# Patient Record
Sex: Female | Born: 1960 | Race: White | Hispanic: No | Smoking: Never smoker
Health system: Southern US, Community
[De-identification: ages and names within clinical notes are randomized; demographics above are authoritative.]

## PROBLEM LIST (undated history)

## (undated) DIAGNOSIS — I951 Orthostatic hypotension: Secondary | ICD-10-CM

## (undated) DIAGNOSIS — M7542 Impingement syndrome of left shoulder: Secondary | ICD-10-CM

## (undated) DIAGNOSIS — F32A Depression, unspecified: Secondary | ICD-10-CM

## (undated) DIAGNOSIS — I471 Supraventricular tachycardia, unspecified: Secondary | ICD-10-CM

## (undated) DIAGNOSIS — C801 Malignant (primary) neoplasm, unspecified: Secondary | ICD-10-CM

## (undated) DIAGNOSIS — K219 Gastro-esophageal reflux disease without esophagitis: Secondary | ICD-10-CM

## (undated) DIAGNOSIS — F329 Major depressive disorder, single episode, unspecified: Secondary | ICD-10-CM

## (undated) DIAGNOSIS — M1990 Unspecified osteoarthritis, unspecified site: Secondary | ICD-10-CM

## (undated) DIAGNOSIS — M720 Palmar fascial fibromatosis [Dupuytren]: Secondary | ICD-10-CM

## (undated) DIAGNOSIS — R0789 Other chest pain: Secondary | ICD-10-CM

## (undated) DIAGNOSIS — T50902A Poisoning by unspecified drugs, medicaments and biological substances, intentional self-harm, initial encounter: Secondary | ICD-10-CM

## (undated) DIAGNOSIS — I81 Portal vein thrombosis: Secondary | ICD-10-CM

## (undated) DIAGNOSIS — R55 Syncope and collapse: Secondary | ICD-10-CM

## (undated) HISTORY — PX: APPENDECTOMY: SHX54

## (undated) HISTORY — DX: Depression, unspecified: F32.A

## (undated) HISTORY — DX: Other chest pain: R07.89

## (undated) HISTORY — DX: Portal vein thrombosis: I81

## (undated) HISTORY — PX: COLONOSCOPY: SHX174

## (undated) HISTORY — DX: Syncope and collapse: R55

## (undated) HISTORY — DX: Supraventricular tachycardia: I47.1

## (undated) HISTORY — PX: TONSILLECTOMY: SUR1361

## (undated) HISTORY — DX: Supraventricular tachycardia, unspecified: I47.10

## (undated) HISTORY — PX: FOOT SURGERY: SHX648

## (undated) HISTORY — PX: SHOULDER SURGERY: SHX246

## (undated) HISTORY — DX: Palmar fascial fibromatosis (dupuytren): M72.0

## (undated) HISTORY — DX: Malignant (primary) neoplasm, unspecified: C80.1

## (undated) HISTORY — DX: Orthostatic hypotension: I95.1

## (undated) HISTORY — DX: Unspecified osteoarthritis, unspecified site: M19.90

## (undated) HISTORY — DX: Major depressive disorder, single episode, unspecified: F32.9

---

## 1998-02-20 HISTORY — PX: ABDOMINAL HYSTERECTOMY: SHX81

## 2000-03-30 ENCOUNTER — Other Ambulatory Visit: Admission: RE | Admit: 2000-03-30 | Discharge: 2000-03-30 | Payer: Self-pay | Admitting: Obstetrics and Gynecology

## 2003-01-12 ENCOUNTER — Emergency Department (HOSPITAL_COMMUNITY): Admission: EM | Admit: 2003-01-12 | Discharge: 2003-01-13 | Payer: Self-pay | Admitting: Emergency Medicine

## 2004-07-05 ENCOUNTER — Other Ambulatory Visit: Admission: RE | Admit: 2004-07-05 | Discharge: 2004-07-05 | Payer: Self-pay | Admitting: Obstetrics and Gynecology

## 2005-05-31 ENCOUNTER — Emergency Department (HOSPITAL_COMMUNITY): Admission: EM | Admit: 2005-05-31 | Discharge: 2005-06-01 | Payer: Self-pay | Admitting: Emergency Medicine

## 2006-04-05 ENCOUNTER — Emergency Department (HOSPITAL_COMMUNITY): Admission: EM | Admit: 2006-04-05 | Discharge: 2006-04-06 | Payer: Self-pay | Admitting: Emergency Medicine

## 2006-09-01 ENCOUNTER — Emergency Department (HOSPITAL_COMMUNITY): Admission: EM | Admit: 2006-09-01 | Discharge: 2006-09-01 | Payer: Self-pay | Admitting: Emergency Medicine

## 2009-10-04 ENCOUNTER — Emergency Department (HOSPITAL_COMMUNITY): Admission: EM | Admit: 2009-10-04 | Discharge: 2009-10-04 | Payer: Self-pay | Admitting: Emergency Medicine

## 2009-12-10 ENCOUNTER — Ambulatory Visit (HOSPITAL_COMMUNITY): Admission: RE | Admit: 2009-12-10 | Discharge: 2009-12-10 | Payer: Self-pay | Admitting: Emergency Medicine

## 2009-12-10 ENCOUNTER — Encounter: Payer: Self-pay | Admitting: Emergency Medicine

## 2009-12-10 ENCOUNTER — Ambulatory Visit: Payer: Self-pay | Admitting: Vascular Surgery

## 2010-08-11 ENCOUNTER — Encounter: Payer: Self-pay | Admitting: Family Medicine

## 2010-08-11 ENCOUNTER — Ambulatory Visit (INDEPENDENT_AMBULATORY_CARE_PROVIDER_SITE_OTHER): Payer: BC Managed Care – PPO | Admitting: Family Medicine

## 2010-08-11 DIAGNOSIS — M79671 Pain in right foot: Secondary | ICD-10-CM | POA: Insufficient documentation

## 2010-08-11 DIAGNOSIS — M79609 Pain in unspecified limb: Secondary | ICD-10-CM

## 2010-08-11 DIAGNOSIS — C539 Malignant neoplasm of cervix uteri, unspecified: Secondary | ICD-10-CM

## 2010-08-11 DIAGNOSIS — M2669 Other specified disorders of temporomandibular joint: Secondary | ICD-10-CM

## 2010-08-11 DIAGNOSIS — F32A Depression, unspecified: Secondary | ICD-10-CM | POA: Insufficient documentation

## 2010-08-11 DIAGNOSIS — G43109 Migraine with aura, not intractable, without status migrainosus: Secondary | ICD-10-CM

## 2010-08-11 DIAGNOSIS — R29898 Other symptoms and signs involving the musculoskeletal system: Secondary | ICD-10-CM

## 2010-08-11 DIAGNOSIS — M549 Dorsalgia, unspecified: Secondary | ICD-10-CM

## 2010-08-11 DIAGNOSIS — G43909 Migraine, unspecified, not intractable, without status migrainosus: Secondary | ICD-10-CM | POA: Insufficient documentation

## 2010-08-11 DIAGNOSIS — F329 Major depressive disorder, single episode, unspecified: Secondary | ICD-10-CM

## 2010-08-11 DIAGNOSIS — G4700 Insomnia, unspecified: Secondary | ICD-10-CM | POA: Insufficient documentation

## 2010-08-11 NOTE — Patient Instructions (Signed)
Don't change your meds for now.  I'll look over your records from Dr. Lajoyce Corners, Dr. Mila Homer and the [place].  Take care.

## 2010-08-12 ENCOUNTER — Encounter: Payer: Self-pay | Admitting: Family Medicine
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
## 2010-08-12 NOTE — Assessment & Plan Note (Signed)
Jaw rest, no other intervention.

## 2010-08-12 NOTE — Assessment & Plan Note (Signed)
Per psych, okay for outpatient f/u .

## 2010-08-12 NOTE — Assessment & Plan Note (Signed)
D/w pt. She was prev on an unrecalled med, she'll check on this.  Now with 1-2 HA per month, which is improved.

## 2010-08-12 NOTE — Assessment & Plan Note (Signed)
Per psych 

## 2010-08-12 NOTE — Assessment & Plan Note (Signed)
Per ortho.  

## 2010-11-22 ENCOUNTER — Encounter: Payer: Self-pay | Admitting: Family Medicine

## 2010-11-22 ENCOUNTER — Ambulatory Visit (INDEPENDENT_AMBULATORY_CARE_PROVIDER_SITE_OTHER): Payer: BC Managed Care – PPO | Admitting: Family Medicine

## 2010-11-22 VITALS — BP 120/70 | HR 80 | Temp 98.4°F | Ht 67.0 in | Wt 169.0 lb

## 2010-11-22 DIAGNOSIS — M7500 Adhesive capsulitis of unspecified shoulder: Secondary | ICD-10-CM

## 2010-11-22 MED ORDER — DICLOFENAC SODIUM 75 MG PO TBEC: 75.0000 mg | DELAYED_RELEASE_TABLET | Freq: Two times a day (BID) | ORAL | Status: DC

## 2010-11-22 MED ORDER — TRAMADOL HCL 50 MG PO TABS: 50.0000 mg | ORAL_TABLET | Freq: Four times a day (QID) | ORAL | Status: AC | PRN

## 2010-11-22 NOTE — Patient Instructions (Signed)
Recheck 6 weeks  REFERRAL: GO THE THE FRONT ROOM AT THE ENTRANCE OF OUR CLINIC, NEAR CHECK IN. ASK FOR MARION. SHE WILL HELP YOU SET UP YOUR REFERRAL. DATE: TIME:

## 2010-11-22 NOTE — Progress Notes (Signed)
  Subjective:    Patient ID: April Stephenson, female    DOB: 05-22-1960, 50 y.o.   MRN: [id number]  HPI  April Stephenson, a 50 y.o. female presents today in the office for the following:    08/2010 -- was having some problems with her left shoulder. Went to UC.  Has been getting worse since then -- has a sensation of subluxation.  Trouble lifting her arm up.   The other one is starting to do the same thing. Lifting her shoulder will hurt.  Gave some mobic at [facility] and also went on two weeks of vacation.   And went tubing and got a concussion and injured her left finger at the time.   He is primarily left greater than right shoulder. Notable restriction in motion with abduction, flexion, and mostly with internal range of motion, but also some with external rotation. She is having pain at night, and pain is waking her up at night.  The PMH, PSH, Social History, Family History, Medications, and allergies have been reviewed in [facility], and have been updated if relevant.   Review of Systems REVIEW OF SYSTEMS  GEN: No fevers, chills. Nontoxic. Primarily MSK c/o today. MSK: Detailed in the HPI GI: tolerating PO intake without difficulty Neuro: No numbness, parasthesias, or tingling associated. Otherwise the pertinent positives of the ROS are noted above.      Objective:   Physical Exam  Physical Exam  Blood pressure 120/70, pulse 80, temperature 98.4 F (36.9 C), temperature source Oral, height 5\' 7"  (1.702 m), weight 169 lb (76.658 kg), SpO2 97.00%.  GEN: Well-developed,well-nourished,in no acute distress; alert,appropriate and cooperative throughout examination HEENT: Normocephalic and atraumatic without obvious abnormalities. Ears, externally no deformities PULM: Breathing comfortably in no respiratory distress EXT: No clubbing, cyanosis, or edema PSYCH: Normally interactive. Cooperative during the interview. Pleasant. Friendly and conversant. Not anxious or depressed appearing.  Normal, full affect.  Shoulder: L Ecchymosis/edema: neg  AC joint, scapula, clavicle: NT Cervical spine: NT Abduction: 5/5, loss of 20 Flexion: 5/5, loss of 40 IR done at 90 degrees of abduction, 5/5: none ER done at 90 degrees of aduction: 5/5: 80 Special testing limited by lack of motion Drop Test: neg Supraspinatus insertion: NT Bicipital groove: NT C5-T1 intact Sensation intact Grip 5/5       Assessment & Plan:   1. Frozen shoulder  diclofenac (VOLTAREN) 75 MG EC tablet, traMADol (ULTRAM) 50 MG tablet, Ambulatory referral to Physical Therapy    Diagnosis: adhesive capsulitis  Patient was given a systematic ROM protocol from [facility] to be done daily. Emphasized that without adherence to HEP, likely will not get better.  Tylenol or NSAID of choice prn for pain relief  Patient will be sent for formal PT for aggressive frozen shoulder ROM. Will need RTC str and scapular stabilization to fix underlying mechanics.  If does poorly, intraarticular corticosteroid injections in Adhesive Capsulitis has clearly been shown to be of benefit - she is reluctant right now, and reasonable to cont with HEP, PT, meds.

## 2010-11-28 ENCOUNTER — Telehealth: Payer: Self-pay | Admitting: Family Medicine

## 2010-11-28 NOTE — Telephone Encounter (Signed)
Left message on patient's VM asking her to return the call.

## 2010-11-28 NOTE — Telephone Encounter (Signed)
Please notify pt that I still haven't gotten records from Dr. Lajoyce Corners, Dr. Mila Homer and the [place].  See if these can be obtained. Thanks.

## 2010-11-30 NOTE — Telephone Encounter (Signed)
Left message for patient to return call on home #.

## 2010-12-02 ENCOUNTER — Encounter: Payer: Self-pay | Admitting: *Deleted

## 2010-12-02 NOTE — Telephone Encounter (Signed)
Letter mailed to patient.

## 2010-12-07 ENCOUNTER — Telehealth: Payer: Self-pay | Admitting: *Deleted

## 2010-12-07 NOTE — Telephone Encounter (Signed)
Please call in  Vicodin 5-500, 1 tab q 4-6 hours prn pain. #50. 0 refills.  Put in chart. Cannot take and drive

## 2010-12-07 NOTE — Telephone Encounter (Signed)
Pt was given tramadol on 10/2 for frozen shoulder.  She has already used this up and she is asking if she can have something a little stronger called to [address].  She is still in a lot of pain, pain is unbearable when getting therapy and afterwards.  She cant sleep at night, or lay down, she sleeps in a recliner.

## 2010-12-08 MED ORDER — HYDROCODONE-ACETAMINOPHEN 5-500 MG PO TABS: 1.0000 | ORAL_TABLET | Freq: Four times a day (QID) | ORAL | Status: DC | PRN

## 2010-12-08 NOTE — Telephone Encounter (Signed)
Patient advised and rx called to pharmacy  

## 2010-12-21 ENCOUNTER — Encounter: Payer: Self-pay | Admitting: Family Medicine

## 2010-12-21 ENCOUNTER — Other Ambulatory Visit: Payer: Self-pay | Admitting: *Deleted

## 2010-12-21 ENCOUNTER — Ambulatory Visit (INDEPENDENT_AMBULATORY_CARE_PROVIDER_SITE_OTHER)
Admission: RE | Admit: 2010-12-21 | Discharge: 2010-12-21 | Disposition: A | Discharge status: Home / Self Care | Payer: BC Managed Care – PPO | Source: Ambulatory Visit | Attending: Family Medicine | Admitting: Family Medicine

## 2010-12-21 ENCOUNTER — Ambulatory Visit (INDEPENDENT_AMBULATORY_CARE_PROVIDER_SITE_OTHER): Payer: BC Managed Care – PPO | Admitting: Family Medicine

## 2010-12-21 VITALS — BP 120/70 | HR 76 | Temp 97.8°F | Ht 67.0 in | Wt 167.8 lb

## 2010-12-21 DIAGNOSIS — M25519 Pain in unspecified shoulder: Secondary | ICD-10-CM

## 2010-12-21 DIAGNOSIS — M25512 Pain in left shoulder: Secondary | ICD-10-CM

## 2010-12-21 DIAGNOSIS — M7500 Adhesive capsulitis of unspecified shoulder: Secondary | ICD-10-CM

## 2010-12-21 MED ORDER — HYDROCODONE-ACETAMINOPHEN 5-500 MG PO TABS: 1.0000 | ORAL_TABLET | Freq: Four times a day (QID) | ORAL | Status: DC | PRN

## 2010-12-21 NOTE — Patient Instructions (Signed)
REFERRAL: GO THE THE FRONT ROOM AT THE ENTRANCE OF OUR CLINIC, NEAR CHECK IN. ASK FOR MARION. SHE WILL HELP YOU SET UP YOUR REFERRAL. DATE: TIME:  

## 2010-12-21 NOTE — Telephone Encounter (Signed)
Ok to refill #50, 0 refills

## 2010-12-21 NOTE — Telephone Encounter (Signed)
rx called to pharmacy 

## 2010-12-21 NOTE — Progress Notes (Signed)
Subjective:    Patient ID: April Stephenson, female    DOB: [birth year], 50 y.o.   MRN: [id number]  HPI  April Stephenson, a 50 y.o. female presents today in the office for the following:    The patient is following up for left-sided greater than right-sided shoulder pain. She did sustain an injury possibly when she was tubing in July, 2012. She did fracture her finger at that time and had a significant concussion. Her memory surrounding that time is somewhat hazy, but afterwards she did develop some shoulder pain and restriction in motion. Last time when I saw her, we felt that she was having some of these capsulitis, initiated some range of motion. She would've formal physical therapy a few times, but has had some significant pain laterally, requiring Vicodin a couple times a day. Restriction in motion persists on the left side, and she is having some beginnings of pain with abduction and restriction in internal range of motion on the right.  Adhesive capsulitis -  L - f/u. Now a little discomfort on R  11/22/2010 OV: 08/2010 -- was having some problems with her left shoulder. Went to UC.   Has been getting worse since then -- has a sensation of subluxation.   Trouble lifting her arm up.   The other one is starting to do the same thing. Lifting her shoulder will hurt.   Gave some mobic at [facility] and also went on two weeks of vacation.   And went tubing and got a concussion and injured her left finger at the time.   He is primarily left greater than right shoulder. Notable restriction in motion with abduction, flexion, and mostly with internal range of motion, but also some with external rotation. She is having pain at night, and pain is waking her up at night.   The PMH, PSH, Social History, Family History, Medications, and allergies have been reviewed in [facility], and have been updated if relevant.   Review of Systems REVIEW OF SYSTEMS  GEN: No fevers, chills. Nontoxic. Primarily MSK c/o  today. MSK: Detailed in the HPI GI: tolerating PO intake without difficulty Neuro: No numbness, parasthesias, or tingling associated. Otherwise the pertinent positives of the ROS are noted above.      Objective:   Physical Exam   GEN: Well-developed,well-nourished,in no acute distress; alert,appropriate and cooperative throughout examination HEENT: Normocephalic and atraumatic without obvious abnormalities. Ears, externally no deformities PULM: Breathing comfortably in no respiratory distress EXT: No clubbing, cyanosis, or edema PSYCH: Normally interactive. Cooperative during the interview. Pleasant. Friendly and conversant. Not anxious or depressed appearing. Normal, full affect.  Shoulder: L Ecchymosis/edema: neg   AC joint, scapula, clavicle: NT Cervical spine: NT Abduction: 4/5, worsening to 110 degrees Flexion: 4+/5, loss of 80 deg IR done at 90 degrees of abduction, 5/5: none ER done at 90 degrees of aduction: 5/5: 30 degrees Special testing limited by lack of motion Drop Test: neg Supraspinatus insertion: NT Bicipital groove: NT C5-T1 intact Sensation intact Grip 5/5     Assessment & Plan:   1. Left shoulder pain  DG Shoulder Left, MR Shoulder Left Wo Contrast    Persistent left-sided sure pain. Worsening with regards to range of motion and progressing presumed adhesive capsulitis. Clinical concern given concussion and potential injury in July, 2012 and limitation in muscle function at the rotator cuff the patient has sustained a rotator cuff tear. Will obtain plain films, then obtained an MRI of the left shoulder to evaluate  for rotator cuff tear.  She's also having some mild degree of impingement and relative loss of internal range of motion on the right side.

## 2010-12-23 ENCOUNTER — Ambulatory Visit
Admission: RE | Admit: 2010-12-23 | Discharge: 2010-12-23 | Disposition: A | Discharge status: Home / Self Care | Payer: BC Managed Care – PPO | Source: Ambulatory Visit | Attending: Family Medicine | Admitting: Family Medicine

## 2010-12-23 DIAGNOSIS — M25512 Pain in left shoulder: Secondary | ICD-10-CM

## 2010-12-26 ENCOUNTER — Other Ambulatory Visit: Payer: Self-pay | Admitting: Family Medicine

## 2010-12-26 DIAGNOSIS — M7500 Adhesive capsulitis of unspecified shoulder: Secondary | ICD-10-CM

## 2010-12-26 DIAGNOSIS — M75100 Unspecified rotator cuff tear or rupture of unspecified shoulder, not specified as traumatic: Secondary | ICD-10-CM

## 2010-12-26 DIAGNOSIS — S43439A Superior glenoid labrum lesion of unspecified shoulder, initial encounter: Secondary | ICD-10-CM

## 2011-01-02 ENCOUNTER — Other Ambulatory Visit: Payer: Self-pay | Admitting: *Deleted

## 2011-01-02 DIAGNOSIS — M7500 Adhesive capsulitis of unspecified shoulder: Secondary | ICD-10-CM

## 2011-01-02 MED ORDER — DICLOFENAC SODIUM 75 MG PO TBEC: 75.0000 mg | DELAYED_RELEASE_TABLET | Freq: Two times a day (BID) | ORAL | Status: DC

## 2011-01-02 MED ORDER — HYDROCODONE-ACETAMINOPHEN 5-500 MG PO TABS: 1.0000 | ORAL_TABLET | Freq: Four times a day (QID) | ORAL | Status: DC | PRN

## 2011-01-02 NOTE — Telephone Encounter (Signed)
Medication phoned to pharmacy.  

## 2011-01-02 NOTE — Telephone Encounter (Signed)
Pt is traveling through [place] and has run out of her vicodin and diclofenac.  She will be returning home tomorrow, but is in a lot of pain and really needs these meds.  She wants these sent to [facility] in [place], on [address] in [place], [place], [address].  Phone number is [phone].

## 2011-01-02 NOTE — Telephone Encounter (Signed)
Please call in.  See phone info below.  Thanks.

## 2011-01-09 ENCOUNTER — Encounter: Payer: Self-pay | Admitting: Family Medicine

## 2011-01-09 ENCOUNTER — Ambulatory Visit (INDEPENDENT_AMBULATORY_CARE_PROVIDER_SITE_OTHER): Payer: BC Managed Care – PPO | Admitting: Family Medicine

## 2011-01-09 DIAGNOSIS — M75102 Unspecified rotator cuff tear or rupture of left shoulder, not specified as traumatic: Secondary | ICD-10-CM

## 2011-01-09 DIAGNOSIS — S43429A Sprain of unspecified rotator cuff capsule, initial encounter: Secondary | ICD-10-CM

## 2011-01-09 DIAGNOSIS — M7500 Adhesive capsulitis of unspecified shoulder: Secondary | ICD-10-CM

## 2011-01-09 MED ORDER — KETOROLAC TROMETHAMINE 60 MG/2ML IM SOLN
60.0000 mg | Freq: Once | INTRAMUSCULAR | Status: AC
  Administered 2011-01-09: 60 mg via INTRAMUSCULAR

## 2011-01-09 NOTE — Patient Instructions (Signed)
REFERRAL: GO THE THE FRONT ROOM AT THE ENTRANCE OF OUR CLINIC, NEAR CHECK IN. ASK FOR MARION. SHE WILL HELP YOU SET UP YOUR REFERRAL. DATE: TIME:  

## 2011-01-10 NOTE — Progress Notes (Signed)
  Patient Name: April Stephenson Date of Birth: [birth year] Age: 50 y.o. Medical Record Number: [id number] Gender: female  History of Present Illness:  April Stephenson is a 58 y.o. very pleasant female patient who presents with the following:  The patient is following up with me with some questions regarding her left shoulder. I have counseled Dr. Dion Saucier for his assistance in expertise in this case. The patient has a high-grade supraspinatus tear, a partial biceps tendon tear, possibly some labral pathology, and a frozen shoulder. Previously, reviewed his consult note, I tried to go over her MRI with her, and explain everything to her so that she could understand well.  Past Medical History, Surgical History, Social History, Family History, and Problem List have been reviewed in EHR and updated if relevant.  Review of Systems:  GEN: No fevers, chills. Nontoxic. Primarily MSK c/o today. MSK: Detailed in the HPI GI: tolerating PO intake without difficulty Neuro: No numbness, parasthesias, or tingling associated. Otherwise the pertinent positives of the ROS are noted above.    Physical Examination: Filed Vitals:   01/09/11 1528  BP: 120/78  Pulse: 84  Temp: 97.8 F (36.6 C)  TempSrc: Oral  Height: 5\' 7"  (1.702 m)  Weight: 171 lb 12.8 oz (77.928 kg)  SpO2: 98%     GEN: WDWN, NAD, Non-toxic, Alert & Oriented x 3 HEENT: Atraumatic, Normocephalic.  Ears and Nose: No external deformity. EXTR: No clubbing/cyanosis/edema NEURO: Normal gait.  PSYCH: Normally interactive. Conversant. Not depressed or anxious appearing.  Calm demeanor.   Left shoulder: Active range of motion to 100 of abduction. Significant pain. Flexion to 100. 75 loss of external range of motion. Effectively no interval range of motion at abduction of 90. Strength is 4 minus/5 in abduction. Internal and external rotation are 5/5.  Assessment and Plan: Clinically and radiographically consistent with rotator cuff  tear and adhesive capsulitis. Complex case. I have orientable to one of the shoulder surgeons in our area for his expertise, and consideration of operative management is appropriate. Discussed that the patient, it is also reasonable to continue with some physical therapy, and see how she does from a conservative standpoint. She did have a subacromial injection a week or 2 ago, and had effectively no relief from that.  MRI personally reviewed in the office and reviewed with the patient

## 2011-01-20 ENCOUNTER — Ambulatory Visit (INDEPENDENT_AMBULATORY_CARE_PROVIDER_SITE_OTHER): Payer: BC Managed Care – PPO | Admitting: Family Medicine

## 2011-01-20 ENCOUNTER — Encounter: Payer: Self-pay | Admitting: Family Medicine

## 2011-01-20 DIAGNOSIS — J329 Chronic sinusitis, unspecified: Secondary | ICD-10-CM

## 2011-01-20 MED ORDER — AMOXICILLIN 875 MG PO TABS: 875.0000 mg | ORAL_TABLET | Freq: Two times a day (BID) | ORAL | Status: AC

## 2011-01-20 NOTE — Progress Notes (Signed)
duration of symptoms: 2-3 days Rhinorrhea: yes Congestion: yes ear pain: yes sore throat:yes cough:yes Myalgias: yes other concerns:cough and tooth pain, fever and change in equilibrium, HA  ROS: See HPI.  Otherwise negative.    Meds, vitals, and allergies reviewed.   GEN: nad, alert and oriented HEENT: mucous membranes moist, TM w/o erythema, nasal epithelium injected, OP with cobblestoning, max sinus ttp L>R NECK: supple w/o LA CV: rrr. PULM: ctab, no inc wob ABD: soft, +bs EXT: no edema

## 2011-01-20 NOTE — Patient Instructions (Signed)
Start the antibiotics today.  Drink plenty of fluids, take tylenol as needed, and gargle with warm salt water for your throat.  This should gradually improve.  Take care.  Let us know if you have other concerns.  

## 2011-01-24 ENCOUNTER — Encounter: Payer: Self-pay | Admitting: Family Medicine

## 2011-01-24 DIAGNOSIS — J329 Chronic sinusitis, unspecified: Secondary | ICD-10-CM | POA: Insufficient documentation

## 2011-01-24 NOTE — Assessment & Plan Note (Signed)
Amoxil, supportive tx and f/u prn.  She agrees.

## 2011-02-15 ENCOUNTER — Emergency Department: Payer: Self-pay | Admitting: Emergency Medicine

## 2011-03-01 ENCOUNTER — Telehealth: Payer: Self-pay | Admitting: Internal Medicine

## 2011-03-01 NOTE — Telephone Encounter (Signed)
Noted  

## 2011-03-01 NOTE — Telephone Encounter (Signed)
Rosalita Chessman from [facility] called and said she had come for three visit and on third visit she says the home exercise program was making her worse and they changed it.  They think what happened was when they sent her a $25 bill that she sent them a letter and stated that therapy was making her pain worse and she had told you and you referred her to someone else.  They just wanted to let you know because they enjoy working with your patients.

## 2011-03-19 ENCOUNTER — Encounter: Payer: Self-pay | Admitting: Family Medicine

## 2011-03-28 ENCOUNTER — Ambulatory Visit (INDEPENDENT_AMBULATORY_CARE_PROVIDER_SITE_OTHER): Payer: BC Managed Care – PPO | Admitting: Family Medicine

## 2011-03-28 DIAGNOSIS — G43909 Migraine, unspecified, not intractable, without status migrainosus: Secondary | ICD-10-CM

## 2011-03-28 MED ORDER — KETOROLAC TROMETHAMINE 60 MG/2ML IM SOLN
60.0000 mg | Freq: Once | INTRAMUSCULAR | Status: AC
  Administered 2011-03-28: 60 mg via INTRAMUSCULAR

## 2011-03-28 MED ORDER — HYDROCODONE-ACETAMINOPHEN 5-500 MG PO TABS: 1.0000 | ORAL_TABLET | Freq: Four times a day (QID) | ORAL | Status: DC | PRN

## 2011-03-28 NOTE — Progress Notes (Signed)
  Patient Name: April Stephenson Date of Birth: 1960-04-23 Medical Record Number: [id number] Gender: female Date of Encounter: 03/28/2011  History of Present Illness:  April Stephenson is a 51 y.o. very pleasant female patient who presents with the following:  Typical migraine HA for the last 2 days.   Does have aura- difficulty with vision and photophobia.  No phonophobia this time, nausea but no vomiting.  Took some phenergan at home already.  Otherwise no acute illness symtoms except fatigue.  Has done well with toradol in the past, also needs a RF of vicodin for PRN use.    Pt notes that voltaren has caused nausea in the past (not blackouts) but that she does ok with Toradol and has received Toradol for HA in the past with good results.   I corrected the history of "blackouts" with toradol in her allergy history after discussion with patient  Patient Active Problem List  Diagnoses  . Depression  . Back pain  . Migraine with aura  . TMJ click  . Foot pain, right  . Cervical cancer  . Insomnia  . Frozen shoulder  . Sinusitis   Past Medical History  Diagnosis Date  . Migraine with aura   . Cancer     cervical cancer  . Depression     per Dr. Mila Homer with psych, h/o suicide attempt at 51 y/o  . Arthritis     with longstanding foot and back pain after injuries, followed by Dr. Lajoyce Corners   Past Surgical History  Procedure Date  . Tonsillectomy   . Foot surgery     R foot  . Abdominal hysterectomy     TAH/BSO   History  Substance Use Topics  . Smoking status: Never Smoker   . Smokeless tobacco: Not on file  . Alcohol Use: Not on file   Family History  Problem Relation Age of Onset  . Cancer Mother   . Depression Mother   . Cancer Father   . Heart disease Father    Allergies  Allergen Reactions  . Voltaren Other (See Comments)    Blackouts  . Venlafaxine     syncope    Medication list has been reviewed and updated.  Review of Systems: ROS negative except as per  HPI  Physical Examination: Filed Vitals:   03/28/11 1900  BP: 116/78  Pulse: 74  Temp: 98 F (36.7 C)  TempSrc: Oral  Resp: 16    There is no height or weight on file to calculate BMI.  GEN: WDWN, NAD, Non-toxic, A & O x 3 HEENT: Atraumatic, Normocephalic. Neck supple. No masses, No LAD. PEERLA, EONMI Ears and Nose: No external deformity. CV: RRR, No M/G/R. No JVD. No thrill. No extra heart sounds. PULM: CTA B, no wheezes, crackles, rhonchi. No retractions. No resp. distress. No accessory muscle use. EXTR: No c/c/e NEURO Normal gait.   DTR normal biceps and patellar, normal strength and sensation upper and lower extr.  Cerebellar function normal.  Did complete neuro exam PSYCH: Normally interactive. Conversant. Not depressed or anxious appearing.  Calm demeanor.   Assessment and Plan: 1. Migraine  ketorolac (TORADOL) injection 60 mg   Gave IM toradol for migraine as has been helpful in the past.  Also gave RF of vicodin. Patient (or parent if minor) instructed to return to clinic or call if not better in 1 day(s). Sooner if worse.

## 2011-05-04 ENCOUNTER — Emergency Department (HOSPITAL_COMMUNITY)

## 2011-05-04 ENCOUNTER — Emergency Department (HOSPITAL_COMMUNITY)
Admission: EM | Admit: 2011-05-04 | Discharge: 2011-05-04 | Disposition: A | Discharge status: Home / Self Care | Attending: Emergency Medicine | Admitting: Emergency Medicine

## 2011-05-04 ENCOUNTER — Encounter (HOSPITAL_COMMUNITY): Payer: Self-pay | Admitting: Emergency Medicine

## 2011-05-04 DIAGNOSIS — F3289 Other specified depressive episodes: Secondary | ICD-10-CM | POA: Insufficient documentation

## 2011-05-04 DIAGNOSIS — S4990XA Unspecified injury of shoulder and upper arm, unspecified arm, initial encounter: Secondary | ICD-10-CM

## 2011-05-04 DIAGNOSIS — S4980XA Other specified injuries of shoulder and upper arm, unspecified arm, initial encounter: Secondary | ICD-10-CM | POA: Insufficient documentation

## 2011-05-04 DIAGNOSIS — M24019 Loose body in unspecified shoulder: Secondary | ICD-10-CM | POA: Insufficient documentation

## 2011-05-04 DIAGNOSIS — S46909A Unspecified injury of unspecified muscle, fascia and tendon at shoulder and upper arm level, unspecified arm, initial encounter: Secondary | ICD-10-CM | POA: Insufficient documentation

## 2011-05-04 DIAGNOSIS — F329 Major depressive disorder, single episode, unspecified: Secondary | ICD-10-CM | POA: Insufficient documentation

## 2011-05-04 DIAGNOSIS — Z8739 Personal history of other diseases of the musculoskeletal system and connective tissue: Secondary | ICD-10-CM | POA: Insufficient documentation

## 2011-05-04 DIAGNOSIS — M2400 Loose body in unspecified joint: Secondary | ICD-10-CM

## 2011-05-04 DIAGNOSIS — Z79899 Other long term (current) drug therapy: Secondary | ICD-10-CM | POA: Insufficient documentation

## 2011-05-04 DIAGNOSIS — X58XXXA Exposure to other specified factors, initial encounter: Secondary | ICD-10-CM | POA: Insufficient documentation

## 2011-05-04 MED ORDER — HYDROCODONE-ACETAMINOPHEN 5-325 MG PO TABS: 1.0000 | ORAL_TABLET | Freq: Four times a day (QID) | ORAL | Status: AC | PRN

## 2011-05-04 NOTE — ED Notes (Signed)
PT. REPROTS PAIN AT RIGHT SHOULDER INJURED WHILE PULLING A MOG BUCKET THIS MORNING WHILE WORKING AT [employer], PAIN WORSE WITH MOVEMENT /RADIATING TO BACK.

## 2011-05-04 NOTE — ED Provider Notes (Signed)
History     CSN: [id number]  Arrival date & time 05/04/11  0409   First MD Initiated Contact with Patient 05/04/11 [phone]      Chief Complaint  Patient presents with  . Shoulder Pain    (Consider location/radiation/quality/duration/timing/severity/associated sxs/prior treatment) Patient is a 51 y.o. female presenting with shoulder injury. The history is provided by the patient.  Shoulder Injury This is a new problem. The current episode started 1 to 2 hours ago. The problem occurs constantly. The problem has not changed since onset.Pertinent negatives include no chest pain, no abdominal pain, no headaches and no shortness of breath. The symptoms are aggravated by nothing. The symptoms are relieved by nothing. She has tried nothing for the symptoms. The treatment provided no relief.    Past Medical History  Diagnosis Date  . Migraine with aura   . Cancer     cervical cancer  . Depression     per Dr. Mila Homer with psych, h/o suicide attempt at 51 y/o  . Arthritis     with longstanding foot and back pain after injuries, followed by Dr. Lajoyce Corners    Past Surgical History  Procedure Date  . Tonsillectomy   . Foot surgery     R foot  . Abdominal hysterectomy     TAH/BSO    Family History  Problem Relation Age of Onset  . Cancer Mother   . Depression Mother   . Cancer Father   . Heart disease Father     History  Substance Use Topics  . Smoking status: Never Smoker   . Smokeless tobacco: Not on file  . Alcohol Use: Yes    OB History    Grav Para Term Preterm Abortions TAB SAB Ect Mult Living                  Review of Systems  Constitutional: Negative.   HENT: Negative.   Eyes: Negative.   Respiratory: Negative for shortness of breath.   Cardiovascular: Negative for chest pain.  Gastrointestinal: Negative.  Negative for abdominal pain.  Genitourinary: Negative.   Musculoskeletal: Negative.   Skin: Negative.   Neurological: Negative for headaches.  Hematological:  Negative.   Psychiatric/Behavioral: Negative.     Allergies  Voltaren and Venlafaxine  Home Medications   Current Outpatient Rx  Name Route Sig Dispense Refill  . ARIPIPRAZOLE 2 MG PO TABS Oral Take 2 mg by mouth daily.      Marland Kitchen DIAZEPAM 5 MG PO TABS Oral Take 5 mg by mouth every 6 (six) hours as needed. For sleep or anxiety    . ESTROGENS CONJUGATED 0.625 MG PO TABS Oral Take 0.625 mg by mouth daily.    Marland Kitchen HYDROCODONE-ACETAMINOPHEN 5-500 MG PO TABS Oral Take 1 tablet by mouth every 6 (six) hours as needed for pain. 20 tablet 0  . SERTRALINE HCL 100 MG PO TABS Oral Take 50 mg by mouth daily.     Marland Kitchen HYDROCODONE-ACETAMINOPHEN 5-325 MG PO TABS Oral Take 1 tablet by mouth every 6 (six) hours as needed for pain. 9 tablet 0    BP 140/88  Pulse 67  Temp(Src) 97.5 F (36.4 C) (Oral)  Resp 20  SpO2 99%  Physical Exam  Constitutional: She is oriented to person, place, and time. She appears well-developed and well-nourished.  HENT:  Head: Normocephalic and atraumatic.  Eyes: Conjunctivae are normal.  Neck: Normal range of motion. Neck supple.  Cardiovascular: Normal rate and regular rhythm.   Pulmonary/Chest: Effort normal  and breath sounds normal.  Abdominal: Soft. Bowel sounds are normal.  Musculoskeletal: Normal range of motion. She exhibits no tenderness.       No winging of the scapula, Negative NEERs test of the right shoulder, 5/5 RUE strength no right snuff box tenderness.  Cap refill of right hand < 2 sec intact sensation of the RUE  Neurological: She is alert and oriented to person, place, and time.  Skin: Skin is warm and dry.  Psychiatric: She has a normal mood and affect.    ED Course  Procedures (including critical care time)  Labs Reviewed - No data to display Dg Shoulder Right  05/04/2011  *RADIOLOGY REPORT*  Clinical Data: Right shoulder pain  RIGHT SHOULDER - 2+ VIEW  Comparison: Contralateral shoulder dated 12/21/2010  Findings: No fracture or dislocation. 2 mm  calcific density along the glenoid is likely a tiny bone island.  Cannot entirely exclude a small loose body.  Clavicle and acromioclavicular joint remain intact.  Upper ribs are intact.  Upper lung is clear.  IMPRESSION:  2 mm calcific density along the glenoid is likely a tiny bone island.  Cannot entirely exclude a small loose body  Otherwise, no acute osseous abnormality identified.  Original Report Authenticated By: Waneta Martins, M.D.     1. Shoulder injury   2. Joint mouse       MDM  Follow up with occ med panel and orthopedics.  Patient verbalizes understanding and agrees to follow up        Haddon Fyfe Smitty Cords, MD 05/04/11 [phone]

## 2011-05-04 NOTE — ED Notes (Signed)
Pt c/o shoulder pain s/p pulling mop bucket. Pp=strong pms intact.placed in gown

## 2011-05-04 NOTE — Discharge Instructions (Signed)
Athletic Injuries   Proper early treatment and rehabilitation leads to a quicker recovery for most athletic injuries. You may be able to return to your sport fully recovered in less time if you follow these general rules:   Rest. Rest the injury until movement is no longer painful. Using an injured joint or muscle will prolong the problem.   Elevate. Keep the injured area elevated until most of the swelling and pain are gone. If possible, keep the injured area above the level of your heart.   Ice. Use ice packs directly on the injury for 3 to 4 days.   Compression. Use an elastic bandage applied to your injury as directed. This will reduce swelling, although elastic wraps do not protect injured joints. More rigid splints and taping are better for this purpose.   Rehabilitation. This should begin as soon as the swelling and pain of your injury subside, and as directed by your caregiver. It includes exercises to improve joint motion and muscular strength. Occasionally special braces, splints, or orthotics are used to protect against further injury when you return to your sport.  Keeping a positive attitude will help you heal your injury more rapidly and completely. You may return to physical exercise that does not cause pain or increase the risk of re-injury or as directed. This will help maintain fitness. It will also improve your mental attitude. Do not overuse your injured extremity. This will lead to discomfort and may delay full recovery.   Document Released: 03/16/2004 Document Revised: 01/26/2011 Document Reviewed: 08/04/2008   [employer] Patient Information 2012 [employer].

## 2011-05-04 NOTE — ED Notes (Signed)
Pt reports moving mop bucket this morning approx at 1:30am while at work. Reports pain moving RUE outward motion. No bruising or deformity noted. CMS intact, pulses present.

## 2011-05-05 ENCOUNTER — Telehealth: Payer: Self-pay

## 2011-05-05 NOTE — Telephone Encounter (Signed)
PT WOULD LIKE SOMETHING STRONGER THAN TRAMADOL FOR HER STRAINED SHOULDER. PHARMACY: [facility] [address]

## 2011-05-06 ENCOUNTER — Telehealth: Payer: Self-pay | Admitting: Family Medicine

## 2011-05-06 NOTE — Telephone Encounter (Signed)
LMOM advising patient that she would need to come in for eval of her shoulder before any meds could be changed.  She was seen in ER for this yesterday.

## 2011-05-06 NOTE — Telephone Encounter (Signed)
Error in last message-- patient was seen here this past Wednesday for a W/C injury.  Saw Dr. Neva Seat after going to the ER and they were unable to help (they aren't contracted with post office)-- he gave her Tramadol but this has not helped with the pain.  Can we call in something else to help?

## 2011-05-07 NOTE — Telephone Encounter (Signed)
Pt has vicodin RX from 05/04/11.  This should be sufficient.  Otherwise for Dr. Neva Seat to decide.

## 2011-05-07 NOTE — Telephone Encounter (Signed)
LMOM notifying patient below info, but Vicodin rx was only for 9 pills.  Chart to MD to advise.

## 2011-05-31 ENCOUNTER — Other Ambulatory Visit: Payer: Self-pay | Admitting: Orthopedic Surgery

## 2011-06-13 ENCOUNTER — Encounter (HOSPITAL_BASED_OUTPATIENT_CLINIC_OR_DEPARTMENT_OTHER): Payer: Self-pay | Admitting: *Deleted

## 2011-06-16 ENCOUNTER — Encounter (HOSPITAL_BASED_OUTPATIENT_CLINIC_OR_DEPARTMENT_OTHER)
Admission: RE | Disposition: A | Discharge status: Home / Self Care | Payer: Self-pay | Source: Ambulatory Visit | Attending: Orthopedic Surgery

## 2011-06-16 ENCOUNTER — Encounter (HOSPITAL_BASED_OUTPATIENT_CLINIC_OR_DEPARTMENT_OTHER): Payer: Self-pay | Admitting: Anesthesiology

## 2011-06-16 ENCOUNTER — Ambulatory Visit (HOSPITAL_BASED_OUTPATIENT_CLINIC_OR_DEPARTMENT_OTHER): Payer: BC Managed Care – PPO | Admitting: Anesthesiology

## 2011-06-16 ENCOUNTER — Ambulatory Visit (HOSPITAL_BASED_OUTPATIENT_CLINIC_OR_DEPARTMENT_OTHER)
Admission: RE | Admit: 2011-06-16 | Discharge: 2011-06-16 | Disposition: A | Discharge status: Home / Self Care | Payer: BC Managed Care – PPO | Source: Ambulatory Visit | Attending: Orthopedic Surgery | Admitting: Orthopedic Surgery

## 2011-06-16 ENCOUNTER — Encounter (HOSPITAL_BASED_OUTPATIENT_CLINIC_OR_DEPARTMENT_OTHER): Payer: Self-pay | Admitting: Orthopedic Surgery

## 2011-06-16 DIAGNOSIS — M7542 Impingement syndrome of left shoulder: Secondary | ICD-10-CM | POA: Diagnosis present

## 2011-06-16 DIAGNOSIS — M719 Bursopathy, unspecified: Secondary | ICD-10-CM | POA: Insufficient documentation

## 2011-06-16 DIAGNOSIS — R51 Headache: Secondary | ICD-10-CM | POA: Insufficient documentation

## 2011-06-16 DIAGNOSIS — F3289 Other specified depressive episodes: Secondary | ICD-10-CM | POA: Insufficient documentation

## 2011-06-16 DIAGNOSIS — M7500 Adhesive capsulitis of unspecified shoulder: Secondary | ICD-10-CM | POA: Insufficient documentation

## 2011-06-16 DIAGNOSIS — F411 Generalized anxiety disorder: Secondary | ICD-10-CM | POA: Insufficient documentation

## 2011-06-16 DIAGNOSIS — M25819 Other specified joint disorders, unspecified shoulder: Secondary | ICD-10-CM | POA: Insufficient documentation

## 2011-06-16 DIAGNOSIS — M67919 Unspecified disorder of synovium and tendon, unspecified shoulder: Secondary | ICD-10-CM | POA: Insufficient documentation

## 2011-06-16 DIAGNOSIS — F329 Major depressive disorder, single episode, unspecified: Secondary | ICD-10-CM | POA: Insufficient documentation

## 2011-06-16 HISTORY — DX: Impingement syndrome of left shoulder: M75.42

## 2011-06-16 SURGERY — SHOULDER ARTHROSCOPY WITH SUBACROMIAL DECOMPRESSION
Anesthesia: General | Site: Shoulder | Wound class: Clean

## 2011-06-16 MED ORDER — OXYCODONE-ACETAMINOPHEN 5-325 MG PO TABS
1.0000 | ORAL_TABLET | Freq: Once | ORAL | Status: AC
  Administered 2011-06-16: 1 via ORAL

## 2011-06-16 MED ORDER — SODIUM CHLORIDE 0.9 % IR SOLN
Status: DC | PRN
  Administered 2011-06-16: 6000 mL

## 2011-06-16 MED ORDER — CISATRACURIUM BESYLATE 2 MG/ML IV SOLN
INTRAVENOUS | Status: DC | PRN
  Administered 2011-06-16: 14 mg via INTRAVENOUS

## 2011-06-16 MED ORDER — MIDAZOLAM HCL 2 MG/2ML IJ SOLN
1.0000 mg | INTRAMUSCULAR | Status: DC | PRN
  Administered 2011-06-16 (×2): 2 mg via INTRAVENOUS

## 2011-06-16 MED ORDER — HYDROMORPHONE HCL PF 1 MG/ML IJ SOLN: 0.2500 mg | INTRAMUSCULAR | Status: DC | PRN

## 2011-06-16 MED ORDER — FENTANYL CITRATE 0.05 MG/ML IJ SOLN
50.0000 ug | INTRAMUSCULAR | Status: DC | PRN
Start: 2011-06-16 — End: 2011-06-16

## 2011-06-16 MED ORDER — GLYCOPYRROLATE 0.2 MG/ML IJ SOLN
INTRAMUSCULAR | Status: DC | PRN
  Administered 2011-06-16: 0.4 mg via INTRAVENOUS

## 2011-06-16 MED ORDER — PROPOFOL 10 MG/ML IV EMUL
INTRAVENOUS | Status: DC | PRN
  Administered 2011-06-16: 160 mg via INTRAVENOUS

## 2011-06-16 MED ORDER — BUPIVACAINE-EPINEPHRINE PF 0.5-1:200000 % IJ SOLN
INTRAMUSCULAR | Status: DC | PRN
  Administered 2011-06-16: 25 mL

## 2011-06-16 MED ORDER — FENTANYL CITRATE 0.05 MG/ML IJ SOLN
INTRAMUSCULAR | Status: DC | PRN
  Administered 2011-06-16: 50 ug via INTRAVENOUS

## 2011-06-16 MED ORDER — CEFAZOLIN SODIUM 1-5 GM-% IV SOLN
1.0000 g | INTRAVENOUS | Status: AC
  Administered 2011-06-16: 1 g via INTRAVENOUS

## 2011-06-16 MED ORDER — METHOCARBAMOL 500 MG PO TABS: 500.0000 mg | ORAL_TABLET | Freq: Four times a day (QID) | ORAL | Status: AC

## 2011-06-16 MED ORDER — LIDOCAINE HCL (CARDIAC) 20 MG/ML IV SOLN
INTRAVENOUS | Status: DC | PRN
  Administered 2011-06-16: 50 mg via INTRAVENOUS

## 2011-06-16 MED ORDER — LORAZEPAM 2 MG/ML IJ SOLN: 1.0000 mg | Freq: Once | INTRAMUSCULAR | Status: DC | PRN

## 2011-06-16 MED ORDER — OXYCODONE-ACETAMINOPHEN 10-325 MG PO TABS: 1.0000 | ORAL_TABLET | Freq: Four times a day (QID) | ORAL | Status: AC | PRN

## 2011-06-16 MED ORDER — PROMETHAZINE HCL 25 MG PO TABS: 25.0000 mg | ORAL_TABLET | Freq: Four times a day (QID) | ORAL | Status: DC | PRN

## 2011-06-16 MED ORDER — FENTANYL CITRATE 0.05 MG/ML IJ SOLN
50.0000 ug | INTRAMUSCULAR | Status: DC | PRN
  Administered 2011-06-16: 50 ug via INTRAVENOUS
  Administered 2011-06-16: 100 ug via INTRAVENOUS

## 2011-06-16 MED ORDER — DEXAMETHASONE SODIUM PHOSPHATE 4 MG/ML IJ SOLN
INTRAMUSCULAR | Status: DC | PRN
  Administered 2011-06-16: 10 mg via INTRAVENOUS

## 2011-06-16 MED ORDER — ONDANSETRON HCL 4 MG/2ML IJ SOLN
INTRAMUSCULAR | Status: DC | PRN
  Administered 2011-06-16: 4 mg via INTRAVENOUS

## 2011-06-16 MED ORDER — LACTATED RINGERS IV SOLN
INTRAVENOUS | Status: DC
  Administered 2011-06-16 (×3): via INTRAVENOUS

## 2011-06-16 MED ORDER — MIDAZOLAM HCL 2 MG/2ML IJ SOLN: 1.0000 mg | INTRAMUSCULAR | Status: DC | PRN

## 2011-06-16 MED ORDER — NEOSTIGMINE METHYLSULFATE 1 MG/ML IJ SOLN
INTRAMUSCULAR | Status: DC | PRN
  Administered 2011-06-16: 3 mg via INTRAVENOUS

## 2011-06-16 SURGICAL SUPPLY — 64 items
APL SKNCLS STERI-STRIP NONHPOA (GAUZE/BANDAGES/DRESSINGS) ×2
BENZOIN TINCTURE PRP APPL 2/3 (GAUZE/BANDAGES/DRESSINGS) ×3 IMPLANT
BLADE 4.2CUDA (BLADE) IMPLANT
BLADE CUDA GRT WHITE 3.5 (BLADE) IMPLANT
BLADE CUTTER GATOR 3.5 (BLADE) ×3 IMPLANT
BLADE GREAT WHITE 4.2 (BLADE) IMPLANT
BLADE SURG 15 STRL LF DISP TIS (BLADE) IMPLANT
BLADE SURG 15 STRL SS (BLADE)
BUR OVAL 6.0 (BURR) IMPLANT
CANISTER OMNI JUG 16 LITER (MISCELLANEOUS) ×3 IMPLANT
CANNULA 5.75X71 LONG (CANNULA) ×2 IMPLANT
CANNULA ACUFLEX KIT 5X76 (CANNULA) IMPLANT
CANNULA TWIST IN 8.25X7CM (CANNULA) IMPLANT
CANNULA TWIST IN 8.25X9CM (CANNULA) IMPLANT
CLOTH BEACON ORANGE TIMEOUT ST (SAFETY) ×3 IMPLANT
CUTTER MENISCUS  4.2MM (BLADE)
CUTTER MENISCUS 4.2MM (BLADE) IMPLANT
DECANTER SPIKE VIAL GLASS SM (MISCELLANEOUS) IMPLANT
DRAPE INCISE IOBAN 66X45 STRL (DRAPES) ×3 IMPLANT
DRAPE SHOULDER BEACH CHAIR (DRAPES) ×3 IMPLANT
DRAPE U 20/CS (DRAPES) ×3 IMPLANT
DRAPE U-SHAPE 47X51 STRL (DRAPES) ×3 IMPLANT
DRSG PAD ABDOMINAL 8X10 ST (GAUZE/BANDAGES/DRESSINGS) ×3 IMPLANT
DURAPREP 26ML APPLICATOR (WOUND CARE) ×3 IMPLANT
ELECT REM PT RETURN 9FT ADLT (ELECTROSURGICAL)
ELECTRODE REM PT RTRN 9FT ADLT (ELECTROSURGICAL) ×1 IMPLANT
FIBERSTICK 2 (SUTURE) IMPLANT
GLOVE BIO SURGEON STRL SZ 6.5 (GLOVE) ×4 IMPLANT
GLOVE BIO SURGEON STRL SZ8 (GLOVE) ×3 IMPLANT
GLOVE BIOGEL PI IND STRL 7.0 (GLOVE) ×3 IMPLANT
GLOVE BIOGEL PI IND STRL 8 (GLOVE) ×4 IMPLANT
GLOVE BIOGEL PI INDICATOR 7.0 (GLOVE) ×3
GLOVE BIOGEL PI INDICATOR 8 (GLOVE) ×2
GLOVE ORTHO TXT STRL SZ7.5 (GLOVE) ×3 IMPLANT
GOWN PREVENTION PLUS XLARGE (GOWN DISPOSABLE) ×4 IMPLANT
GOWN PREVENTION PLUS XXLARGE (GOWN DISPOSABLE) ×5 IMPLANT
IMMOBILIZER SHOULDER XLGE (ORTHOPEDIC SUPPLIES) IMPLANT
KIT SHOULDER TRACTION (DRAPES) ×3 IMPLANT
PACK ARTHROSCOPY DSU (CUSTOM PROCEDURE TRAY) ×3 IMPLANT
PACK BASIN DAY SURGERY FS (CUSTOM PROCEDURE TRAY) ×3 IMPLANT
SET ARTHROSCOPY TUBING (MISCELLANEOUS) ×3
SET ARTHROSCOPY TUBING LN (MISCELLANEOUS) ×2 IMPLANT
SHEET MEDIUM DRAPE 40X70 STRL (DRAPES) ×3 IMPLANT
SLEEVE SCD COMPRESS KNEE MED (MISCELLANEOUS) ×3 IMPLANT
SLING ARM FOAM STRAP LRG (SOFTGOODS) ×2 IMPLANT
SLING ARM FOAM STRAP MED (SOFTGOODS) IMPLANT
SLING ARM FOAM STRAP XLG (SOFTGOODS) IMPLANT
SLING ARM IMMOBILIZER LRG (SOFTGOODS) IMPLANT
SLING ARM IMMOBILIZER MED (SOFTGOODS) IMPLANT
SPONGE GAUZE 4X4 12PLY (GAUZE/BANDAGES/DRESSINGS) ×3 IMPLANT
STRIP CLOSURE SKIN 1/2X4 (GAUZE/BANDAGES/DRESSINGS) ×3 IMPLANT
SUT FIBERWIRE #2 38 T-5 BLUE (SUTURE)
SUT MNCRL AB 4-0 PS2 18 (SUTURE) ×2 IMPLANT
SUT PDS AB 1 CT  36 (SUTURE)
SUT PDS AB 1 CT 36 (SUTURE) IMPLANT
SUT TIGER TAPE 7 IN WHITE (SUTURE) IMPLANT
SUT VIC AB 3-0 SH 27 (SUTURE)
SUT VIC AB 3-0 SH 27X BRD (SUTURE) IMPLANT
SUTURE FIBERWR #2 38 T-5 BLUE (SUTURE) IMPLANT
TOWEL OR 17X24 6PK STRL BLUE (TOWEL DISPOSABLE) ×3 IMPLANT
TOWEL OR NON WOVEN STRL DISP B (DISPOSABLE) ×3 IMPLANT
TUBE CONNECTING 20X1/4 (TUBING) ×1 IMPLANT
WAND STAR VAC 90 (SURGICAL WAND) ×3 IMPLANT
WATER STERILE IRR 1000ML POUR (IV SOLUTION) ×3 IMPLANT

## 2011-06-16 NOTE — Discharge Instructions (Addendum)
Surgery for Impingement Syndrome, Subacromial Decompression Subacromial decompression surgery is for patients with rotator cuff tendinitis, subacromial bursitis (inflamed fluid filled sac between the shoulder joint and top of the shoulder blade), or impingement syndrome (inflamed rotator cuff tendons, due to pinching). Surgery is for patients with continued shoulder pain, despite at least 3 months of rehabilitation treatment. The shoulder pain is so severe that it affects patients' daily activities or greatly decreases their quality of life. Patients who will benefit most from surgery are those whose shoulder bone (acromion) has a curve, hook, or bump (spur), or those who have a partial rotator cuff tear. There are 3 purposes of surgery. First, the inflamed bursa is removed. Second, the shoulder bone defect (curve, hook, spur) is removed. Third, the coracoacromial ligament is cut. This surgery is intended to reduce pain, by increasing space in the area, so that the rotator cuff is less likely to be pinched. REASONS NOT TO OPERATE   Infection of the shoulder joint.   Patient is unable or unwilling to complete the postoperative program. This includes keeping the shoulder in a sling or immobilizer (if open surgery is performed), or performing the needed rehabilitation.   Emotional or psychological conditions that contribute to the shoulder condition.   Patients who have rotator cuff inflammation due to other causes. This includes impingement caused by shoulder instability, weak shoulder blade muscles (scapula), shoulder arthritis, stiff or frozen shoulder, or a large os acromionale (failure of the shoulder bone growth plates to fuse properly).  RISKS AND COMPLICATIONS   Infection.   Bleeding.   Injury to nerves (numbness, weakness, paralysis).   Continued or recurring pain.   Detachment of the deltoid shoulder muscle (if open surgery is performed).   Stiffness or loss of shoulder motion.    Decrease in athletic performance.   Shoulder weakness.   Fracture of the shoulder bone.   Pain in the joint connecting the shoulder bone and collarbone.   Removal of too much or too little shoulder bone.  TECHNIQUE Technique used may vary between surgeons. In general, the surgery is performed with a flexible tube and tools inserted in a few small slits near the joint (arthroscopic). It may also be completed through an open cut (incision). The goal of the procedure is to remove the bursa, remove the shoulder bone deformity, and cut the coracoacromial ligament. Electricity will be used to sear the small capillaries (cauterize), to stop small amounts of bleeding. Other tools used are an electric or motorized shaver, to remove the bursa, and a small power drill (burr) to remove the deformity of the shoulder bone.  If the procedure is completed with an open incision, the surgeon will detach the deltoid shoulder muscle from the shoulder bone and cut the coracoacromial ligament. The deformity of the shoulder bone is then removed, using a saw or chisel (osteotome). A file (rasp) may be used to smooth the edges. Finally, the bursa is removed with scissors, and the deltoid muscle is reattached to the shoulder bone.  HOME CARE INSTRUCTIONS   Postoperative care depends on the surgical technique used (arthroscopic or open).   Follow the instructions given to you by your surgeon.   Keep the wound clean and dry for 10 to 14 days after surgery, especially if open surgery is performed.   Wear a sling, brace, or immobilizer as prescribed by your surgeon. This often lasts a couple days for arthroscopic procedures, or 6 to 8 weeks for open procedures, because the deltoid muscle must  heal.   You will be given pain medicines by your caregiver or surgeon. Take only as much medicine as you need.   You may be advised to perform motion exercises immediately after surgery. These may be performed at home or with a  therapist.   Postoperative rehabilitation and exercises are very important to regain motion, and later, strength.  RETURN TO SPORTS   6 weeks is the minimum waiting time required before returning to play. Open procedure surgeries are often longer.   Return to sports depends on the type of sport and the position played.   A therapist must assess your strength and range of motion before athletics may be resumed.  SEEK MEDICAL CARE IF:   You experience pain, numbness, or coldness in the hand.   Blue, gray, or dark color appears in the fingernails.   Any of the following occur after surgery:   Increased pain, swelling, redness, drainage of fluids, or bleeding in the affected area.   Signs of infection (headache, muscle aches, dizziness, a general ill feeling with fever).   New, unexplained symptoms develop. (Drugs used in treatment may produce side effects.)  Do not eat or drink anything before surgery. Solid food makes general anesthesia more hazardous.  Document Released: 02/06/2005 Document Revised: 01/26/2011 Document Reviewed: 05/21/2008 [facility] Patient Information 2012 [place], [place].  Call your surgeon if you experience:   1.  Fever over 101.0. 2.  Inability to urinate. 3.  Nausea and/or vomiting. 4.  Extreme swelling or bruising at the surgical site. 5.  Continued bleeding from the incision. 6.  Increased pain, redness or drainage from the incision. 7.  Problems related to your pain medication.   Post Anesthesia Home Care Instructions  Activity: Get plenty of rest for the remainder of the day. A responsible adult should stay with you for 24 hours following the procedure.  For the next 24 hours, DO NOT: -Drive a car -Advertising copywriter -Drink alcoholic beverages -Take any medication unless instructed by your physician -Make any legal decisions or sign important papers.  Meals: Start with liquid foods such as gelatin or soup. Progress to regular foods as tolerated.  Avoid greasy, spicy, heavy foods. If nausea and/or vomiting occur, drink only clear liquids until the nausea and/or vomiting subsides. Call your physician if vomiting continues.  Special Instructions/Symptoms: Your throat may feel dry or sore from the anesthesia or the breathing tube placed in your throat during surgery. If this causes discomfort, gargle with warm salt water. The discomfort should disappear within 24 hours.   Regional Anesthesia Blocks  1. Numbness or the inability to move the "blocked" extremity may last from 3-48 hours after placement. The length of time depends on the medication injected and your individual response to the medication. If the numbness is not going away after 48 hours, call your surgeon.  2. The extremity that is blocked will need to be protected until the numbness is gone and the  Strength has returned. Because you cannot feel it, you will need to take extra care to avoid injury. Because it may be weak, you may have difficulty moving it or using it. You may not know what position it is in without looking at it while the block is in effect.  3. For blocks in the legs and feet, returning to weight bearing and walking needs to be done carefully. You will need to wait until the numbness is entirely gone and the strength has returned. You should be able to move  your leg and foot normally before you try and bear weight or walk. You will need someone to be with you when you first try to ensure you do not fall and possibly risk injury.  4. Bruising and tenderness at the needle site are common side effects and will resolve in a few days.  5. Persistent numbness or new problems with movement should be communicated to the surgeon or the [facility] [phone] [facility] [phone]).

## 2011-06-16 NOTE — Anesthesia Postprocedure Evaluation (Signed)
  Anesthesia Post-op Note  Patient: April Stephenson  Procedure(s) Performed: Procedure(s) (LRB): SHOULDER ARTHROSCOPY WITH SUBACROMIAL DECOMPRESSION ()  Patient Location: PACU  Anesthesia Type: GA combined with regional for post-op pain  Level of Consciousness: awake  Airway and Oxygen Therapy: Patient Spontanous Breathing  Post-op Pain: none  Post-op Assessment: Post-op Vital signs reviewed and Patient's Cardiovascular Status Stable  Post-op Vital Signs: stable  Complications: No apparent anesthesia complications

## 2011-06-16 NOTE — Op Note (Signed)
06/16/2011  9:02 AM  PATIENT:  April Stephenson    PRE-OPERATIVE DIAGNOSIS:  left shoulder impingement, question adhesive capsulitis, question rotator cuff tear  POST-OPERATIVE DIAGNOSIS:  Left shoulder impingement syndrome with substantial bursitis, as well as early adhesive capsulitis, no evidence for rotator cuff tear, subscapularis tendinopathy  PROCEDURE:  SHOULDER ARTHROSCOPY WITH SUBACROMIAL DECOMPRESSION, complete bursectomy, acromioplasty, and debridement of the labrum and subscapularis.  SURGEON:  Eulas Post, MD  PHYSICIAN ASSISTANT: Janace Litten, OPA-C, present and scrubbed throughout the case, critical for completion in a timely fashion, and for retraction, instrumentation, and closure.  ANESTHESIA:   General  PREOPERATIVE INDICATIONS:  April Stephenson is a  51 y.o. female with a diagnosis of left shoulder impingement  who failed conservative measures and elected for surgical management.    The risks benefits and alternatives were discussed with the patient preoperatively including but not limited to the risks of infection, bleeding, nerve injury, cardiopulmonary complications, the need for revision surgery, among others, and the patient was willing to proceed.  OPERATIVE IMPLANTS: None  OPERATIVE FINDINGS: The shoulder was lacking about 20 of motion during examination under anesthesia. This did have an element and a feel of adhesive capsulitis. Additionally, the capsule itself was very injected and erythematous. I didn't think that there was a element of adhesive capsulitis. I also examined the right shoulder, which had substantial lack of motion as well, although manipulation of the right shoulder actually yielded lysis of adhesions. The left side did not. The diagnostic arthroscopy demonstrated intact biceps tendon was intact superior labrum. The anterior labrum was frayed, and the subscapularis had tendinopathy in the upper border, but was well attached to the humeral  head. The rotator cuff was completely intact from both the articular as well as the bursal side. There was some tendinopathy noted, however no full-thickness tear. There was moderate subacromial spurring, and the degree of hypertrophy of the bursa was remarkable. This was very injected, and very thickened, in fact I was concerned in the beginning that I was looking at muscle, but in fact it truly was the bursa. The glenohumeral articular cartilage was normal, and the posterior and inferior labrum was normal.  OPERATIVE PROCEDURE: The patient is brought to the operating room and placed in supine position. Examination under anesthesia was performed. I did examine the right shoulder as well, and during examination yielded lysis of adhesions on the right side, however the left side had a spongy feel to it but did not completely release.  After time out was performed diagnostic arthroscopy was carried out after a sterile prep and drape. Antibiotics were also given. The above-named findings were noted. I used the arthroscopic shaver to debride the labrum, as well as the ArthroCare to release the coracohumeral ligament in the rotator interval. I debrided the upper border of the subscapularis with the shaver back to a stable configuration.  I then placed the arthroscopic instruments in the subacromial space, and performed a complete bursectomy as well as a CA ligament release and a light acromioplasty. The a.c. joint appeared pristine, and was not violated. I inspected the rotator cuff from multiple portals, and was satisfied that there were no significant tears. I irrigated the shoulder copiously, and drained shoulder, and repaired the portals with Monocryl. She received a preoperative block. She tolerated the procedure well and no complications. We will begin physical therapy relatively soon in order to optimize her function and motion.

## 2011-06-16 NOTE — Progress Notes (Signed)
Assisted Dr. Kasik with left, interscalene  block. Side rails up, monitors on throughout procedure. See vital signs in flow sheet. Tolerated Procedure well. 

## 2011-06-16 NOTE — H&P (Signed)
PREOPERATIVE H&P  Chief Complaint: left shoulder impingement, question complete rupture of rotator cuff  HPI: April Stephenson is a 51 y.o. female who presents for preoperative history and physical with a diagnosis of left shoulder impingement, question complete rupture of rotator cuff. Symptoms are rated as moderate to severe, and have been worsening.  This is significantly impairing activities of daily living.  She has elected for surgical management.   Past Medical History  Diagnosis Date  . Migraine with aura   . Cancer     cervical cancer  . Depression     per Dr. Mila Homer with psych, h/o suicide attempt at 51 y/o  . Arthritis     with longstanding foot and back pain after injuries, followed by Dr. Lajoyce Corners   Past Surgical History  Procedure Date  . Tonsillectomy   . Foot surgery     R foot-great toe  . Abdominal hysterectomy 2000    TAH/BSO  . Colonoscopy    History   Social History  . Marital Status: Divorced    Spouse Name: N/A    Number of Children: N/A  . Years of Education: N/A   Social History Main Topics  . Smoking status: Never Smoker   . Smokeless tobacco: Not on file  . Alcohol Use: Yes  . Drug Use: No  . Sexually Active: Not on file   Other Topics Concern  . Not on file   Social History Narrative   3 kids, 1 with down's, 1 with asperger'sDivorcedWorks at [employer], former Print production planner    Family History  Problem Relation Age of Onset  . Cancer Mother   . Depression Mother   . Cancer Father   . Heart disease Father    Allergies  Allergen Reactions  . Voltaren Other (See Comments)    Nausea Per pt can tolerate toradol IM fine  . Venlafaxine     syncope   Prior to Admission medications   Medication Sig Start Date End Date Taking? Authorizing Provider  ARIPiprazole (ABILIFY) 2 MG tablet Take 2 mg by mouth daily.     Yes Historical Provider, MD  diazepam (VALIUM) 5 MG tablet Take 5 mg by mouth every 6 (six) hours as needed. For sleep or anxiety    Yes Historical Provider, MD  estrogens, conjugated, (PREMARIN) 0.625 MG tablet Take 0.625 mg by mouth daily.   Yes Historical Provider, MD  HYDROcodone-acetaminophen (VICODIN) 5-500 MG per tablet Take 1 tablet by mouth every 6 (six) hours as needed for pain. 03/28/11 03/27/12 Yes Gwenlyn Found Copland, MD  sertraline (ZOLOFT) 100 MG tablet Take 50 mg by mouth daily.    Yes Historical Provider, MD     Positive ROS: All other systems have been reviewed and were otherwise negative with the exception of those mentioned in the HPI and as above.  Physical Exam: General: Alert, no acute distress Cardiovascular: No pedal edema Respiratory: No cyanosis, no use of accessory musculature GI: No organomegaly, abdomen is soft and non-tender Skin: No lesions in the area of chief complaint Neurologic: Sensation intact distally Psychiatric: Patient is competent for consent with normal mood and affect Lymphatic: No axillary or cervical lymphadenopathy  MUSCULOSKELETAL: positive impingement signs, weak and stiff  Assessment: left shoulder impingement, question complete rupture of rotator cuff  Plan: Plan for Procedure(s): SHOULDER ARTHROSCOPY WITH ROTATOR CUFF REPAIR AND SUBACROMIAL DECOMPRESSION  The risks benefits and alternatives were discussed with the patient including but not limited to the risks of nonoperative treatment, versus  surgical intervention including infection, bleeding, nerve injury,  blood clots, cardiopulmonary complications, morbidity, mortality, among others, and they were willing to proceed.   Eulas Post, MD 06/16/2011 7:33 AM

## 2011-06-16 NOTE — Anesthesia Procedure Notes (Addendum)
Anesthesia Regional Block:  Interscalene brachial plexus block  Pre-Anesthetic Checklist: ,, timeout performed, Correct Patient, Correct Site, Correct Laterality, Correct Procedure, Correct Position, site marked, Risks and benefits discussed,  Surgical consent,  Pre-op evaluation,  At surgeon's request and post-op pain management  Laterality: Left  Prep: chloraprep       Needles:  Injection technique: Single-shot  Needle Type: Stimulator Needle - 40      Needle Gauge: 22 and 22 G    Additional Needles:  Procedures: nerve stimulator Interscalene brachial plexus block  Nerve Stimulator or Paresthesia:  Response: 0.5 mA,   Additional Responses:   Narrative:  Start time: 06/16/2011 7:08 AM End time: 06/16/2011 7:20 AM Injection made incrementally with aspirations every 5 mL. Anesthesiologist: Dr Gypsy Balsam  Additional Notes: 1610-9604 L ISB POP CHG prep, sterile tech #[id number] stim needle w/stim down to .5ma Multiple neg asp Vernia Buff .5% w/ epi 1:200000 total 25cc No compl Dr Gypsy Balsam   Procedure Name: Intubation Date/Time: 06/16/2011 7:45 AM Performed by: Gar Gibbon Pre-anesthesia Checklist: Patient identified, Emergency Drugs available, Suction available and Patient being monitored Patient Re-evaluated:Patient Re-evaluated prior to inductionOxygen Delivery Method: Circle System Utilized Preoxygenation: Pre-oxygenation with 100% oxygen Intubation Type: IV induction Ventilation: Mask ventilation without difficulty Laryngoscope Size: Mac and 3 Grade View: Grade II Tube type: Oral Tube size: 7.0 mm Number of attempts: 1 Airway Equipment and Method: stylet and oral airway Placement Confirmation: ETT inserted through vocal cords under direct vision,  positive ETCO2 and breath sounds checked- equal and bilateral Secured at: 21 cm Tube secured with: Tape Dental Injury: Teeth and Oropharynx as per pre-operative assessment

## 2011-06-16 NOTE — Transfer of Care (Signed)
Immediate Anesthesia Transfer of Care Note  Patient: April Stephenson  Procedure(s) Performed: Procedure(s) (LRB): SHOULDER ARTHROSCOPY WITH SUBACROMIAL DECOMPRESSION ()  Patient Location: PACU  Anesthesia Type: GA combined with regional for post-op pain  Level of Consciousness: awake and alert   Airway & Oxygen Therapy: Patient Spontanous Breathing and Patient connected to face mask oxygen  Post-op Assessment: Report given to PACU RN and Post -op Vital signs reviewed and stable  Post vital signs: Reviewed and stable  Complications: No apparent anesthesia complications

## 2011-06-16 NOTE — Anesthesia Preprocedure Evaluation (Addendum)
Anesthesia Evaluation  Patient identified by MRN, date of birth, ID band Patient awake    Reviewed: Allergy & Precautions, H&P , NPO status , Patient's Chart, lab work & pertinent test results  Airway Mallampati: I TM Distance: >3 FB Neck ROM: Full    Dental   Pulmonary    Pulmonary exam normal       Cardiovascular     Neuro/Psych  Headaches, Anxiety Depression    GI/Hepatic   Endo/Other    Renal/GU      Musculoskeletal   Abdominal   Peds  Hematology   Anesthesia Other Findings   Reproductive/Obstetrics                           Anesthesia Physical Anesthesia Plan  ASA: II  Anesthesia Plan: General   Post-op Pain Management:    Induction: Intravenous  Airway Management Planned: Oral ETT  Additional Equipment:   Intra-op Plan:   Post-operative Plan: Extubation in OR  Informed Consent: I have reviewed the patients History and Physical, chart, labs and discussed the procedure including the risks, benefits and alternatives for the proposed anesthesia with the patient or authorized representative who has indicated his/her understanding and acceptance.     Plan Discussed with: CRNA and Surgeon  Anesthesia Plan Comments:         Anesthesia Quick Evaluation

## 2011-06-19 LAB — POCT HEMOGLOBIN-HEMACUE: Hemoglobin: 14.4 g/dL (ref 12.0–15.0)

## 2011-09-18 ENCOUNTER — Ambulatory Visit (INDEPENDENT_AMBULATORY_CARE_PROVIDER_SITE_OTHER): Payer: BC Managed Care – PPO | Admitting: Family Medicine

## 2011-09-18 ENCOUNTER — Encounter: Payer: Self-pay | Admitting: Family Medicine

## 2011-09-18 VITALS — BP 122/84 | HR 84 | Temp 97.9°F | Wt 161.0 lb

## 2011-09-18 DIAGNOSIS — B360 Pityriasis versicolor: Secondary | ICD-10-CM

## 2011-09-18 MED ORDER — KETOCONAZOLE 2 % EX SHAM: MEDICATED_SHAMPOO | CUTANEOUS | Status: AC

## 2011-09-18 NOTE — Progress Notes (Signed)
Skin changes, noted h/o tinea versicolor.  Small areas with pigment change after sun exposure.  Arms, legs and trunk.  Not itchy.   Migraines.  Takes imitrex with some relief. Asking what else can be done.  Has about 3-4+ migraines a month.  Is currently taking prn robaxin after shoulder surgery.   Meds, vitals, and allergies reviewed.   ROS: See HPI.  Otherwise, noncontributory.  nad ncat Skin with irregular small areas of nondermatomal hypopigmentation.

## 2011-09-18 NOTE — Patient Instructions (Addendum)
Use the nizoral twice a week if needed.  Take the methocarbamol and the imitrex at the onset of the next few migraines and see if that helps.

## 2011-09-19 DIAGNOSIS — B360 Pityriasis versicolor: Secondary | ICD-10-CM | POA: Insufficient documentation

## 2011-09-19 NOTE — Assessment & Plan Note (Signed)
Use robaxin with imitrex early in HA.  If not improved, we may need to switch to flexeril.  Would try to avoid opiates.  If more frequent, she'll likely need prophylactic meds. She agrees.

## 2011-09-19 NOTE — Assessment & Plan Note (Signed)
Start nizoral and f/u prn. She agrees.

## 2011-10-03 ENCOUNTER — Ambulatory Visit (INDEPENDENT_AMBULATORY_CARE_PROVIDER_SITE_OTHER): Admitting: Family Medicine

## 2011-10-03 ENCOUNTER — Encounter: Payer: Self-pay | Admitting: Family Medicine

## 2011-10-03 VITALS — BP 108/62 | HR 93 | Temp 98.2°F | Wt 160.0 lb

## 2011-10-03 DIAGNOSIS — IMO0002 Reserved for concepts with insufficient information to code with codable children: Secondary | ICD-10-CM

## 2011-10-03 DIAGNOSIS — G43909 Migraine, unspecified, not intractable, without status migrainosus: Secondary | ICD-10-CM

## 2011-10-03 DIAGNOSIS — S76119A Strain of unspecified quadriceps muscle, fascia and tendon, initial encounter: Secondary | ICD-10-CM

## 2011-10-03 MED ORDER — KETOROLAC TROMETHAMINE 60 MG/2ML IM SOLN
60.0000 mg | Freq: Once | INTRAMUSCULAR | Status: DC
  Administered 2011-10-03: 60 mg via INTRAMUSCULAR

## 2011-10-03 NOTE — Progress Notes (Signed)
Migraine.  Started 3 days ago.  Nauseated, no help with promethazine.  Hasn't vomited but has had dry heaves.  Syncope with start of the episode.  Took imitrex w/o relief.  Phono and photophobia.  "this is a bad one, worse than normal." 5-6 'bad' migraines a month, ~4 less severe migraines per month.    [facility], has seen psych MD at [facility]    She was squatting to replace a water filter yesterday and had some R distal quad pain afterward.  No pop or snap.   Meds, vitals, and allergies reviewed.   ROS: See HPI.  Otherwise, noncontributory.  nad in darkened room, wearing sunglasses o/w.  PERRL Mmm Neck supple Speech wnl rrr ctab abd soft, not ttp Ext w/o edema Distal R quad ttp but the joint line isn't ttp and the skin is w/o bruising CN 2-12 wnl B, S/S/DTR wnl x4

## 2011-10-03 NOTE — Patient Instructions (Addendum)
Take the promethazine and your muscle relaxer tonight.   See Shirlee Limerick about your referral before you leave today. Take care.

## 2011-10-04 ENCOUNTER — Ambulatory Visit (INDEPENDENT_AMBULATORY_CARE_PROVIDER_SITE_OTHER): Payer: BC Managed Care – PPO | Admitting: Family Medicine

## 2011-10-04 ENCOUNTER — Encounter: Payer: Self-pay | Admitting: Family Medicine

## 2011-10-04 ENCOUNTER — Telehealth: Payer: Self-pay | Admitting: Family Medicine

## 2011-10-04 VITALS — BP 112/68 | HR 72 | Temp 98.3°F | Wt 158.5 lb

## 2011-10-04 DIAGNOSIS — S76119A Strain of unspecified quadriceps muscle, fascia and tendon, initial encounter: Secondary | ICD-10-CM | POA: Insufficient documentation

## 2011-10-04 DIAGNOSIS — G43909 Migraine, unspecified, not intractable, without status migrainosus: Secondary | ICD-10-CM

## 2011-10-04 MED ORDER — HYDROCODONE-ACETAMINOPHEN 5-325 MG PO TABS: 1.0000 | ORAL_TABLET | Freq: Four times a day (QID) | ORAL | Status: AC | PRN

## 2011-10-04 NOTE — Patient Instructions (Addendum)
We'll call in the vicodin.  Use that for now and keep the appointment with neurology. Take care.   [facility] [place], D. Katrinka Blazing, CMA

## 2011-10-04 NOTE — Telephone Encounter (Signed)
Caller: Ally/Patient; Patient Name: April Stephenson; PCP: Crawford Givens Clelia Croft); Best Callback Phone Number: [phone].  Called re continued migraine headache with pain in head and eyes still sensitive to light.  Onset 10/01/11.  Afebrile.  Seen in office 10/03/11 for migraine with syncope and vomiting.  Treated with Toradol IM; just woke up approx 1100 and headache still present. Pain rated 6/10. Has not used Imitrex; took Valium at 1000 without improvement.  Has appointment for headache clinic 11/03/11 at 1100. LMP/hysterectomy.  Advised to see MD within 4 hours for new onset of severe headache unrelieved with 4 hours of home care per Headache Guideline.  Appointment scheduled for 10/04/11 at 1545 with Dr Para March.

## 2011-10-04 NOTE — Assessment & Plan Note (Signed)
This should resolve.  No motor deficit.

## 2011-10-04 NOTE — Assessment & Plan Note (Signed)
toradol IM given.  Use promethazine and robaxin in meantime.  Refer to neuro for specialty help.  She agrees.  D/w pt.

## 2011-10-05 NOTE — Assessment & Plan Note (Signed)
Not resolved with robaxin, toradol, OTC meds, tramadol and imitrex.  At this point, use vicodin and out of work in meantime. D/w pt that opiates aren't a good option for control and will need HA clinic input for preventive meds. She agrees.

## 2011-10-05 NOTE — Progress Notes (Signed)
Mild dec in pain with toradol shot yesterday.  Was able to sleep last night but awoke with continued pain.  Photo and phonophobia.  No more episodes of syncope.  No focal neuro changes. Throbbing HA continues.  Has HA clinic referral pending.   Meds, vitals, and allergies reviewed.   ROS: See HPI.  Otherwise, noncontributory.  nad in darkened room, wearing sunglasses o/w.  PERRL  Mmm  Neck supple  Speech wnl  rrr  ctab  abd soft, not ttp  Ext w/o edema  CN 2-12 wnl B, S/S/DTR wnl x4

## 2011-10-12 ENCOUNTER — Ambulatory Visit (INDEPENDENT_AMBULATORY_CARE_PROVIDER_SITE_OTHER): Payer: BC Managed Care – PPO | Admitting: Physician Assistant

## 2011-10-12 VITALS — BP 130/82 | HR 62 | Temp 98.4°F | Resp 18 | Ht 68.0 in | Wt 158.0 lb

## 2011-10-12 DIAGNOSIS — R110 Nausea: Secondary | ICD-10-CM

## 2011-10-12 DIAGNOSIS — G43909 Migraine, unspecified, not intractable, without status migrainosus: Secondary | ICD-10-CM

## 2011-10-12 MED ORDER — KETOROLAC TROMETHAMINE 60 MG/2ML IM SOLN
60.0000 mg | Freq: Once | INTRAMUSCULAR | Status: AC
  Administered 2011-10-12: 60 mg via INTRAMUSCULAR

## 2011-10-12 MED ORDER — PROMETHAZINE HCL 25 MG/ML IJ SOLN
50.0000 mg | Freq: Once | INTRAMUSCULAR | Status: AC
  Administered 2011-10-12: 50 mg via INTRAMUSCULAR

## 2011-10-12 NOTE — Progress Notes (Signed)
  Subjective:    Patient ID: April Stephenson, female    DOB: 28-Apr-1960, 51 y.o.   MRN: [id number]  HPI 51 year old female presents with migraine headache since yesterday. Has a history of migraines for which she takes Imitrex and also has had a prescription for Vicodin from her PCP. She took 2 last night and got no relief. Did fall asleep and slept through the night but then woke up this morning and still had a migraine. Says this feels like a typical migraine for her, although due to her history her PCP at [facility] has referred her to see a Neurologist in 3 weeks.  Complains of photophobia, aura, and nausea without vomiting. Denies chest pain, SOB, syncope, or vision changes.     Review of Systems  All other systems reviewed and are negative.       Objective:   Physical Exam  Constitutional: She is oriented to person, place, and time. She appears well-developed and well-nourished.  HENT:  Head: Normocephalic and atraumatic.  Right Ear: Hearing, tympanic membrane, external ear and ear canal normal.  Left Ear: Hearing, tympanic membrane, external ear and ear canal normal.  Mouth/Throat: Uvula is midline, oropharynx is clear and moist and mucous membranes are normal. No oropharyngeal exudate.  Eyes: Conjunctivae are normal.  Neck: Normal range of motion.  Cardiovascular: Normal rate, regular rhythm and normal heart sounds.   Pulmonary/Chest: Effort normal and breath sounds normal.  Lymphadenopathy:    She has no cervical adenopathy.  Neurological: She is alert and oriented to person, place, and time.  Psychiatric: She has a normal mood and affect. Her behavior is normal. Judgment and thought content normal.          Assessment & Plan:   1. Migraine  ketorolac (TORADOL) injection 60 mg  2. Nausea alone  promethazine (PHENERGAN) injection 50 mg   Toradol/Phenergan injection given today.  May take Vicodin from PCP prn May follow up here or with PCP if symptoms worsen or fail to  improve.

## 2011-10-16 ENCOUNTER — Telehealth: Payer: Self-pay | Admitting: *Deleted

## 2011-10-16 NOTE — Telephone Encounter (Signed)
Patient called stating that she was given a prescription for Vicodin. Patient states that the bottle is now missing and her daughter has had friends in and out of the house. Patient wants to know if you will call her another script in?

## 2011-10-16 NOTE — Telephone Encounter (Signed)
Patient advised.

## 2011-10-16 NOTE — Telephone Encounter (Signed)
We can't fill this early if lost or stolen.  It's not up to me about this- that is the clinic policy.

## 2011-11-23 ENCOUNTER — Encounter: Payer: Self-pay | Admitting: Family Medicine

## 2012-02-26 ENCOUNTER — Ambulatory Visit (INDEPENDENT_AMBULATORY_CARE_PROVIDER_SITE_OTHER): Payer: BC Managed Care – PPO | Admitting: Family Medicine

## 2012-02-26 ENCOUNTER — Encounter: Payer: Self-pay | Admitting: Family Medicine

## 2012-02-26 VITALS — BP 130/86 | HR 97 | Temp 98.3°F | Wt 171.8 lb

## 2012-02-26 DIAGNOSIS — M549 Dorsalgia, unspecified: Secondary | ICD-10-CM

## 2012-02-26 MED ORDER — METHOCARBAMOL 500 MG PO TABS: 1000.0000 mg | ORAL_TABLET | Freq: Four times a day (QID) | ORAL | Status: DC

## 2012-02-26 NOTE — Patient Instructions (Addendum)
Take the robaxin, use the heating pad and see Shirlee [facility] about your referral before you leave today. Take care.

## 2012-02-26 NOTE — Progress Notes (Signed)
Back pain- had seen Dr. Lajoyce Corners through workman's comp- after a fall in 2008.  She has used leftover hydrocodone, flexeril and robaxin for the pain and muscle spasms in her back.  Robaxin helps the pain some.    She has talked with Dr. Lajoyce Corners about it; he advised to use gabapentin (no effect per patient).  Tramadol doesn't help her pain. She has taken naproxen, ibuprofen w/o relief.   Spasms are in B lower back, paraspinal but not midline.  She has radicular R leg pain and occ R leg jerking.  She has pain getting out of bed.  After she is able to stretch in the AM, she can walk some better but still very slowly.    She is asking for a second opinion.   Meds, vitals, and allergies reviewed.   ROS: See HPI.  Otherwise, noncontributory.  nad ncat rrr ctab Back w/o midline pain B paraspinal muscle tenderness. No rash Pain with flex/ext at the waist.  S/S/DTR wnl for BLE Gait is slow with a limp

## 2012-02-27 NOTE — Assessment & Plan Note (Signed)
She asked for 2nd opinion.  I am happy for her to see Dr. Lajoyce Corners or 2nd opinion.  We tried to arrange 2nd opinion but my understanding (after discussed with [facility], referral coordinator) is that the patient would need to have this address via Dr. Audrie Lia office.  I will defer to Dr. Lajoyce Corners.  Would use robaxin in meantime.  Her sx aren't emergent.

## 2012-04-26 ENCOUNTER — Ambulatory Visit: Payer: Self-pay | Admitting: Family Medicine

## 2012-05-15 ENCOUNTER — Ambulatory Visit (INDEPENDENT_AMBULATORY_CARE_PROVIDER_SITE_OTHER): Payer: BC Managed Care – PPO | Admitting: Family Medicine

## 2012-05-15 ENCOUNTER — Encounter: Payer: Self-pay | Admitting: Family Medicine

## 2012-05-15 VITALS — BP 128/80 | HR 88 | Temp 98.2°F | Wt 176.0 lb

## 2012-05-15 DIAGNOSIS — J329 Chronic sinusitis, unspecified: Secondary | ICD-10-CM

## 2012-05-15 MED ORDER — AMOXICILLIN-POT CLAVULANATE 875-125 MG PO TABS: 1.0000 | ORAL_TABLET | Freq: Two times a day (BID) | ORAL | Status: DC

## 2012-05-15 MED ORDER — HYDROCODONE-HOMATROPINE 5-1.5 MG/5ML PO SYRP: 5.0000 mL | ORAL_SOLUTION | Freq: Three times a day (TID) | ORAL | Status: DC | PRN

## 2012-05-15 NOTE — Patient Instructions (Addendum)
Keep using nasal saline and start the augmentin today. This should gradually get better.

## 2012-05-15 NOTE — Assessment & Plan Note (Signed)
Augmentin, supportive tx o/w. F/u prn.  Nontoxic.  D/w pt.

## 2012-05-15 NOTE — Progress Notes (Signed)
She had f/u with a second opinion about her back.  I will defer to ortho.  Cold sx started about 3 weeks ago.  First noted ST and swollen lymph nodes in her neck.  HA noted and voice changes prev noted.  Post nasal gtt note by patient.  No fevers known.  Ear pain prev.  Still with sig post nasal gtt and some ear pain.  Still with facial pain.  More HA recently.  Voice has returned.  ST is mild now.  Meds, vitals, and allergies reviewed.   ROS: See HPI.  Otherwise, noncontributory.  GEN: nad, alert and oriented HEENT: mucous membranes moist, tm w/o erythema, nasal exam w/o erythema, clear discharge noted,  OP with cobblestoning, sinuses ttp x4 NECK: supple w/o LA CV: rrr.   PULM: ctab, no inc wob EXT: no edema SKIN: no acute rash

## 2012-06-06 ENCOUNTER — Encounter: Payer: Self-pay | Admitting: Family Medicine

## 2012-06-06 ENCOUNTER — Encounter: Payer: Self-pay | Admitting: *Deleted

## 2012-06-06 ENCOUNTER — Ambulatory Visit (INDEPENDENT_AMBULATORY_CARE_PROVIDER_SITE_OTHER): Payer: BC Managed Care – PPO | Admitting: Family Medicine

## 2012-06-06 VITALS — BP 118/80 | HR 81 | Temp 98.2°F | Wt 176.0 lb

## 2012-06-06 DIAGNOSIS — R197 Diarrhea, unspecified: Secondary | ICD-10-CM

## 2012-06-06 MED ORDER — DIPHENOXYLATE-ATROPINE 2.5-0.025 MG PO TABS: 1.0000 | ORAL_TABLET | Freq: Four times a day (QID) | ORAL | Status: DC | PRN

## 2012-06-06 MED ORDER — TRAMADOL HCL 50 MG PO TABS: 50.0000 mg | ORAL_TABLET | Freq: Four times a day (QID) | ORAL | Status: DC | PRN

## 2012-06-06 NOTE — Patient Instructions (Signed)
Fiber: supplements, metamucil, benefiber, citrucel. Fiber cereal is also ok  Immodium up to 8 times a day

## 2012-06-06 NOTE — Progress Notes (Signed)
Nature conservation officer at [facility] [address] [place] [address] Phone: [phone] Fax: [phone]  Date:  06/06/2012   Name:  April Stephenson   DOB:  1960/05/24   MRN:  [id number] Gender: female Age: 52 y.o.  Primary Physician:  Crawford Givens, MD  Evaluating MD: Hannah Beat, MD   Chief Complaint: Diarrhea   History of Present Illness:  April Stephenson is a 39 y.o. pleasant patient who presents with the following:  Has been having diarrhea for 6 months and she works nights. At least a few months. In the past week, will go through and yellow or green. No blood or melena.   Mother had colon cancer: in her 67's Uncle in 32's but had colon cancer Had a colonoscopy a couple of years ago - at the [facility], and normal. F/u 5 years.   No travel, no untreated water. Used some Immodium at beginning only.    Patient Active Problem List  Diagnosis  . Depression  . Back pain  . Migraine without aura  . TMJ click  . Foot pain, right  . Cervical cancer  . Insomnia  . Frozen shoulder  . Sinusitis  . Impingement syndrome of left shoulder  . Tinea versicolor  . Quadriceps strain    Past Medical History  Diagnosis Date  . Migraine with aura   . Cancer     cervical cancer  . Depression     per Dr. Mila Homer with psych, h/o suicide attempt at 52 y/o  . Arthritis     with longstanding foot and back pain after injuries, followed by Dr. Lajoyce Corners  . Impingement syndrome of left shoulder 06/16/2011    Past Surgical History  Procedure Laterality Date  . Tonsillectomy    . Foot surgery      R foot-great toe  . Abdominal hysterectomy  2000    TAH/BSO  . Colonoscopy    . Shoulder surgery      2013    History   Social History  . Marital Status: Divorced    Spouse Name: N/A    Number of Children: N/A  . Years of Education: N/A   Occupational History  . Not on file.   Social History Main Topics  . Smoking status: Never Smoker   . Smokeless tobacco:  Never Used  . Alcohol Use: Yes     Comment: occassionally  . Drug Use: No  . Sexually Active: Not on file   Other Topics Concern  . Not on file   Social History Narrative   3 kids, 1 with down's, 1 with asperger's   Divorced   Works at [employer], former Print production planner     Family History  Problem Relation Age of Onset  . Cancer Mother   . Depression Mother   . Cancer Father   . Heart disease Father     Allergies  Allergen Reactions  . Diclofenac Sodium Other (See Comments)    Nausea Per pt can tolerate toradol IM fine  . Venlafaxine     syncope  . Tramadol     dizzy    Medication list has been reviewed and updated.  Outpatient Prescriptions Prior to Visit  Medication Sig Dispense Refill  . ARIPiprazole (ABILIFY) 2 MG tablet Take 2 mg by mouth daily.        . diazepam (VALIUM) 5 MG tablet Take 5 mg by mouth every 6 (six) hours as needed. For sleep or anxiety      .  sertraline (ZOLOFT) 100 MG tablet Take 50 mg by mouth daily.       Marland Kitchen amoxicillin-clavulanate (AUGMENTIN) 875-125 MG per tablet Take 1 tablet by mouth 2 (two) times daily.  20 tablet  0  . HYDROcodone-homatropine (HYCODAN) 5-1.5 MG/5ML syrup Take 5 mLs by mouth every 8 (eight) hours as needed for cough.  60 mL  0   No facility-administered medications prior to visit.    Review of Systems:  As above. No fever, chills, or sweats.  Physical Examination: BP 118/80  Pulse 81  Temp(Src) 98.2 F (36.8 C) (Oral)  Wt 176 lb (79.833 kg)  BMI 26.77 kg/m2  SpO2 98%  Ideal Body Weight:    GEN: WDWN, NAD, Non-toxic, A & O x 3 HEENT: Atraumatic, Normocephalic. Neck supple. No masses, No LAD. Ears and Nose: No external deformity. CV: RRR, No M/G/R. No JVD. No thrill. No extra heart sounds. PULM: CTA B, no wheezes, crackles, rhonchi. No retractions. No resp. distress. No accessory muscle use. ABD: S, NT, ND, +BS. No rebound. No HSM. EXTR: No c/c/e NEURO Normal gait.  PSYCH: Normally interactive. Conversant.  Not depressed or anxious appearing.  Calm demeanor.    Assessment and Plan:  Diarrhea - Plan: Clostridium difficile EIA, Stool culture, Clostridium difficile EIA, Stool culture, CANCELED: Stool culture, CANCELED: Clostridium Difficile by PCR  Chronic diarrhea discussed in detail. She has had a colonoscopy, showed inflammatory bowel disease would seem to be quite unlikely. She does have a lot of anxiety and stress, and IBS may be the ultimate cause. I discussed basic IBS treatment such as fluids, fiber, and I suggested for the meantime that she can do some Imodium.she also is going to be traveling in the next week or so, so I gave her some Lomotil.  She also requested something for pain or when she is standing, so I gave her some tramadol.  Orders Today:  Orders Placed This Encounter  Procedures  . Clostridium difficile EIA    Standing Status: Future     Number of Occurrences: 1     Standing Expiration Date: 06/06/2013  . Stool culture    Standing Status: Future     Number of Occurrences: 1     Standing Expiration Date: 06/06/2013    Updated Medication List: (Includes new medications, updates to list, dose adjustments) Meds ordered this encounter  Medications  . traMADol (ULTRAM) 50 MG tablet    Sig: Take 1 tablet (50 mg total) by mouth every 6 (six) hours as needed for pain.    Dispense:  40 tablet    Refill:  2  . diphenoxylate-atropine (LOMOTIL) 2.5-0.025 MG per tablet    Sig: Take 1 tablet by mouth 4 (four) times daily as needed for diarrhea or loose stools.    Dispense:  30 tablet    Refill:  0    Medications Discontinued: Medications Discontinued During This Encounter  Medication Reason  . amoxicillin-clavulanate (AUGMENTIN) 875-125 MG per tablet Therapy completed  . HYDROcodone-homatropine (HYCODAN) 5-1.5 MG/5ML syrup Therapy completed      Signed, Karleen Hampshire T. Jheri Mitter, MD 06/06/2012 10:20 AM

## 2012-06-07 LAB — CLOSTRIDIUM DIFFICILE EIA: CDIFTX: NEGATIVE

## 2012-06-10 LAB — STOOL CULTURE

## 2012-06-11 ENCOUNTER — Encounter: Payer: Self-pay | Admitting: *Deleted

## 2012-06-11 ENCOUNTER — Telehealth: Payer: Self-pay

## 2012-06-11 NOTE — Telephone Encounter (Signed)
Pt left v/m is on vacation and request stool results left on cell v/m if no answer. Patient notified as instructed by result notes by cell v/m.

## 2012-06-28 ENCOUNTER — Emergency Department (HOSPITAL_COMMUNITY)
Admission: EM | Admit: 2012-06-28 | Discharge: 2012-06-29 | Disposition: A | Discharge status: Home / Self Care | Payer: Federal, State, Local not specified - PPO | Attending: Emergency Medicine | Admitting: Emergency Medicine

## 2012-06-28 ENCOUNTER — Encounter (HOSPITAL_COMMUNITY): Payer: Self-pay | Admitting: Emergency Medicine

## 2012-06-28 DIAGNOSIS — F3289 Other specified depressive episodes: Secondary | ICD-10-CM | POA: Insufficient documentation

## 2012-06-28 DIAGNOSIS — Z79899 Other long term (current) drug therapy: Secondary | ICD-10-CM | POA: Insufficient documentation

## 2012-06-28 DIAGNOSIS — F329 Major depressive disorder, single episode, unspecified: Secondary | ICD-10-CM | POA: Insufficient documentation

## 2012-06-28 DIAGNOSIS — G43909 Migraine, unspecified, not intractable, without status migrainosus: Secondary | ICD-10-CM | POA: Insufficient documentation

## 2012-06-28 DIAGNOSIS — H53149 Visual discomfort, unspecified: Secondary | ICD-10-CM | POA: Insufficient documentation

## 2012-06-28 DIAGNOSIS — Z8739 Personal history of other diseases of the musculoskeletal system and connective tissue: Secondary | ICD-10-CM | POA: Insufficient documentation

## 2012-06-28 DIAGNOSIS — Z8541 Personal history of malignant neoplasm of cervix uteri: Secondary | ICD-10-CM | POA: Insufficient documentation

## 2012-06-28 DIAGNOSIS — R51 Headache: Secondary | ICD-10-CM | POA: Insufficient documentation

## 2012-06-28 DIAGNOSIS — R112 Nausea with vomiting, unspecified: Secondary | ICD-10-CM | POA: Insufficient documentation

## 2012-06-28 DIAGNOSIS — F17200 Nicotine dependence, unspecified, uncomplicated: Secondary | ICD-10-CM | POA: Insufficient documentation

## 2012-06-28 MED ORDER — SODIUM CHLORIDE 0.9 % IV BOLUS (SEPSIS)
1000.0000 mL | Freq: Once | INTRAVENOUS | Status: AC
  Administered 2012-06-28: 1000 mL via INTRAVENOUS

## 2012-06-28 MED ORDER — METOCLOPRAMIDE HCL 5 MG/ML IJ SOLN
10.0000 mg | Freq: Once | INTRAMUSCULAR | Status: AC
  Administered 2012-06-28: 10 mg via INTRAVENOUS
  Filled 2012-06-28: qty 2

## 2012-06-28 MED ORDER — DIPHENHYDRAMINE HCL 50 MG/ML IJ SOLN
25.0000 mg | Freq: Once | INTRAMUSCULAR | Status: AC
  Administered 2012-06-28: 25 mg via INTRAVENOUS
  Filled 2012-06-28: qty 1

## 2012-06-28 MED ORDER — KETOROLAC TROMETHAMINE 30 MG/ML IJ SOLN
30.0000 mg | Freq: Once | INTRAMUSCULAR | Status: AC
  Administered 2012-06-28: 30 mg via INTRAVENOUS
  Filled 2012-06-28: qty 1

## 2012-06-28 MED ORDER — DEXAMETHASONE SODIUM PHOSPHATE 10 MG/ML IJ SOLN
10.0000 mg | Freq: Once | INTRAMUSCULAR | Status: AC
  Administered 2012-06-28: 10 mg via INTRAVENOUS
  Filled 2012-06-28: qty 1

## 2012-06-28 NOTE — ED Provider Notes (Signed)
History     CSN: [id number]  Arrival date & time 06/28/12  2237   First MD Initiated Contact with Patient 06/28/12 2253      Chief Complaint  Patient presents with  . Migraine    (Consider location/radiation/quality/duration/timing/severity/associated sxs/prior treatment) HPI Comments: 52 y/o female with a PMHx of migraines, depression, cervical CA and arthritis presents to the ED complaining of sudden onset migraine headache beginning 2 hours ago. Patient states she was sitting on the couch watching TV when the pain began, described as throbbing towards the front of her head bilateral rated 10/10. Tried taking imitrex and diazepam without relief. Normally has relief with imitrex, diazepam and vicodin, however had no more vicodin at home. Admits to associated nausea, vomiting, photophobia and spots in her vision. States she normally does not have vomiting with her typical migraines, just dry-heaving. Last migraine about 6 weeks ago. Denies fever, chills, recent illness, neck pain or stiffness, confusion, dizziness.   Patient is a 52 y.o. female presenting with migraines. The history is provided by the patient.  Migraine Associated symptoms include headaches, nausea and vomiting. Pertinent negatives include no chest pain, chills, fever, neck pain or numbness.    Past Medical History  Diagnosis Date  . Migraine with aura   . Cancer     cervical cancer  . Depression     per Dr. Mila Homer with psych, h/o suicide attempt at 52 y/o  . Arthritis     with longstanding foot and back pain after injuries, followed by Dr. Lajoyce Corners  . Impingement syndrome of left shoulder 06/16/2011    Past Surgical History  Procedure Laterality Date  . Tonsillectomy    . Foot surgery      R foot-great toe  . Abdominal hysterectomy  2000    TAH/BSO  . Colonoscopy    . Shoulder surgery      2013    Family History  Problem Relation Age of Onset  . Cancer Mother   . Depression Mother   . Cancer Father   .  Heart disease Father     History  Substance Use Topics  . Smoking status: Current Some Day Smoker  . Smokeless tobacco: Never Used  . Alcohol Use: Yes     Comment: occassionally    OB History   Grav Para Term Preterm Abortions TAB SAB Ect Mult Living                  Review of Systems  Constitutional: Negative for fever and chills.  HENT: Negative for neck pain and neck stiffness.   Eyes: Positive for photophobia and visual disturbance.  Respiratory: Negative for shortness of breath.   Cardiovascular: Negative for chest pain.  Gastrointestinal: Positive for nausea and vomiting.  Neurological: Positive for headaches. Negative for speech difficulty and numbness.  Psychiatric/Behavioral: Negative for confusion.  All other systems reviewed and are negative.    Allergies  Diclofenac sodium; Venlafaxine; and Tramadol  Home Medications   Current Outpatient Rx  Name  Route  Sig  Dispense  Refill  . ARIPiprazole (ABILIFY) 2 MG tablet   Oral   Take 2 mg by mouth daily.           . diazepam (VALIUM) 5 MG tablet   Oral   Take 5 mg by mouth every 6 (six) hours as needed. For sleep or anxiety         . diphenoxylate-atropine (LOMOTIL) 2.5-0.025 MG per tablet   Oral   Take  1 tablet by mouth 4 (four) times daily as needed for diarrhea or loose stools.   30 tablet   0   . sertraline (ZOLOFT) 100 MG tablet   Oral   Take 100 mg by mouth daily.          . SUMAtriptan (IMITREX) 25 MG tablet   Oral   Take 25 mg by mouth every 2 (two) hours as needed for migraine (take 25 mg at start of headache may repeat in 2 hours if needed for migraine).         . traMADol (ULTRAM) 50 MG tablet   Oral   Take 1 tablet (50 mg total) by mouth every 6 (six) hours as needed for pain.   40 tablet   2     BP 131/82  Pulse 74  Temp(Src) 98.3 F (36.8 C) (Oral)  Resp 20  Ht 5\' 7"  (1.702 m)  Wt 180 lb (81.647 kg)  BMI 28.19 kg/m2  SpO2 96%  Physical Exam  Nursing note and  vitals reviewed. Constitutional: She is oriented to person, place, and time. She appears well-developed and well-nourished. No distress.  HENT:  Head: Normocephalic and atraumatic.  Mouth/Throat: Oropharynx is clear and moist.  Eyes: Conjunctivae and EOM are normal. Pupils are equal, round, and reactive to light.  Neck: Normal range of motion. Neck supple.  Cardiovascular: Normal rate, regular rhythm, normal heart sounds and intact distal pulses.   Pulmonary/Chest: Effort normal and breath sounds normal. No respiratory distress.  Abdominal: Soft. Normal appearance and bowel sounds are normal. There is generalized tenderness (mild "soreness"). There is no rigidity, no rebound and no guarding.  Musculoskeletal: Normal range of motion. She exhibits no edema.  Neurological: She is alert and oriented to person, place, and time. She has normal strength. No cranial nerve deficit or sensory deficit. She displays a negative Romberg sign. Coordination and gait normal.  Skin: Skin is warm and dry. No rash noted.  Psychiatric: She has a normal mood and affect. Her behavior is normal.    ED Course  Procedures (including critical care time)  Labs Reviewed - No data to display No results found.   1. Migraine       MDM  52 y/o female with migraine. No red flags concerning patient's headache. Vitals stable, NAD. Afebrile. Neuro exam unremarkable. Will treat migraine with fluids, decadron, reglan, benadryl, toradol. 12:05 AM Patient reports "a lot" of improvement of her headache with above medication regimen. Nausea subsided. Will give PO challenge and re-evaluate. 12:26 AM Patient tolerated PO challenge, fluids/crackers. No nausea. Headache subsided. She is in NAD. Stable for discharge. She will f/u with her PCP next week. Close return precautions discussed. Patient states her understanding of plan and is agreeable.  Trevor Mace, PA-C 06/29/12 0028

## 2012-06-28 NOTE — ED Notes (Signed)
Pt c/o migraine x 2 hours, pain worse than usual. Imitrex and Diazepam PTA +n/v, +photophobia.

## 2012-06-29 NOTE — ED Notes (Signed)
Pt c/o headache times two hours home medication not effective. V/S stable prn meds given family at bedside will monitor.

## 2012-06-29 NOTE — ED Provider Notes (Signed)
Medical screening examination/treatment/procedure(s) were performed by non-physician practitioner and as supervising physician I was immediately available for consultation/collaboration.   Joya Gaskins, MD 06/29/12 [phone]

## 2012-06-29 NOTE — ED Notes (Signed)
Pt alert x4 rates pain a 2 out of 10. D/c home with family home instructions read teach back done. Will monitor.

## 2012-09-26 ENCOUNTER — Ambulatory Visit (INDEPENDENT_AMBULATORY_CARE_PROVIDER_SITE_OTHER): Payer: BC Managed Care – PPO | Admitting: Family Medicine

## 2012-09-26 VITALS — BP 120/68 | HR 85 | Temp 98.0°F | Resp 16 | Ht 66.5 in | Wt 176.4 lb

## 2012-09-26 DIAGNOSIS — G43901 Migraine, unspecified, not intractable, with status migrainosus: Secondary | ICD-10-CM

## 2012-09-26 DIAGNOSIS — J069 Acute upper respiratory infection, unspecified: Secondary | ICD-10-CM

## 2012-09-26 DIAGNOSIS — G43909 Migraine, unspecified, not intractable, without status migrainosus: Secondary | ICD-10-CM

## 2012-09-26 MED ORDER — KETOROLAC TROMETHAMINE 60 MG/2ML IM SOLN
60.0000 mg | Freq: Once | INTRAMUSCULAR | Status: AC
  Administered 2012-09-26: 60 mg via INTRAMUSCULAR

## 2012-09-26 MED ORDER — PROMETHAZINE HCL 25 MG/ML IJ SOLN
25.0000 mg | Freq: Once | INTRAMUSCULAR | Status: AC
  Administered 2012-09-26: 25 mg via INTRAMUSCULAR

## 2012-09-26 NOTE — Patient Instructions (Addendum)
Saline nasal spray atleast 4 times per day, afrin nasal spray or sudafed up to 2-3 days only for sinus pressure. You can try your Imitrex as soon as you get home. Ultram only if needed, Recheck if not improving as usual for your migraines.  Return to the clinic or go to the nearest emergency room if any of your symptoms worsen or new symptoms occur.   Migraine Headache A migraine headache is an intense, throbbing pain on one or both sides of your head. A migraine can last for 30 minutes to several hours. CAUSES  The exact cause of a migraine headache is not always known. However, a migraine may be caused when nerves in the brain become irritated and release chemicals that cause inflammation. This causes pain. SYMPTOMS  Pain on one or both sides of your head.  Pulsating or throbbing pain.  Severe pain that prevents daily activities.  Pain that is aggravated by any physical activity.  Nausea, vomiting, or both.  Dizziness.  Pain with exposure to bright lights, loud noises, or activity.  General sensitivity to bright lights, loud noises, or smells. Before you get a migraine, you may get warning signs that a migraine is coming (aura). An aura may include:  Seeing flashing lights.  Seeing bright spots, halos, or zig-zag lines.  Having tunnel vision or blurred vision.  Having feelings of numbness or tingling.  Having trouble talking.  Having muscle weakness. MIGRAINE TRIGGERS  Alcohol.  Smoking.  Stress.  Menstruation.  Aged cheeses.  Foods or drinks that contain nitrates, glutamate, aspartame, or tyramine.  Lack of sleep.  Chocolate.  Caffeine.  Hunger.  Physical exertion.  Fatigue.  Medicines used to treat chest pain (nitroglycerine), birth control pills, estrogen, and some blood pressure medicines. DIAGNOSIS  A migraine headache is often diagnosed based on:  Symptoms.  Physical examination.  A CT scan or MRI of your head. TREATMENT Medicines may be  given for pain and nausea. Medicines can also be given to help prevent recurrent migraines.  HOME CARE INSTRUCTIONS  Only take over-the-counter or prescription medicines for pain or discomfort as directed by your caregiver. The use of long-term narcotics is not recommended.  Lie down in a dark, quiet room when you have a migraine.  Keep a journal to find out what may trigger your migraine headaches. For example, write down:  What you eat and drink.  How much sleep you get.  Any change to your diet or medicines.  Limit alcohol consumption.  Quit smoking if you smoke.  Get 7 to 9 hours of sleep, or as recommended by your caregiver.  Limit stress.  Keep lights dim if bright lights bother you and make your migraines worse. SEEK IMMEDIATE MEDICAL CARE IF:   Your migraine becomes severe.  You have a fever.  You have a stiff neck.  You have vision loss.  You have muscular weakness or loss of muscle control.  You start losing your balance or have trouble walking.  You feel faint or pass out.  You have severe symptoms that are different from your first symptoms. MAKE SURE YOU:   Understand these instructions.  Will watch your condition.  Will get help right away if you are not doing well or get worse. Document Released: 02/06/2005 Document Revised: 05/01/2011 Document Reviewed: 01/27/2011 [facility] Patient Information 2014 [place], [place].    Upper Respiratory Infection, Adult An upper respiratory infection (URI) is also sometimes known as the common cold. The upper respiratory tract includes the  nose, sinuses, throat, trachea, and bronchi. Bronchi are the airways leading to the lungs. Most people improve within 1 week, but symptoms can last up to 2 weeks. A residual cough may last even longer.  CAUSES Many different viruses can infect the tissues lining the upper respiratory tract. The tissues become irritated and inflamed and often become very moist. Mucus production  is also common. A cold is contagious. You can easily spread the virus to others by oral contact. This includes kissing, sharing a glass, coughing, or sneezing. Touching your mouth or nose and then touching a surface, which is then touched by another person, can also spread the virus. SYMPTOMS  Symptoms typically develop 1 to 3 days after you come in contact with a cold virus. Symptoms vary from person to person. They may include:  Runny nose.  Sneezing.  Nasal congestion.  Sinus irritation.  Sore throat.  Loss of voice (laryngitis).  Cough.  Fatigue.  Muscle aches.  Loss of appetite.  Headache.  Low-grade fever. DIAGNOSIS  You might diagnose your own cold based on familiar symptoms, since most people get a cold 2 to 3 times a year. Your caregiver can confirm this based on your exam. Most importantly, your caregiver can check that your symptoms are not due to another disease such as strep throat, sinusitis, pneumonia, asthma, or epiglottitis. Blood tests, throat tests, and X-rays are not necessary to diagnose a common cold, but they may sometimes be helpful in excluding other more serious diseases. Your caregiver will decide if any further tests are required. RISKS AND COMPLICATIONS  You may be at risk for a more severe case of the common cold if you smoke cigarettes, have chronic heart disease (such as heart failure) or lung disease (such as asthma), or if you have a weakened immune system. The very young and very old are also at risk for more serious infections. Bacterial sinusitis, middle ear infections, and bacterial pneumonia can complicate the common cold. The common cold can worsen asthma and chronic obstructive pulmonary disease (COPD). Sometimes, these complications can require emergency medical care and may be life-threatening. PREVENTION  The best way to protect against getting a cold is to practice good hygiene. Avoid oral or hand contact with people with cold symptoms.  Wash your hands often if contact occurs. There is no clear evidence that vitamin C, vitamin E, echinacea, or exercise reduces the chance of developing a cold. However, it is always recommended to get plenty of rest and practice good nutrition. TREATMENT  Treatment is directed at relieving symptoms. There is no cure. Antibiotics are not effective, because the infection is caused by a virus, not by bacteria. Treatment may include:  Increased fluid intake. Sports drinks offer valuable electrolytes, sugars, and fluids.  Breathing heated mist or steam (vaporizer or shower).  Eating chicken soup or other clear broths, and maintaining good nutrition.  Getting plenty of rest.  Using gargles or lozenges for comfort.  Controlling fevers with ibuprofen or acetaminophen as directed by your caregiver.  Increasing usage of your inhaler if you have asthma. Zinc gel and zinc lozenges, taken in the first 24 hours of the common cold, can shorten the duration and lessen the severity of symptoms. Pain medicines may help with fever, muscle aches, and throat pain. A variety of non-prescription medicines are available to treat congestion and runny nose. Your caregiver can make recommendations and may suggest nasal or lung inhalers for other symptoms.  HOME CARE INSTRUCTIONS   Only take over-the-counter  or prescription medicines for pain, discomfort, or fever as directed by your caregiver.  Use a warm mist humidifier or inhale steam from a shower to increase air moisture. This may keep secretions moist and make it easier to breathe.  Drink enough water and fluids to keep your urine clear or pale yellow.  Rest as needed.  Return to work when your temperature has returned to normal or as your caregiver advises. You may need to stay home longer to avoid infecting others. You can also use a face mask and careful hand washing to prevent spread of the virus. SEEK MEDICAL CARE IF:   After the first few days, you  feel you are getting worse rather than better.  You need your caregiver's advice about medicines to control symptoms.  You develop chills, worsening shortness of breath, or brown or red sputum. These may be signs of pneumonia.  You develop yellow or brown nasal discharge or pain in the face, especially when you bend forward. These may be signs of sinusitis.  You develop a fever, swollen neck glands, pain with swallowing, or white areas in the back of your throat. These may be signs of strep throat. SEEK IMMEDIATE MEDICAL CARE IF:   You have a fever.  You develop severe or persistent headache, ear pain, sinus pain, or chest pain.  You develop wheezing, a prolonged cough, cough up blood, or have a change in your usual mucus (if you have chronic lung disease).  You develop sore muscles or a stiff neck. Document Released: 08/02/2000 Document Revised: 05/01/2011 Document Reviewed: 06/10/2010 [facility] Patient Information 2014 [place], [place].

## 2012-09-26 NOTE — Progress Notes (Signed)
Subjective:    Patient ID: April Stephenson, female    DOB: May 07, 1960, 52 y.o.   MRN: [id number]  HPI April Stephenson is a 52 y.o. female  Cold sx's staring few days ago - head and chest congestion, sneezing, pnd.  No known fever, but subjective felt hot. Feels like cold sx's improving, but HA started this afternoon bout 2 and a half hours ago.  Has tooth pain similar to similar migriaines. Feels similar to prior migraines - top of head - to bitemporal.  Has not taken anything or any attempted treatments. Has not taken imitrex - has this at home. Usually get relief with toradol and phenergan injection.  No aura this time. No  N/V. No neck stiffness, slightly sore. Slight photophobia, no phonophobia.   Tx: dayquil., nyquil, vitamin C.  No known sick contacts.    Review of Systems  Constitutional: Negative for fever and chills.  HENT: Positive for congestion. Negative for neck stiffness.   Respiratory: Positive for cough. Negative for shortness of breath.   Gastrointestinal: Negative for nausea and vomiting.  Musculoskeletal: Negative for gait problem.  Skin: Negative for rash.  Neurological: Positive for headaches. Negative for seizures, facial asymmetry, speech difficulty, weakness and light-headedness.   As above.     Objective:   Physical Exam  Vitals reviewed. Constitutional: She is oriented to person, place, and time. She appears well-developed and well-nourished. No distress.  HENT:  Head: Normocephalic and atraumatic.  Right Ear: Hearing, tympanic membrane, external ear and ear canal normal.  Left Ear: Hearing, tympanic membrane, external ear and ear canal normal.  Nose: Mucosal edema (min) present. Right sinus exhibits maxillary sinus tenderness and frontal sinus tenderness. Left sinus exhibits maxillary sinus tenderness and frontal sinus tenderness.  Mouth/Throat: Oropharynx is clear and moist. No oropharyngeal exudate.  Eyes: Conjunctivae and EOM are normal. Pupils are  equal, round, and reactive to light.  Cardiovascular: Normal rate, regular rhythm, normal heart sounds and intact distal pulses.   No murmur heard. Pulmonary/Chest: Effort normal and breath sounds normal. No respiratory distress. She has no wheezes. She has no rhonchi.  Neurological: She is alert and oriented to person, place, and time. No cranial nerve deficit or sensory deficit. She exhibits normal muscle tone. Coordination and gait normal. GCS eye subscore is 4. GCS verbal subscore is 5. GCS motor subscore is 6.  No pronator drift, nonfocal exam.   Skin: Skin is warm and dry. No rash noted.  Psychiatric: She has a normal mood and affect. Her behavior is normal.          Assessment & Plan:  April Stephenson is a 52 y.o. female Migraine with status migrainosus - Plan: promethazine (PHENERGAN) injection 25 mg, ketorolac (TORADOL) injection 60 mg  Migraine  Acute upper respiratory infections of unspecified site  URI, with HA - likely combination of sinus pressure, and migraine. Reassuring, nonfocal neuro exam.  toradol and phenergan given as above. Sx care for improving URI discussed including trial of short term afrin or sudafed for pressure, can try Imitrex x 1 at home, and has ultram if needed. Rtc/er precautions discussed.   Meds ordered this encounter  Medications  . promethazine (PHENERGAN) injection 25 mg    Sig:   . ketorolac (TORADOL) injection 60 mg    Sig:    Patient Instructions  Saline nasal spray atleast 4 times per day, afrin nasal spray or sudafed up to 2-3 days only for sinus pressure. You can try your Imitrex  as soon as you get home. Ultram only if needed, Recheck if not improving as usual for your migraines.  Return to the clinic or go to the nearest emergency room if any of your symptoms worsen or new symptoms occur.   Migraine Headache A migraine headache is an intense, throbbing pain on one or both sides of your head. A migraine can last for 30 minutes to  several hours. CAUSES  The exact cause of a migraine headache is not always known. However, a migraine may be caused when nerves in the brain become irritated and release chemicals that cause inflammation. This causes pain. SYMPTOMS  Pain on one or both sides of your head.  Pulsating or throbbing pain.  Severe pain that prevents daily activities.  Pain that is aggravated by any physical activity.  Nausea, vomiting, or both.  Dizziness.  Pain with exposure to bright lights, loud noises, or activity.  General sensitivity to bright lights, loud noises, or smells. Before you get a migraine, you may get warning signs that a migraine is coming (aura). An aura may include:  Seeing flashing lights.  Seeing bright spots, halos, or zig-zag lines.  Having tunnel vision or blurred vision.  Having feelings of numbness or tingling.  Having trouble talking.  Having muscle weakness. MIGRAINE TRIGGERS  Alcohol.  Smoking.  Stress.  Menstruation.  Aged cheeses.  Foods or drinks that contain nitrates, glutamate, aspartame, or tyramine.  Lack of sleep.  Chocolate.  Caffeine.  Hunger.  Physical exertion.  Fatigue.  Medicines used to treat chest pain (nitroglycerine), birth control pills, estrogen, and some blood pressure medicines. DIAGNOSIS  A migraine headache is often diagnosed based on:  Symptoms.  Physical examination.  A CT scan or MRI of your head. TREATMENT Medicines may be given for pain and nausea. Medicines can also be given to help prevent recurrent migraines.  HOME CARE INSTRUCTIONS  Only take over-the-counter or prescription medicines for pain or discomfort as directed by your caregiver. The use of long-term narcotics is not recommended.  Lie down in a dark, quiet room when you have a migraine.  Keep a journal to find out what may trigger your migraine headaches. For example, write down:  What you eat and drink.  How much sleep you get.  Any  change to your diet or medicines.  Limit alcohol consumption.  Quit smoking if you smoke.  Get 7 to 9 hours of sleep, or as recommended by your caregiver.  Limit stress.  Keep lights dim if bright lights bother you and make your migraines worse. SEEK IMMEDIATE MEDICAL CARE IF:   Your migraine becomes severe.  You have a fever.  You have a stiff neck.  You have vision loss.  You have muscular weakness or loss of muscle control.  You start losing your balance or have trouble walking.  You feel faint or pass out.  You have severe symptoms that are different from your first symptoms. MAKE SURE YOU:   Understand these instructions.  Will watch your condition.  Will get help right away if you are not doing well or get worse. Document Released: 02/06/2005 Document Revised: 05/01/2011 Document Reviewed: 01/27/2011 [facility] Patient Information 2014 [place], [place].    Upper Respiratory Infection, Adult An upper respiratory infection (URI) is also sometimes known as the common cold. The upper respiratory tract includes the nose, sinuses, throat, trachea, and bronchi. Bronchi are the airways leading to the lungs. Most people improve within 1 week, but symptoms can last up  to 2 weeks. A residual cough may last even longer.  CAUSES Many different viruses can infect the tissues lining the upper respiratory tract. The tissues become irritated and inflamed and often become very moist. Mucus production is also common. A cold is contagious. You can easily spread the virus to others by oral contact. This includes kissing, sharing a glass, coughing, or sneezing. Touching your mouth or nose and then touching a surface, which is then touched by another person, can also spread the virus. SYMPTOMS  Symptoms typically develop 1 to 3 days after you come in contact with a cold virus. Symptoms vary from person to person. They may include:  Runny nose.  Sneezing.  Nasal congestion.  Sinus  irritation.  Sore throat.  Loss of voice (laryngitis).  Cough.  Fatigue.  Muscle aches.  Loss of appetite.  Headache.  Low-grade fever. DIAGNOSIS  You might diagnose your own cold based on familiar symptoms, since most people get a cold 2 to 3 times a year. Your caregiver can confirm this based on your exam. Most importantly, your caregiver can check that your symptoms are not due to another disease such as strep throat, sinusitis, pneumonia, asthma, or epiglottitis. Blood tests, throat tests, and X-rays are not necessary to diagnose a common cold, but they may sometimes be helpful in excluding other more serious diseases. Your caregiver will decide if any further tests are required. RISKS AND COMPLICATIONS  You may be at risk for a more severe case of the common cold if you smoke cigarettes, have chronic heart disease (such as heart failure) or lung disease (such as asthma), or if you have a weakened immune system. The very young and very old are also at risk for more serious infections. Bacterial sinusitis, middle ear infections, and bacterial pneumonia can complicate the common cold. The common cold can worsen asthma and chronic obstructive pulmonary disease (COPD). Sometimes, these complications can require emergency medical care and may be life-threatening. PREVENTION  The best way to protect against getting a cold is to practice good hygiene. Avoid oral or hand contact with people with cold symptoms. Wash your hands often if contact occurs. There is no clear evidence that vitamin C, vitamin E, echinacea, or exercise reduces the chance of developing a cold. However, it is always recommended to get plenty of rest and practice good nutrition. TREATMENT  Treatment is directed at relieving symptoms. There is no cure. Antibiotics are not effective, because the infection is caused by a virus, not by bacteria. Treatment may include:  Increased fluid intake. Sports drinks offer valuable  electrolytes, sugars, and fluids.  Breathing heated mist or steam (vaporizer or shower).  Eating chicken soup or other clear broths, and maintaining good nutrition.  Getting plenty of rest.  Using gargles or lozenges for comfort.  Controlling fevers with ibuprofen or acetaminophen as directed by your caregiver.  Increasing usage of your inhaler if you have asthma. Zinc gel and zinc lozenges, taken in the first 24 hours of the common cold, can shorten the duration and lessen the severity of symptoms. Pain medicines may help with fever, muscle aches, and throat pain. A variety of non-prescription medicines are available to treat congestion and runny nose. Your caregiver can make recommendations and may suggest nasal or lung inhalers for other symptoms.  HOME CARE INSTRUCTIONS   Only take over-the-counter or prescription medicines for pain, discomfort, or fever as directed by your caregiver.  Use a warm mist humidifier or inhale steam from a shower  to increase air moisture. This may keep secretions moist and make it easier to breathe.  Drink enough water and fluids to keep your urine clear or pale yellow.  Rest as needed.  Return to work when your temperature has returned to normal or as your caregiver advises. You may need to stay home longer to avoid infecting others. You can also use a face mask and careful hand washing to prevent spread of the virus. SEEK MEDICAL CARE IF:   After the first few days, you feel you are getting worse rather than better.  You need your caregiver's advice about medicines to control symptoms.  You develop chills, worsening shortness of breath, or brown or red sputum. These may be signs of pneumonia.  You develop yellow or brown nasal discharge or pain in the face, especially when you bend forward. These may be signs of sinusitis.  You develop a fever, swollen neck glands, pain with swallowing, or white areas in the back of your throat. These may be signs  of strep throat. SEEK IMMEDIATE MEDICAL CARE IF:   You have a fever.  You develop severe or persistent headache, ear pain, sinus pain, or chest pain.  You develop wheezing, a prolonged cough, cough up blood, or have a change in your usual mucus (if you have chronic lung disease).  You develop sore muscles or a stiff neck. Document Released: 08/02/2000 Document Revised: 05/01/2011 Document Reviewed: 06/10/2010 [facility] Patient Information 2014 [place], [place].

## 2012-11-29 ENCOUNTER — Ambulatory Visit: Payer: Self-pay | Admitting: Family Medicine

## 2013-01-09 ENCOUNTER — Telehealth: Payer: Self-pay | Admitting: Family Medicine

## 2013-01-09 ENCOUNTER — Ambulatory Visit (INDEPENDENT_AMBULATORY_CARE_PROVIDER_SITE_OTHER): Payer: BC Managed Care – PPO | Admitting: Family Medicine

## 2013-01-09 ENCOUNTER — Encounter: Payer: Self-pay | Admitting: Family Medicine

## 2013-01-09 VITALS — BP 120/90 | HR 109 | Temp 98.2°F | Ht 66.5 in | Wt 170.5 lb

## 2013-01-09 DIAGNOSIS — E860 Dehydration: Secondary | ICD-10-CM

## 2013-01-09 DIAGNOSIS — K529 Noninfective gastroenteritis and colitis, unspecified: Secondary | ICD-10-CM | POA: Insufficient documentation

## 2013-01-09 DIAGNOSIS — R509 Fever, unspecified: Secondary | ICD-10-CM | POA: Insufficient documentation

## 2013-01-09 DIAGNOSIS — R197 Diarrhea, unspecified: Secondary | ICD-10-CM

## 2013-01-09 NOTE — Patient Instructions (Addendum)
Push fluids.. Rehydrating.  Try to get back to regular diet. Tylenol for headache.  We will call with labs.  Stop at front desk for GI referral.

## 2013-01-09 NOTE — Assessment & Plan Note (Signed)
Likely causing headache. Push fluids.

## 2013-01-09 NOTE — Progress Notes (Signed)
Pre-visit discussion using our clinic review tool. No additional management support is needed unless otherwise documented below in the visit note.  

## 2013-01-09 NOTE — Progress Notes (Signed)
  Subjective:    Patient ID: April Stephenson, female    DOB: April 15, 1960, 52 y.o.   MRN: [id number]  HPI  52 year old female with off and on history of diarrhea in last year. Seen in 4 /2014 by Dr. Patsy Lager for similar .Marland Kitchen Told to use metamucil. Had neg Cdiff and neg stool culture. Symptoms had resolved for several months, then recurred in last week. She has been having 3-5 BMs a  Day.. Watery. No blood in stool. Occ greenish appearance. No abdominal pain, occ nausea, no vomiting.  Decreased appetitie, drinking gatorade and crackers. Temp yesterday 101.  She also has a headache.  Her rectum is resultantly raw.  Immodium helps some. Pepto bismol does not help.  Tylenol and tramadol have not helped headache.  Mother with chronic diarrhea as well... ? Cause. Mother had colon cancer: in her 31's  Uncle in 47's but had colon cancer  Had a colonoscopy 3 years ago - at the [facility], and normal. F/u 5 years.  No recent travel out of country, has well, no stream water drinking. No recent antibiotics.    Review of Systems  Constitutional: Negative for fever and fatigue.  HENT: Negative for ear pain.   Eyes: Negative for pain.  Respiratory: Negative for chest tightness and shortness of breath.   Cardiovascular: Negative for chest pain, palpitations and leg swelling.  Gastrointestinal: Positive for diarrhea and rectal pain. Negative for abdominal pain.  Genitourinary: Negative for dysuria.       Objective:   Physical Exam  Constitutional: Vital signs are normal. She appears well-developed and well-nourished. She is cooperative.  Non-toxic appearance. She does not appear ill. No distress.  HENT:  Head: Normocephalic.  Right Ear: Hearing, tympanic membrane, external ear and ear canal normal. Tympanic membrane is not erythematous, not retracted and not bulging.  Left Ear: Hearing, tympanic membrane, external ear and ear canal normal. Tympanic membrane is not erythematous, not  retracted and not bulging.  Nose: No mucosal edema or rhinorrhea. Right sinus exhibits no maxillary sinus tenderness and no frontal sinus tenderness. Left sinus exhibits no maxillary sinus tenderness and no frontal sinus tenderness.  Mouth/Throat: Uvula is midline, oropharynx is clear and moist and mucous membranes are normal.  Eyes: Conjunctivae, EOM and lids are normal. Pupils are equal, round, and reactive to light. Lids are everted and swept, no foreign bodies found.  Neck: Trachea normal and normal range of motion. Neck supple. Carotid bruit is not present. No mass and no thyromegaly present.  Cardiovascular: Normal rate, regular rhythm, S1 normal, S2 normal, normal heart sounds, intact distal pulses and normal pulses.  Exam reveals no gallop and no friction rub.   No murmur heard. Pulmonary/Chest: Effort normal and breath sounds normal. Not tachypneic. No respiratory distress. She has no decreased breath sounds. She has no wheezes. She has no rhonchi. She has no rales.  Abdominal: Soft. Normal appearance and bowel sounds are normal. There is no hepatosplenomegaly. There is generalized tenderness. There is no CVA tenderness.  Diffuse  B lowe r abdominal pain.. Greatest on left lower quadrant  Neurological: She is alert.  Skin: Skin is warm, dry and intact. No rash noted.  Psychiatric: Her speech is normal and behavior is normal. Judgment and thought content normal. Her mood appears not anxious. Cognition and memory are normal. She does not exhibit a depressed mood.          Assessment & Plan:

## 2013-01-09 NOTE — Progress Notes (Deleted)
  Subjective:    Patient ID: April Stephenson, female    DOB: Aug 30, 1960, 52 y.o.   MRN: [id number]  Diarrhea  This is a new problem. Pertinent negatives include no weight loss.      Review of Systems  Constitutional: Negative for weight loss.  Gastrointestinal: Positive for diarrhea.       Objective:   Physical Exam        Assessment & Plan:

## 2013-01-09 NOTE — Telephone Encounter (Signed)
Patient Information:  Caller Name: Trivia  Phone: [phone]  Patient: April Stephenson, April Stephenson  Gender: Female  DOB: Aug 23, 1960  Age: 52 Years  PCP: Crawford Givens Clelia Croft) [facility])  Pregnant: No  Office Follow Up:  Does the office need to follow up with this patient?: No  Instructions For The Office: N/A   Symptoms  Reason For Call & Symptoms: Patient calling about diarrhea; no vomiting.  Feels full in chest; no cough; hoarse.  Headache "for days".  Sleep and Tylenol for headache with relief.   Estimates 3-6 watery brown stools per day.  Denies body aches.  Temp up to 100.9 on 01/08/13.  Caller requests appointment; states she needs note for missing work. At conclusion of triage she reports new onset of muscle aches 01/09/13.  Emergent symptoms ruled out.  See Within 3 Days in Office per Diarrhea guideline due to Patient wants to be seen.  Home care for the interim  and parameters for callback given.  Reviewed Health History In EMR: Yes  Reviewed Medications In EMR: Yes  Reviewed Allergies In EMR: Yes  Reviewed Surgeries / Procedures: Yes  Date of Onset of Symptoms: 01/06/2013  Treatments Tried: Loperamide - worked for one day.  Treatments Tried Worked: No OB / GYN:  LMP: Unknown  Guideline(s) Used:  Diarrhea  Disposition Per Guideline:   See Within 3 Days in Office  Reason For Disposition Reached:   Patient wants to be seen  Advice Given:  Reassurance:  In healthy adults, new-onset diarrhea is usually caused by a viral infection of the intestines, which you can treat at home. Diarrhea is the body's way of getting rid of the infection. Here are some tips on how to keep ahead of the fluid losses.  Here is some care advice that should help.  Nutrition:  Maintaining some food intake during episodes of diarrhea is important.  Ideal initial foods include boiled starches/cereals (e.g., potatoes, rice, noodles, wheat, oats) with a small amount of salt to taste.  Other  acceptable foods include: bananas, yogurt, crackers, soup.  As your stools return to normal consistency, resume a normal diet.  Diarrhea Medication  - Imodium AD:   Maximum dosage: 16 mg (8 capsules or 16 teaspoons or 80 ml).  Caution: Do not use if you have a fever greater than 100F (37.8C). Do not use if there is blood or mucus in your stools. Do not use for more than 2 days.  Expected Course:  Viral diarrhea lasts 4-7 days. Always worse on days 1 and 2.  Patient Will Follow Care Advice:  YES  Appointment Scheduled:  01/09/2013 14:45:00 Appointment Scheduled Provider:  Kerby Nora [facility])

## 2013-01-09 NOTE — Addendum Note (Signed)
Addended by: Alvina Chou on: 01/09/2013 04:02 PM   Modules accepted: Orders

## 2013-01-09 NOTE — Addendum Note (Signed)
Addended by: Alvina Chou on: 01/09/2013 03:44 PM   Modules accepted: Orders

## 2013-01-09 NOTE — Telephone Encounter (Signed)
Agree with disposition. 

## 2013-01-09 NOTE — Assessment & Plan Note (Signed)
Concern for infectious course given fever per pt  Yesterday.. None now.  ? Acute gastroenteritis... But pt insists this is more chronic in nature.   Will send cbc to eval for wbc elevation, stool culture was neg ( in 05/2012) will recheck Cdiff. Will also eval for celiac disaese, thyroid disease and refer to GI goiven recurrent severe symptoms.

## 2013-01-10 ENCOUNTER — Encounter: Payer: Self-pay | Admitting: Gastroenterology

## 2013-01-10 LAB — COMPREHENSIVE METABOLIC PANEL
Albumin: 4.2 g/dL (ref 3.5–5.2)
Alkaline Phosphatase: 116 U/L (ref 39–117)
BUN: 8 mg/dL (ref 6–23)
CO2: 24 mEq/L (ref 19–32)
GFR: 87.26 mL/min (ref >60.00)
Glucose, Bld: 101 mg/dL — ABNORMAL HIGH (ref 70–99)
Total Bilirubin: 0.6 mg/dL (ref 0.3–1.2)

## 2013-01-10 LAB — CBC WITH DIFFERENTIAL/PLATELET
Basophils Relative: 0.3 % (ref 0.0–3.0)
Eosinophils Relative: 1.1 % (ref 0.0–5.0)
HCT: 45 % (ref 36.0–46.0)
MCV: 94 fl (ref 78.0–100.0)
Monocytes Absolute: 0.4 10*3/uL (ref 0.1–1.0)
Monocytes Relative: 3.8 % (ref 3.0–12.0)
Neutrophils Relative %: 71.4 % (ref 43.0–77.0)
Platelets: 327 10*3/uL (ref 150.0–400.0)
RBC: 4.79 Mil/uL (ref 3.87–5.11)
WBC: 10.7 10*3/uL — ABNORMAL HIGH (ref 4.5–10.5)

## 2013-01-10 LAB — CELIAC PANEL 10
Endomysial Screen: NEGATIVE
Gliadin IgA: 3.9 U/mL (ref <20)
Gliadin IgG: 13.5 U/mL (ref <20)
IgA: 395 mg/dL — ABNORMAL HIGH (ref 69–380)
Tissue Transglut Ab: 9.6 U/mL (ref <20)

## 2013-02-17 ENCOUNTER — Ambulatory Visit: Payer: Self-pay | Admitting: Gastroenterology

## 2013-07-31 ENCOUNTER — Emergency Department (HOSPITAL_COMMUNITY): Payer: Federal, State, Local not specified - PPO

## 2013-07-31 ENCOUNTER — Encounter (HOSPITAL_COMMUNITY): Payer: Self-pay | Admitting: Emergency Medicine

## 2013-07-31 ENCOUNTER — Inpatient Hospital Stay (HOSPITAL_COMMUNITY)
Admission: EM | Admit: 2013-07-31 | Discharge: 2013-08-05 | DRG: 917 | Disposition: A | Discharge status: Home / Self Care | Payer: Federal, State, Local not specified - PPO | Attending: Internal Medicine | Admitting: Internal Medicine

## 2013-07-31 DIAGNOSIS — Z6828 Body mass index (BMI) 28.0-28.9, adult: Secondary | ICD-10-CM

## 2013-07-31 DIAGNOSIS — Z8541 Personal history of malignant neoplasm of cervix uteri: Secondary | ICD-10-CM

## 2013-07-31 DIAGNOSIS — G929 Unspecified toxic encephalopathy: Secondary | ICD-10-CM | POA: Diagnosis present

## 2013-07-31 DIAGNOSIS — Z8249 Family history of ischemic heart disease and other diseases of the circulatory system: Secondary | ICD-10-CM

## 2013-07-31 DIAGNOSIS — Z87891 Personal history of nicotine dependence: Secondary | ICD-10-CM

## 2013-07-31 DIAGNOSIS — G8929 Other chronic pain: Secondary | ICD-10-CM | POA: Diagnosis present

## 2013-07-31 DIAGNOSIS — F1010 Alcohol abuse, uncomplicated: Secondary | ICD-10-CM | POA: Diagnosis present

## 2013-07-31 DIAGNOSIS — G43009 Migraine without aura, not intractable, without status migrainosus: Secondary | ICD-10-CM | POA: Diagnosis present

## 2013-07-31 DIAGNOSIS — Y92009 Unspecified place in unspecified non-institutional (private) residence as the place of occurrence of the external cause: Secondary | ICD-10-CM

## 2013-07-31 DIAGNOSIS — F332 Major depressive disorder, recurrent severe without psychotic features: Secondary | ICD-10-CM | POA: Diagnosis present

## 2013-07-31 DIAGNOSIS — E860 Dehydration: Secondary | ICD-10-CM

## 2013-07-31 DIAGNOSIS — N179 Acute kidney failure, unspecified: Secondary | ICD-10-CM | POA: Diagnosis present

## 2013-07-31 DIAGNOSIS — M6282 Rhabdomyolysis: Secondary | ICD-10-CM | POA: Diagnosis present

## 2013-07-31 DIAGNOSIS — N390 Urinary tract infection, site not specified: Secondary | ICD-10-CM | POA: Diagnosis present

## 2013-07-31 DIAGNOSIS — F32A Depression, unspecified: Secondary | ICD-10-CM | POA: Diagnosis present

## 2013-07-31 DIAGNOSIS — T48201A Poisoning by unspecified drugs acting on muscles, accidental (unintentional), initial encounter: Secondary | ICD-10-CM | POA: Diagnosis present

## 2013-07-31 DIAGNOSIS — T481X4A Poisoning by skeletal muscle relaxants [neuromuscular blocking agents], undetermined, initial encounter: Principal | ICD-10-CM | POA: Diagnosis present

## 2013-07-31 DIAGNOSIS — M549 Dorsalgia, unspecified: Secondary | ICD-10-CM | POA: Diagnosis present

## 2013-07-31 DIAGNOSIS — G479 Sleep disorder, unspecified: Secondary | ICD-10-CM | POA: Diagnosis present

## 2013-07-31 DIAGNOSIS — G92 Toxic encephalopathy: Secondary | ICD-10-CM | POA: Diagnosis present

## 2013-07-31 DIAGNOSIS — G43909 Migraine, unspecified, not intractable, without status migrainosus: Secondary | ICD-10-CM | POA: Diagnosis present

## 2013-07-31 DIAGNOSIS — F329 Major depressive disorder, single episode, unspecified: Secondary | ICD-10-CM

## 2013-07-31 DIAGNOSIS — G4700 Insomnia, unspecified: Secondary | ICD-10-CM | POA: Diagnosis present

## 2013-07-31 DIAGNOSIS — G9340 Encephalopathy, unspecified: Secondary | ICD-10-CM | POA: Diagnosis present

## 2013-07-31 DIAGNOSIS — Z79899 Other long term (current) drug therapy: Secondary | ICD-10-CM

## 2013-07-31 DIAGNOSIS — T50901A Poisoning by unspecified drugs, medicaments and biological substances, accidental (unintentional), initial encounter: Secondary | ICD-10-CM

## 2013-07-31 HISTORY — DX: Poisoning by unspecified drugs, medicaments and biological substances, intentional self-harm, initial encounter: T50.902A

## 2013-07-31 LAB — CBC
HEMATOCRIT: 40.5 % (ref 36.0–46.0)
HEMOGLOBIN: 13.4 g/dL (ref 12.0–15.0)
MCH: 31.5 pg (ref 26.0–34.0)
MCHC: 33.1 g/dL (ref 30.0–36.0)
MCV: 95.3 fL (ref 78.0–100.0)
Platelets: 242 10*3/uL (ref 150–400)
RBC: 4.25 MIL/uL (ref 3.87–5.11)
RDW: 13.6 % (ref 11.5–15.5)
WBC: 15.9 10*3/uL — ABNORMAL HIGH (ref 4.0–10.5)

## 2013-07-31 LAB — ETHANOL

## 2013-07-31 LAB — URINALYSIS, ROUTINE W REFLEX MICROSCOPIC
Bilirubin Urine: NEGATIVE
GLUCOSE, UA: NEGATIVE mg/dL
Ketones, ur: NEGATIVE mg/dL
Nitrite: NEGATIVE
Protein, ur: NEGATIVE mg/dL
SPECIFIC GRAVITY, URINE: 1.016 (ref 1.005–1.030)
UROBILINOGEN UA: 0.2 mg/dL (ref 0.0–1.0)
pH: 5 (ref 5.0–8.0)

## 2013-07-31 LAB — COMPREHENSIVE METABOLIC PANEL
ALT: 53 U/L — ABNORMAL HIGH (ref 0–35)
AST: 84 U/L — ABNORMAL HIGH (ref 0–37)
Albumin: 4 g/dL (ref 3.5–5.2)
Alkaline Phosphatase: 120 U/L — ABNORMAL HIGH (ref 39–117)
BUN: 30 mg/dL — AB (ref 6–23)
CALCIUM: 9.6 mg/dL (ref 8.4–10.5)
CO2: 23 mEq/L (ref 19–32)
Chloride: 107 mEq/L (ref 96–112)
Creatinine, Ser: 1.65 mg/dL — ABNORMAL HIGH (ref 0.50–1.10)
GFR calc Af Amer: 40 mL/min — ABNORMAL LOW (ref >90)
GFR calc non Af Amer: 34 mL/min — ABNORMAL LOW (ref >90)
GLUCOSE: 90 mg/dL (ref 70–99)
Potassium: 4 mEq/L (ref 3.7–5.3)
Sodium: 146 mEq/L (ref 137–147)
TOTAL PROTEIN: 7.8 g/dL (ref 6.0–8.3)
Total Bilirubin: 0.6 mg/dL (ref 0.3–1.2)

## 2013-07-31 LAB — RAPID URINE DRUG SCREEN, HOSP PERFORMED
Amphetamines: NOT DETECTED
Barbiturates: NOT DETECTED
Benzodiazepines: POSITIVE — AB
Cocaine: NOT DETECTED
OPIATES: NOT DETECTED
Tetrahydrocannabinol: NOT DETECTED

## 2013-07-31 LAB — ACETAMINOPHEN LEVEL: Acetaminophen (Tylenol), Serum: <15 ug/mL (ref 10–30)

## 2013-07-31 LAB — TROPONIN I: Troponin I: <0.3 ng/mL (ref <0.30)

## 2013-07-31 LAB — URINE MICROSCOPIC-ADD ON

## 2013-07-31 LAB — CK: Total CK: 11557 U/L — ABNORMAL HIGH (ref 7–177)

## 2013-07-31 LAB — AMMONIA: Ammonia: 12 umol/L (ref 11–60)

## 2013-07-31 LAB — SALICYLATE LEVEL

## 2013-07-31 LAB — CBG MONITORING, ED: GLUCOSE-CAPILLARY: 83 mg/dL (ref 70–99)

## 2013-07-31 MED ORDER — SODIUM CHLORIDE 0.9 % IV BOLUS (SEPSIS)
2000.0000 mL | Freq: Once | INTRAVENOUS | Status: AC
  Administered 2013-07-31: 2000 mL via INTRAVENOUS

## 2013-07-31 MED ORDER — DEXTROSE 5 % IV SOLN
1.0000 g | INTRAVENOUS | Status: DC
  Administered 2013-08-01 – 2013-08-02 (×2): 1 g via INTRAVENOUS
  Filled 2013-07-31 (×2): qty 10

## 2013-07-31 MED ORDER — ALUM & MAG HYDROXIDE-SIMETH 200-200-20 MG/5ML PO SUSP: 30.0000 mL | Freq: Four times a day (QID) | ORAL | Status: DC | PRN

## 2013-07-31 MED ORDER — DIAZEPAM 2 MG PO TABS
2.0000 mg | ORAL_TABLET | Freq: Four times a day (QID) | ORAL | Status: DC | PRN
  Administered 2013-08-02 – 2013-08-03 (×2): 2 mg via ORAL
  Filled 2013-07-31 (×2): qty 1

## 2013-07-31 MED ORDER — ACETYLCYSTEINE LOAD VIA INFUSION
150.0000 mg/kg | Freq: Once | INTRAVENOUS | Status: DC
  Filled 2013-07-31: qty 290

## 2013-07-31 MED ORDER — SODIUM BICARBONATE 8.4 % IV SOLN
INTRAVENOUS | Status: DC
  Administered 2013-07-31 – 2013-08-01 (×3): via INTRAVENOUS
  Filled 2013-07-31 (×6): qty 100

## 2013-07-31 MED ORDER — CEFTRIAXONE SODIUM 1 G IJ SOLR
1.0000 g | Freq: Once | INTRAMUSCULAR | Status: AC
  Administered 2013-07-31: 1 g via INTRAVENOUS
  Filled 2013-07-31: qty 10

## 2013-07-31 MED ORDER — ENOXAPARIN SODIUM 40 MG/0.4ML ~~LOC~~ SOLN
40.0000 mg | SUBCUTANEOUS | Status: DC
  Administered 2013-07-31 – 2013-08-04 (×5): 40 mg via SUBCUTANEOUS
  Filled 2013-07-31 (×7): qty 0.4

## 2013-07-31 MED ORDER — POLYETHYLENE GLYCOL 3350 17 G PO PACK
17.0000 g | PACK | Freq: Every day | ORAL | Status: DC | PRN
  Filled 2013-07-31: qty 1

## 2013-07-31 MED ORDER — ACETYLCYSTEINE 200 MG/ML IV SOLN
15.0000 mg/kg/h | INTRAVENOUS | Status: DC
  Administered 2013-07-31: 15 mg/kg/h via INTRAVENOUS
  Filled 2013-07-31: qty 200

## 2013-07-31 MED ORDER — ACETAMINOPHEN 650 MG RE SUPP: 650.0000 mg | Freq: Four times a day (QID) | RECTAL | Status: DC | PRN

## 2013-07-31 MED ORDER — SODIUM CHLORIDE 0.9 % IV BOLUS (SEPSIS): 1000.0000 mL | Freq: Once | INTRAVENOUS | Status: DC

## 2013-07-31 MED ORDER — ACETAMINOPHEN 325 MG PO TABS: 650.0000 mg | ORAL_TABLET | Freq: Four times a day (QID) | ORAL | Status: DC | PRN

## 2013-07-31 NOTE — ED Notes (Signed)
Family at bedside. 

## 2013-07-31 NOTE — H&P (Addendum)
[facility] History and Physical  April Stephenson GTX:[id number] DOB: 20-May-1960 DOA: 07/31/2013  Referring physician: Dr Betsey Holiday PCP: [facility]   Chief Complaint: brought in by daughter for confusion/ stumbling gait  HPI: April Stephenson is a 53 y.o. female found confused at 10 PM last night. Because this has happened before, the daughter Mateo Flow) thought she would sleep it off but she was confused still this AM. The Daughter tried to lay her on the couch multiple times but she kept falling on the floor and eventually slept sitting up on the floor overnight. She has in the past often mixed medications with alcohol. She is ETOH negative now. Because she was still confused this morning, the daughter decided to bring her into the hospital. Here she is found to have ARF and rhabdomyolysis and possibly a UTI. 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 Daughter state she was missing work days.   ROS is unreliable due to confusion.    Past Medical History  Diagnosis Date  . Migraine with aura   . Cancer     cervical cancer  . Depression     per Dr. Silvio Pate with psych, h/o suicide attempt at 53 y/o  . Arthritis     with longstanding foot and back pain after injuries, followed by Dr. Sharol Given  . Impingement syndrome of left shoulder 06/16/2011  . Suicidal overdose    Past Surgical History  Procedure Laterality Date  . Tonsillectomy    . Foot surgery      R foot-great toe  . Abdominal hysterectomy  2000    TAH/BSO  . Colonoscopy    . Shoulder surgery      2013   Social History:  reports that she has quit smoking. She has never used smokeless tobacco. Drinks occassionally. She reports that she does not use illicit drugs. Lives at home with daughter Good with ADLs  Allergies  Allergen Reactions  . Diclofenac Sodium Other (See Comments)    Nausea Per pt can tolerate toradol IM fine  . Venlafaxine     syncope  . Tramadol     dizzy    Family History   Problem Relation Age of Onset  . Cancer Mother   . Depression Mother   . Cancer Father   . Heart disease Father       Prior to Admission medications -- MED rec has not been reconciled- this is inaccurate  Medication Sig Start Date End Date Taking? Authorizing Provider  ARIPiprazole (ABILIFY) 2 MG tablet Take 2 mg by mouth daily.      Historical Provider, MD  diazepam (VALIUM) 5 MG tablet Take 5 mg by mouth every 6 (six) hours as needed. For sleep or anxiety    Historical Provider, MD  sertraline (ZOLOFT) 100 MG tablet Take 100 mg by mouth daily.     Historical Provider, MD  SUMAtriptan (IMITREX) 25 MG tablet Take 25 mg by mouth every 2 (two) hours as needed for migraine (take 25 mg at start of headache may repeat in 2 hours if needed for migraine).    Historical Provider, MD  traMADol (ULTRAM) 50 MG tablet Take 1 tablet (50 mg total) by mouth every 6 (six) hours as needed for pain. 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 Does not know the day or date. Oriented to place and person only HEENT: Normocephalic and Atraumatic, Mucous membranes pink                PERRLA; EOM intact; No scleral icterus,                 Nares: Patent, Oropharynx- very dry                Neck: FROM, no cervical lymphadenopathy, thyromegaly, carotid bruit or JVD;  Breasts: deferred CHEST WALL: No tenderness  CHEST: Normal respiration, clear to auscultation bilaterally  HEART: Regular rate and rhythm; no murmurs rubs or gallops  BACK: No kyphosis or scoliosis; no CVA tenderness  ABDOMEN: Positive Bowel Sounds, soft, non-tender; no masses, no organomegaly Rectal Exam: deferred EXTREMITIES: No cyanosis,  clubbing, or edema Genitalia: not examined  SKIN:  no rash or ulceration  CNS: Alert and Oriented x 4, Nonfocal exam, CN 2-12 intact  Labs on Admission:  Basic Metabolic Panel:  Recent Labs Lab 07/31/13 1353  NA 146  K 4.0  CL 107  CO2 23  GLUCOSE 90  BUN 30*  CREATININE 1.65*  CALCIUM 9.6   Liver Function Tests:  Recent Labs Lab 07/31/13 1353  AST 84*  ALT 53*  ALKPHOS 120*  BILITOT 0.6  PROT 7.8  ALBUMIN 4.0   No results found for this basename: LIPASE, AMYLASE,  in the last 168 hours No results found for this basename: AMMONIA,  in the last 168 hours CBC:  Recent Labs Lab 07/31/13 1353  WBC 15.9*  HGB 13.4  HCT 40.5  MCV 95.3  PLT 242   Cardiac Enzymes:  Recent Labs Lab 07/31/13 1353  CKTOTAL 71062*  TROPONINI <0.30    BNP (last 3 results) No results found for this basename: PROBNP,  in the last 8760 hours CBG:  Recent Labs Lab 07/31/13 1144  GLUCAP 83    Radiological Exams on Admission: Dg Chest 1 View  07/31/2013   CLINICAL DATA:  Drug overdose  EXAM: CHEST - 1 VIEW  COMPARISON:  None.  FINDINGS: The heart size and mediastinal contours are within normal limits. Both lungs are clear. The visualized skeletal structures are unremarkable.  IMPRESSION: No active disease.   Electronically Signed   By: Franchot Gallo M.D.   On: 07/31/2013 13:14   Ct Head Wo Contrast  07/31/2013   CLINICAL DATA:  Mental status changes.  EXAM: CT HEAD WITHOUT CONTRAST  TECHNIQUE: Contiguous axial images were obtained from the base of the skull through the vertex without intravenous contrast.  COMPARISON:  04/05/2006.  FINDINGS: The ventricles are normal in size and configuration. No extra-axial fluid collections are identified. The gray-white differentiation is normal. No CT findings for acute intracranial process such as hemorrhage or infarction. No mass lesions. The brainstem and cerebellum are grossly normal.  The bony structures are intact. The paranasal sinuses  and mastoid air cells are clear. The globes are intact.  IMPRESSION: No acute intracranial findings or mass lesion.   Electronically Signed   By: Kalman Jewels M.D.   On: 07/31/2013 14:22    EKG: Independently reviewed. Sinus rhythm  Assessment/Plan Principal Problem:   Encephalopathy acute - based  on prior history, she likely took too much medications- of note her Flexeril bottle prescribed on April 23 is empty- if she was taking it as prescribed there should be 22 tabs left- she may have overdosed on all or she may have taken extra doses on other days and therefore ran out early- it is difficult to tell- her daughter has brought all of her bottles and this is the only empty one- pat states she may have taken 6 together yesterday but she is quite confused which make her very unreliable - she does have a UTI with leukocytosis which may be the source of her confusion - she has ARF and may be uremic - will check ammonia level along with B 12 - CT head negative, UDS positive for Benzos- she takes Valium- may also have overdosed on this - will admit to med/surg - sitter at bedside   Active Problems:   Depression -hold home meds tonight except Valium-  will cut back on dose of Valium- will not d/c completely to prevent withdrawl    Rhabdomyolysis/ ARF  - hydrate with IV Bicarb/ NS - follow lab work  UTI - Rocephin/ Urine Culture  Addendum- poison control called by ER- as LFTs are (mildly) elevated, recommended to start IV mucumyst for possible Tylenol ingestion- will order.    Consulted: none  Code Status: Full code  Family Communication: Daughter  Disposition Plan: 2-3 day stay    Time spent: > 35 min  [place], MD [facility]  If 7PM-7AM, please contact night-coverage [email or web address] 07/31/2013, 4:38 PM

## 2013-07-31 NOTE — ED Notes (Signed)
Per [facility]- daughter called for evaluation of pt- Pt took cyclobenzaprine HCL 10mg  2 tablets last night and 2 tablet this am. Denies SI/HI. Appears lethargic and not answering all questionings appropriate. Garbled speech with fleeting thoughts. Pt stated she vomited today. Appears Hallucinating.

## 2013-07-31 NOTE — ED Provider Notes (Signed)
CSN: [id number]     Arrival date & time 07/31/13  1128 History   First MD Initiated Contact with Patient 07/31/13 1200     Chief Complaint  Patient presents with  . Drug Overdose     (Consider location/radiation/quality/duration/timing/severity/associated sxs/prior Treatment) HPI Comments: Patient brought to the ER for evaluation of confusion. Patient comes to the ER by ambulance from home. Patient's daughter apparently checked on her today and found her totally confused. She told the daughter that she had taken 2 Flexeril tablets last night and then to again this morning. Daughter checked the bottle, however, and it was empty. There should have been enough medication in the bottle to get her through June 20.  Upon arrival to the ER, patient is extremely confused and inappropriate in her responses. Level V Caveat due to mental status change/confusion.  Patient is a 53 y.o. female presenting with Overdose.  Drug Overdose    Past Medical History  Diagnosis Date  . Migraine with aura   . Cancer     cervical cancer  . Depression     per Dr. Silvio Pate with psych, h/o suicide attempt at 53 y/o  . Arthritis     with longstanding foot and back pain after injuries, followed by Dr. Sharol Given  . Impingement syndrome of left shoulder 06/16/2011  . Suicidal overdose    Past Surgical History  Procedure Laterality Date  . Tonsillectomy    . Foot surgery      R foot-great toe  . Abdominal hysterectomy  2000    TAH/BSO  . Colonoscopy    . Shoulder surgery      2013   Family History  Problem Relation Age of Onset  . Cancer Mother   . Depression Mother   . Cancer Father   . Heart disease Father    History  Substance Use Topics  . Smoking status: Former Research scientist (life sciences)  . Smokeless tobacco: Never Used  . Alcohol Use: Yes     Comment: occassionally   OB History   Grav Para Term Preterm Abortions TAB SAB Ect Mult Living                 Review of Systems  Unable to perform ROS: Mental status  change      Allergies  Diclofenac sodium; Venlafaxine; and Tramadol  Home Medications   Prior to Admission medications   Medication Sig Start Date End Date Taking? Authorizing Provider  ARIPiprazole (ABILIFY) 2 MG tablet Take 2 mg by mouth daily.      Historical Provider, MD  diazepam (VALIUM) 5 MG tablet Take 5 mg by mouth every 6 (six) hours as needed. For sleep or anxiety    Historical Provider, MD  sertraline (ZOLOFT) 100 MG tablet Take 100 mg by mouth daily.     Historical Provider, MD  SUMAtriptan (IMITREX) 25 MG tablet Take 25 mg by mouth every 2 (two) hours as needed for migraine (take 25 mg at start of headache may repeat in 2 hours if needed for migraine).    Historical Provider, MD  traMADol (ULTRAM) 50 MG tablet Take 1 tablet (50 mg total) by mouth every 6 (six) hours as needed for pain. 06/06/12   Spencer Copland, MD   BP 120/84  Pulse 100  Temp(Src) 98.4 F (36.9 C) (Oral)  Resp 17  SpO2 97% Physical Exam  Constitutional: She appears well-developed and well-nourished. She appears lethargic. No distress.  HENT:  Head: Normocephalic and atraumatic.  Right Ear: Hearing normal.  Left Ear: Hearing normal.  Nose: Nose normal.  Mouth/Throat: Oropharynx is clear and moist. Mucous membranes are dry.  Eyes: Conjunctivae and EOM are normal. Pupils are equal, round, and reactive to light.  Neck: Normal range of motion. Neck supple.  Cardiovascular: Regular rhythm, S1 normal and S2 normal.  Tachycardia present.  Exam reveals no gallop and no friction rub.   No murmur heard. Pulmonary/Chest: Effort normal and breath sounds normal. No respiratory distress. She exhibits no tenderness.  Abdominal: Soft. Normal appearance and bowel sounds are normal. There is no hepatosplenomegaly. There is no tenderness. There is no rebound, no guarding, no tenderness at McBurney's point and negative Murphy's sign. No hernia.  Musculoskeletal: Normal range of motion.  Neurological: She has normal  strength. She appears lethargic. She is disoriented. No cranial nerve deficit or sensory deficit. Coordination normal. GCS eye subscore is 3. GCS verbal subscore is 4. GCS motor subscore is 6.  Skin: Skin is warm, dry and intact. No rash noted. No cyanosis.  Psychiatric: She has a normal mood and affect. Her speech is normal and behavior is normal. Thought content normal.    ED Course  Procedures (including critical care time) Labs Review Labs Reviewed  CBC - Abnormal; Notable for the following:    WBC 15.9 (*)    All other components within normal limits  COMPREHENSIVE METABOLIC PANEL - Abnormal; Notable for the following:    BUN 30 (*)    Creatinine, Ser 1.65 (*)    AST 84 (*)    ALT 53 (*)    Alkaline Phosphatase 120 (*)    GFR calc non Af Amer 34 (*)    GFR calc Af Amer 40 (*)    All other components within normal limits  SALICYLATE LEVEL - Abnormal; Notable for the following:    Salicylate Lvl <1.1 (*)    All other components within normal limits  URINE RAPID DRUG SCREEN (HOSP PERFORMED) - Abnormal; Notable for the following:    Benzodiazepines POSITIVE (*)    All other components within normal limits  URINALYSIS, ROUTINE W REFLEX MICROSCOPIC - Abnormal; Notable for the following:    APPearance CLOUDY (*)    Hgb urine dipstick TRACE (*)    Leukocytes, UA MODERATE (*)    All other components within normal limits  URINE MICROSCOPIC-ADD ON - Abnormal; Notable for the following:    Bacteria, UA MANY (*)    Casts HYALINE CASTS (*)    All other components within normal limits  CK - Abnormal; Notable for the following:    Total CK 11557 (*)    All other components within normal limits  URINE CULTURE  ETHANOL  ACETAMINOPHEN LEVEL  TROPONIN I  CBG MONITORING, ED    Imaging Review Dg Chest 1 View  07/31/2013   CLINICAL DATA:  Drug overdose  EXAM: CHEST - 1 VIEW  COMPARISON:  None.  FINDINGS: The heart size and mediastinal contours are within normal limits. Both lungs are  clear. The visualized skeletal structures are unremarkable.  IMPRESSION: No active disease.   Electronically Signed   By: Franchot Gallo M.D.   On: 07/31/2013 13:14   Ct Head Wo Contrast  07/31/2013   CLINICAL DATA:  Mental status changes.  EXAM: CT HEAD WITHOUT CONTRAST  TECHNIQUE: Contiguous axial images were obtained from the base of the skull through the vertex without intravenous contrast.  COMPARISON:  04/05/2006.  FINDINGS: The ventricles are normal in size and configuration. No extra-axial fluid collections are  identified. The gray-white differentiation is normal. No CT findings for acute intracranial process such as hemorrhage or infarction. No mass lesions. The brainstem and cerebellum are grossly normal.  The bony structures are intact. The paranasal sinuses and mastoid air cells are clear. The globes are intact.  IMPRESSION: No acute intracranial findings or mass lesion.   Electronically Signed   By: Kalman Jewels M.D.   On: 07/31/2013 14:22     EKG Interpretation   Date/Time:  Thursday July 31 2013 11:39:47 EDT Ventricular Rate:  103 PR Interval:  152 QRS Duration: 98 QT Interval:  354 QTC Calculation: 463 R Axis:   19 Text Interpretation:  Sinus tachycardia RSR' in V1 or V2, right VCD or RVH  Nonspecific ST abnormality No previous tracing Confirmed by Avner Stroder  MD,  Britt Petroni [id number]) on 07/31/2013 1:15:34 PM      MDM   Final diagnoses:  Overdose  Dehydration  AKI (acute kidney injury)  Rhabdomyolysis  UTI (lower urinary tract infection)   Patient brought to the ER after her daughter reported that she was acting strangely. EMS brought her to the ER, having discovered that her Flexeril bottle is empty. Overdose was considered. Patient was very altered and confused on arrival, could not provide any further information. She has slowly cleared here in the ER and now admits to taking too much for the Flexeril which was prescribed at the [place]. Further information also available.  Patient's best friend has discovered that she made multiple posts to [place] last night. These postings were in the form of suicide notes. She mentioned that she enjoyed all of her Facebook friends and basically said goodbye to them.  Tourniquet time of evaluation here in the ER, she has improved somewhat. She is now more awake and alert, interacting with her friend. Workup reveals evidence of rhabdomyolysis secondary to lying on the floor for most of the night when she was extremely sedated. She will require medical treatment for acute rhabdomyolysis followed by psychiatric evaluation when medically stable. She initiated a fluid bolus here in the ER for rhabdomyolysis and acute kidney injury, will transition to fluid with bicarbonate during hospitalization. Patient to be admitted to the hospital for further management.    Orpah Greek, MD 08/01/13 [phone]

## 2013-07-31 NOTE — ED Notes (Signed)
MD at bedside. 

## 2013-07-31 NOTE — ED Notes (Signed)
Bed: WA20 Expected date:  Expected time:  Means of arrival:  Comments: EMS-OD

## 2013-07-31 NOTE — Progress Notes (Signed)
Utilization Review completed.  Dorrie Cocuzza RN CM  

## 2013-08-01 LAB — COMPREHENSIVE METABOLIC PANEL
ALBUMIN: 2.8 g/dL — AB (ref 3.5–5.2)
ALT: 42 U/L — ABNORMAL HIGH (ref 0–35)
AST: 49 U/L — AB (ref 0–37)
Alkaline Phosphatase: 91 U/L (ref 39–117)
BUN: 18 mg/dL (ref 6–23)
CALCIUM: 8.4 mg/dL (ref 8.4–10.5)
CO2: 27 mEq/L (ref 19–32)
Chloride: 105 mEq/L (ref 96–112)
Creatinine, Ser: 0.82 mg/dL (ref 0.50–1.10)
GFR calc Af Amer: >90 mL/min (ref >90)
GFR, EST NON AFRICAN AMERICAN: 80 mL/min — AB (ref >90)
Glucose, Bld: 142 mg/dL — ABNORMAL HIGH (ref 70–99)
Potassium: 3.2 mEq/L — ABNORMAL LOW (ref 3.7–5.3)
Sodium: 145 mEq/L (ref 137–147)
Total Bilirubin: 0.5 mg/dL (ref 0.3–1.2)
Total Protein: 6.2 g/dL (ref 6.0–8.3)

## 2013-08-01 LAB — CBC
HCT: 37.2 % (ref 36.0–46.0)
Hemoglobin: 12.4 g/dL (ref 12.0–15.0)
MCH: 31.9 pg (ref 26.0–34.0)
MCHC: 33.3 g/dL (ref 30.0–36.0)
MCV: 95.6 fL (ref 78.0–100.0)
Platelets: 195 10*3/uL (ref 150–400)
RBC: 3.89 MIL/uL (ref 3.87–5.11)
RDW: 13.5 % (ref 11.5–15.5)
WBC: 9.2 10*3/uL (ref 4.0–10.5)

## 2013-08-01 LAB — CK: CK TOTAL: 6238 U/L — AB (ref 7–177)

## 2013-08-01 LAB — PROTIME-INR
INR: 1.07 (ref 0.00–1.49)
PROTHROMBIN TIME: 13.7 s (ref 11.6–15.2)

## 2013-08-01 LAB — ACETAMINOPHEN LEVEL: Acetaminophen (Tylenol), Serum: <15 ug/mL (ref 10–30)

## 2013-08-01 LAB — VITAMIN B12: Vitamin B-12: 474 pg/mL (ref 211–911)

## 2013-08-01 MED ORDER — DIAZEPAM 2 MG PO TABS
2.0000 mg | ORAL_TABLET | Freq: Every day | ORAL | Status: DC
  Administered 2013-08-01 – 2013-08-04 (×4): 2 mg via ORAL
  Filled 2013-08-01 (×4): qty 1

## 2013-08-01 MED ORDER — OXYCODONE HCL 5 MG PO TABS
5.0000 mg | ORAL_TABLET | Freq: Once | ORAL | Status: AC
  Administered 2013-08-01: 5 mg via ORAL
  Filled 2013-08-01: qty 1

## 2013-08-01 MED ORDER — DEXTROSE-NACL 5-0.9 % IV SOLN
INTRAVENOUS | Status: DC
  Administered 2013-08-01 – 2013-08-04 (×5): via INTRAVENOUS

## 2013-08-01 MED ORDER — CYANOCOBALAMIN 1000 MCG/ML IJ SOLN
1000.0000 ug | Freq: Every day | INTRAMUSCULAR | Status: DC
  Administered 2013-08-01 – 2013-08-05 (×5): 1000 ug via SUBCUTANEOUS
  Filled 2013-08-01 (×6): qty 1

## 2013-08-01 MED ORDER — POTASSIUM CHLORIDE CRYS ER 20 MEQ PO TBCR
40.0000 meq | EXTENDED_RELEASE_TABLET | ORAL | Status: AC
  Administered 2013-08-01 (×2): 40 meq via ORAL
  Filled 2013-08-01 (×2): qty 2

## 2013-08-01 MED ORDER — VITAMINS A & D EX OINT
TOPICAL_OINTMENT | CUTANEOUS | Status: AC
  Administered 2013-08-01: 5
  Filled 2013-08-01: qty 5

## 2013-08-01 NOTE — Progress Notes (Addendum)
[facility] Progress Note   April Stephenson RKY:[id number] DOB: [birth year] DOA: 07/31/2013 PCP: Elsie Stain, MD  Brief narrative: April Stephenson is a 53 y.o. female presenting on 07/31/2013 found confused at 10 PM the night. Because this has happened before, the daughter Mateo Flow) thought she would sleep it off but she was still very confused still this AM. The Daughter tried to lay her on the couch multiple times but she kept falling on the floor and eventually slept sitting up on the floor overnight. She has in the past often mixed medications with alcohol. She is ETOH negative now. Because she was still confused this morning, the daughter decided to bring her into the hospital. Here she is found to have ARF and rhabdomyolysis and possibly a UTI. 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 Daughter state she was missing work days.   Subjective: Still confused but recognizes me from yesterday  Assessment/Plan: Principal Problem:   Encephalopathy acute - possible medication overdose/ possibly due to UTI - ammonia normal - low normal B 12- can be the cause- will replace - sitter at bedside - watch on telemetry  Active Problems:   Depression - holding home meds for now due to suspicion of an overdose (except Valium- cut back on dose to prevent withdrawal)  UTI - f/u culture- cont Rocephin    Rhabdomyolysis - due to spending the night on the floor- cont to hydrate    ARF (acute renal failure) - improved with hydration- either prerenal or due to rhabdo - will stop bicarb infusion and switch to D5 NS    Code Status: Full code Family Communication: with daughter on admission Disposition Plan: to be determined  Consultants: none  Procedures: none  Antibiotics: Antibiotics Given (last 72 hours)   None       DVT prophylaxis: Lovenox  Objective: Filed Weights   07/31/13 1730 07/31/13 1839  Weight: 77.111 kg (170 lb) 78 kg (171 lb 15.3  oz)    Vitals Filed Vitals:   07/31/13 1839 07/31/13 2119 08/01/13 0643 08/01/13 1330  BP: 137/87 135/89 151/100 128/83  Pulse: 95 95 86 94  Temp: 98 F (36.7 C) 98.2 F (36.8 C) 97.8 F (36.6 C) 97.7 F (36.5 C)  TempSrc: Oral Oral Oral Oral  Resp: 18 18 18 18   Height: 5\' 7"  (1.702 m)     Weight: 78 kg (171 lb 15.3 oz)     SpO2: 98% 99% 100% 97%      Intake/Output Summary (Last 24 hours) at 08/01/13 1507 Last data filed at 08/01/13 1123  Gross per 24 hour  Intake 1969.71 ml  Output   2450 ml  Net -480.29 ml     Exam: General: No acute respiratory distress- confused Lungs: Clear to auscultation bilaterally without wheezes or crackles Cardiovascular: Regular rate and rhythm without murmur gallop or rub normal S1 and S2 Abdomen: Nontender, nondistended, soft, bowel sounds positive, no rebound, no ascites, no appreciable mass Extremities: No significant cyanosis, clubbing, or edema bilateral lower extremities  Data Reviewed: Basic Metabolic Panel:  Recent Labs Lab 07/31/13 1353 08/01/13 0413  NA 146 145  K 4.0 3.2*  CL 107 105  CO2 23 27  GLUCOSE 90 142*  BUN 30* 18  CREATININE 1.65* 0.82  CALCIUM 9.6 8.4   Liver Function Tests:  Recent Labs Lab 07/31/13 1353 08/01/13 0413  AST 84* 49*  ALT 53* 42*  ALKPHOS 120* 91  BILITOT 0.6 0.5  PROT 7.8 6.2  ALBUMIN 4.0 2.8*   No results found for this basename: LIPASE, AMYLASE,  in the last 168 hours  Recent Labs Lab 07/31/13 1722  AMMONIA 12   CBC:  Recent Labs Lab 07/31/13 1353 08/01/13 0413  WBC 15.9* 9.2  HGB 13.4 12.4  HCT 40.5 37.2  MCV 95.3 95.6  PLT 242 195   Cardiac Enzymes:  Recent Labs Lab 07/31/13 1353 08/01/13 0413  CKTOTAL 61443* 6238*  TROPONINI <0.30  --    BNP (last 3 results) No results found for this basename: PROBNP,  in the last 8760 hours CBG:  Recent Labs Lab 07/31/13 1144  GLUCAP 83    No results found for this or any previous visit (from the past 240  hour(s)).   Studies:  Recent x-ray studies have been reviewed in detail by the Attending Physician  Scheduled Meds:  Scheduled Meds: . cefTRIAXone (ROCEPHIN)  IV  1 g Intravenous Q24H  . enoxaparin (LOVENOX) injection  40 mg Subcutaneous Q24H  . potassium chloride  40 mEq Oral Q4H   Continuous Infusions: .  sodium bicarbonate  infusion 1000 mL 125 mL/hr at 08/01/13 1139    Time spent on care of this patient: 83 min   Anchor, MD 08/01/2013, 3:07 PM  LOS: 1 day   [facility] Office  [phone] Pager - Text Page per Shea Evans   If 7PM-7AM, please contact night-coverage [email or web address]

## 2013-08-01 NOTE — Progress Notes (Signed)
Pt's valuables locked up w/ security, purse locked up on [address].

## 2013-08-02 DIAGNOSIS — M549 Dorsalgia, unspecified: Secondary | ICD-10-CM

## 2013-08-02 LAB — URINE CULTURE

## 2013-08-02 LAB — MAGNESIUM: Magnesium: 1.9 mg/dL (ref 1.5–2.5)

## 2013-08-02 LAB — CK: CK TOTAL: 2986 U/L — AB (ref 7–177)

## 2013-08-02 MED ORDER — DICLOFENAC SODIUM 1 % TD GEL
4.0000 g | Freq: Four times a day (QID) | TRANSDERMAL | Status: DC
  Administered 2013-08-02 – 2013-08-05 (×7): 4 g via TOPICAL
  Filled 2013-08-02: qty 100

## 2013-08-02 MED ORDER — FOSFOMYCIN TROMETHAMINE 3 G PO PACK
3.0000 g | PACK | Freq: Once | ORAL | Status: AC
  Administered 2013-08-02: 3 g via ORAL
  Filled 2013-08-02: qty 3

## 2013-08-02 MED ORDER — POTASSIUM CHLORIDE CRYS ER 20 MEQ PO TBCR
40.0000 meq | EXTENDED_RELEASE_TABLET | ORAL | Status: AC
  Administered 2013-08-02 (×2): 40 meq via ORAL
  Filled 2013-08-02 (×4): qty 2

## 2013-08-02 NOTE — Progress Notes (Addendum)
[facility] Progress Note   April Stephenson [id number]:[id number] DOB: 15-Jan-1961 DOA: 07/31/2013 PCP: Elsie Stain, MD  Brief narrative: April Stephenson is a 53 y.o. female presenting on 07/31/2013 found confused at 10 PM the night. Because this has happened before, the daughter April Stephenson) thought she would sleep it off but she was still very confused still this AM. The Daughter tried to lay her on the couch multiple times but she kept falling on the floor and eventually slept sitting up on the floor overnight. She has in the past often mixed medications with alcohol. She is ETOH negative now. Because she was still confused this morning, the daughter decided to bring her into the hospital. Here she is found to have ARF and rhabdomyolysis and possibly a UTI. 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 Daughter state she was missing work days.   Subjective: Seems less confused than yesterday- her sister agrees  Assessment/Plan: Principal Problem:   Encephalopathy acute - possible medication overdose/ possibly due to UTI which has not been adequately treated (see below) - ammonia normal - low normal B 12- can be the cause- will replace - - watch on telemetry in case Flexeril OD  Active Problems:   Depression- suicide intentions - holding home meds for now due to suspicion of an overdose - except Valium- cut back on dose to prevent withdrawal- give low dose daily at bedtime - psych consult  - sitter at bedside  UTI -- Enterococcus - Rocephin inadequate to treat this- will start Ampicillin today    Rhabdomyolysis - due to spending the night on the floor- cont to hydrate- improving daily    ARF (acute renal failure) - improved with hydration- either prerenal or due to rhabdo - stopped bicarb infusion and switched to D5 NS    Code Status: Full code Family Communication: with daughter on admission Disposition Plan: to be determined- Pt recommends  HHPT  Consultants: none  Procedures: none  Antibiotics: Anti-infectives   Start     Dose/Rate Route Frequency Ordered Stop   07/31/13 1700  cefTRIAXone (ROCEPHIN) 1 g in dextrose 5 % 50 mL IVPB     1 g 100 mL/hr over 30 Minutes Intravenous Every 24 hours 07/31/13 1646     07/31/13 1515  cefTRIAXone (ROCEPHIN) 1 g in dextrose 5 % 50 mL IVPB     1 g 100 mL/hr over 30 Minutes Intravenous  Once 07/31/13 1507 07/31/13 1619       DVT prophylaxis: Lovenox  Objective: Filed Weights   07/31/13 1730 07/31/13 1839 08/01/13 2124  Weight: 77.111 kg (170 lb) 78 kg (171 lb 15.3 oz) 82.6 kg (182 lb 1.6 oz)    Vitals Filed Vitals:   08/01/13 1330 08/01/13 2124 08/02/13 0522 08/02/13 1427  BP: 128/83 138/91 138/89 138/87  Pulse: 94 90 90 99  Temp: 97.7 F (36.5 C) 98 F (36.7 C) 97.7 F (36.5 C) 98.1 F (36.7 C)  TempSrc: Oral Oral Oral Oral  Resp: 18 16 18 18   Height:  5\' 7"  (1.702 m)    Weight:  82.6 kg (182 lb 1.6 oz)    SpO2: 97% 97% 96% 100%      Intake/Output Summary (Last 24 hours) at 08/02/13 1532 Last data filed at 08/02/13 1406  Gross per 24 hour  Intake 3879.59 ml  Output   4750 ml  Net -870.41 ml     Exam: General: No acute respiratory distress- confused Lungs: Clear  to auscultation bilaterally without wheezes or crackles Cardiovascular: Regular rate and rhythm without murmur gallop or rub normal S1 and S2 Abdomen: Nontender, nondistended, soft, bowel sounds positive, no rebound, no ascites, no appreciable mass BACK: mild tenderness in mid back Extremities: No significant cyanosis, clubbing, or edema bilateral lower extremities  Data Reviewed: Basic Metabolic Panel:  Recent Labs Lab 07/31/13 1353 08/01/13 0413 08/02/13 0500  NA 146 145  --   K 4.0 3.2*  --   CL 107 105  --   CO2 23 27  --   GLUCOSE 90 142*  --   BUN 30* 18  --   CREATININE 1.65* 0.82  --   CALCIUM 9.6 8.4  --   MG  --   --  1.9   Liver Function Tests:  Recent Labs Lab  07/31/13 1353 08/01/13 0413  AST 84* 49*  ALT 53* 42*  ALKPHOS 120* 91  BILITOT 0.6 0.5  PROT 7.8 6.2  ALBUMIN 4.0 2.8*   No results found for this basename: LIPASE, AMYLASE,  in the last 168 hours  Recent Labs Lab 07/31/13 1722  AMMONIA 12   CBC:  Recent Labs Lab 07/31/13 1353 08/01/13 0413  WBC 15.9* 9.2  HGB 13.4 12.4  HCT 40.5 37.2  MCV 95.3 95.6  PLT 242 195   Cardiac Enzymes:  Recent Labs Lab 07/31/13 1353 08/01/13 0413 08/02/13 0500  CKTOTAL 01093* 6238* 2986*  TROPONINI <0.30  --   --    BNP (last 3 results) No results found for this basename: PROBNP,  in the last 8760 hours CBG:  Recent Labs Lab 07/31/13 1144  GLUCAP 83    Recent Results (from the past 240 hour(s))  URINE CULTURE     Status: None   Collection Time    07/31/13  3:03 PM      Result Value Ref Range Status   Specimen Description URINE, CATHETERIZED   Final   Special Requests NONE   Final   Culture  Setup Time     Final   Value: 07/31/2013 22:29     Performed at [facility]     Final   Value: >=100,000 COLONIES/ML     Performed at [facility]   Culture     Final   Value: ENTEROCOCCUS SPECIES     Performed at [facility]   Report Status PENDING   Incomplete     Studies:  Recent x-ray studies have been reviewed in detail by the Attending Physician  Scheduled Meds:  Scheduled Meds: . cefTRIAXone (ROCEPHIN)  IV  1 g Intravenous Q24H  . cyanocobalamin  1,000 mcg Subcutaneous Daily  . diazepam  2 mg Oral QHS  . enoxaparin (LOVENOX) injection  40 mg Subcutaneous Q24H   Continuous Infusions: . dextrose 5 % and 0.9% NaCl 100 mL/hr at 08/02/13 1531    Time spent on care of this patient: 35 min   [place], MD 08/02/2013, 3:32 PM  LOS: 2 days   [facility] Office  [phone] Pager - Text Page per Shea Evans   If 7PM-7AM, please contact night-coverage [email or web address]

## 2013-08-02 NOTE — Evaluation (Signed)
Physical Therapy Evaluation Patient Details Name: April Stephenson MRN: [id number] DOB: [birth year] Today's Date: 08/02/2013   History of Present Illness   Pt admitted 07/31/13 for confusion and multiple falls  Clinical Impression  Pt with decreased balance, gait and safety awareness, as well as cognitive deficits.  Will benefit from skilled acute PTservices to increase functional independence.    Follow Up Recommendations Home health PT;Supervision/Assistance - 24 hour    Equipment Recommendations  Rolling walker with 5" wheels    Recommendations for Other Services       Precautions / Restrictions Precautions Precaution Comments: Sitter, suicide precautions Restrictions Weight Bearing Restrictions: No      Mobility  Bed Mobility Overal bed mobility: Modified Independent                Transfers Overall transfer level: Needs assistance   Transfers: Sit to/from Stand Sit to Stand: Min assist         General transfer comment: pt reports intermittent dizziness, causing the room to spin, requires min A for balance  Ambulation/Gait Ambulation/Gait assistance: Min assist Ambulation Distance (Feet): 100 Feet Assistive device: None;Rolling walker (2 wheeled)     Gait velocity interpretation: Below normal speed for age/gender General Gait Details: initiallygait with min A without AD with several LOB due to c/o dizziness, gait improved with RW to CGA, pt able to gait100' before requiring seated rest break due to R LE pain  Stairs            Wheelchair Mobility    Modified Rankin (Stroke Patients Only)       Balance                                             Pertinent Vitals/Pain Pt c/o pain in R LE and back with mobility, RN made aware    Home Living Family/patient expects to be discharged to:: Private residence Living Arrangements: Children Available Help at Discharge: Family Type of Home: House Home Access: Level entry     Home Layout: One level Home Equipment: None      Prior Function Level of Independence: Independent               Hand Dominance        Extremity/Trunk Assessment   Upper Extremity Assessment: Overall WFL for tasks assessed           Lower Extremity Assessment: Overall WFL for tasks assessed      Cervical / Trunk Assessment: Normal  Communication   Communication: No difficulties  Cognition Arousal/Alertness: Awake/alert Behavior During Therapy:  (labile) Overall Cognitive Status: No family/caregiver present to determine baseline cognitive functioning                      General Comments      Exercises        Assessment/Plan    PT Assessment Patient needs continued PT services  PT Diagnosis Generalized weakness;Acute pain;Difficulty walking   PT Problem List Decreased strength;Decreased activity tolerance;Decreased balance;Decreased mobility;Decreased cognition;Decreased safety awareness;Decreased knowledge of use of DME;Pain  PT Treatment Interventions DME instruction;Gait training;Stair training;Functional mobility training;Therapeutic activities;Therapeutic exercise;Balance training;Cognitive remediation;Patient/family education;Modalities   PT Goals (Current goals can be found in the Care Plan section) Acute Rehab PT Goals Patient Stated Goal: none stated PT Goal Formulation: With patient Time For Goal Achievement: 08/09/13 Potential to Achieve  Goals: Good    Frequency Min 3X/week   Barriers to discharge Decreased caregiver support      Co-evaluation               End of Session   Activity Tolerance: Patient limited by pain Patient left: in bed;with call bell/phone within reach;with nursing/sitter in room Nurse Communication: Mobility status         Time: 0840-0900 PT Time Calculation (min): 20 min   Charges:   PT Evaluation $Initial PT Evaluation Tier I: 1 Procedure PT Treatments $Gait Training: 8-22 mins   PT G  Codes:          Vinia Jemmott 08/02/2013, 9:08 AM

## 2013-08-03 DIAGNOSIS — F1010 Alcohol abuse, uncomplicated: Secondary | ICD-10-CM

## 2013-08-03 DIAGNOSIS — F329 Major depressive disorder, single episode, unspecified: Secondary | ICD-10-CM

## 2013-08-03 DIAGNOSIS — F3289 Other specified depressive episodes: Secondary | ICD-10-CM

## 2013-08-03 DIAGNOSIS — F332 Major depressive disorder, recurrent severe without psychotic features: Secondary | ICD-10-CM

## 2013-08-03 LAB — CK: Total CK: 1466 U/L — ABNORMAL HIGH (ref 7–177)

## 2013-08-03 MED ORDER — ACETAMINOPHEN 325 MG PO TABS
650.0000 mg | ORAL_TABLET | ORAL | Status: DC | PRN
  Administered 2013-08-03 – 2013-08-04 (×3): 650 mg via ORAL
  Filled 2013-08-03 (×3): qty 2

## 2013-08-03 MED ORDER — SERTRALINE HCL 100 MG PO TABS: 100.0000 mg | ORAL_TABLET | Freq: Every day | ORAL | Status: DC

## 2013-08-03 MED ORDER — SERTRALINE HCL 100 MG PO TABS
100.0000 mg | ORAL_TABLET | Freq: Every day | ORAL | Status: DC
  Administered 2013-08-03 – 2013-08-05 (×3): 100 mg via ORAL
  Filled 2013-08-03 (×3): qty 1

## 2013-08-03 MED ORDER — ARIPIPRAZOLE 5 MG PO TABS: 2.5000 mg | ORAL_TABLET | Freq: Every day | ORAL | Status: DC

## 2013-08-03 MED ORDER — ARIPIPRAZOLE 2 MG PO TABS
2.0000 mg | ORAL_TABLET | Freq: Every day | ORAL | Status: DC
  Filled 2013-08-03: qty 1

## 2013-08-03 MED ORDER — ARIPIPRAZOLE 5 MG PO TABS
2.5000 mg | ORAL_TABLET | Freq: Every day | ORAL | Status: DC
  Administered 2013-08-04 – 2013-08-05 (×2): 2.5 mg via ORAL
  Filled 2013-08-03 (×3): qty 1

## 2013-08-03 MED ORDER — CYCLOBENZAPRINE HCL 10 MG PO TABS
10.0000 mg | ORAL_TABLET | Freq: Two times a day (BID) | ORAL | Status: DC | PRN
  Filled 2013-08-03: qty 1

## 2013-08-03 NOTE — Progress Notes (Signed)
Pt c/o headache. No pain medication order. MD text paged. Awaiting return call. Will continue to monitor.

## 2013-08-03 NOTE — Progress Notes (Signed)
Pt requested that her daughter Joyce Heitman take all her valuables/belonings home. Daughter given pt purse and the valuables envelope from security. Valuables/belongings were check with writer,sitter and pt daughter. All valuables and belonging accounted for.

## 2013-08-03 NOTE — Progress Notes (Signed)
Order placed in Epic for Tylenol by Dr. Wynelle Cleveland. Will give once pharmacy verifies medication.

## 2013-08-03 NOTE — Progress Notes (Addendum)
[facility] Progress Note   April Stephenson GEX:[id number] DOB: [birth year] DOA: 07/31/2013 PCP: Elsie Stain, MD  H&P: April Stephenson is a 53 y.o. female presenting on 07/31/2013 found confused at 10 PM the night. Because this has happened before, the daughter Mateo Flow) thought she would sleep it off but she was still very confused still this AM. The Daughter tried to lay her on the couch multiple times but she kept falling on the floor and eventually slept sitting up on the floor overnight. She has in the past often mixed medications with alcohol. She is ETOH negative now. Because she was still confused this morning, the daughter decided to bring her into the hospital. Here she is found to have ARF and rhabdomyolysis and possibly a UTI. 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 Daughter state she was missing work days.   Subjective: Confusion appears resolved- she tells me that she usually take about 3 flexeril a day even though it is prescribed at [facility] as it is the only thing that will control her pain. She thinks she took 3 extra on the day she became confused. In regards to suicidal intentions, she states that she would never tell me the truth if she felt suicidal. She states that currently she has no intention of committing suicide and later states she wishes she could die. She is upset that her daughter with Down syndrome went to live in a facility because her check paid the bills. She is upset because she has been switched to the night shift (postal worked) and she cannot tolerate the night shift.   Assessment/Plan: Principal Problem:   Encephalopathy acute - possible medication overdose/ possibly due to UTI which was adequately treated yesterday (see below) - mentally clear today- quite depressed - ammonia normal - low normal B 12- can be the cause- replacing -can d/c tele now  Active Problems:   Depression- suicide intentions - holding home meds for now  due to suspicion of an overdose - except Valium- cut back on dose to prevent withdrawal- give low dose daily at bedtime - psych consulted- per psych, she is not suicidal and can return to her usual psychiatrist - d/c sitter  UTI -- Enterococcus - Rocephin was being given prior to sensitivities inadequate to treat this- given Fosfomycin yesterday -treatment completed    Rhabdomyolysis - due to spending the night on the floor- cont to hydrate- improving daily    ARF (acute renal failure) - improved with hydration- either prerenal or due to rhabdo - stopped bicarb infusion and switched to D5 NS    Code Status: Full code Family Communication: with daughter on admission and sister yesterday Disposition Plan: to be determined- Pt recommends HHPT  Consultants: none  Procedures: none  Antibiotics: Anti-infectives   Start     Dose/Rate Route Frequency Ordered Stop   07/31/13 1700  cefTRIAXone (ROCEPHIN) 1 g in dextrose 5 % 50 mL IVPB  Status:  Discontinued     1 g 100 mL/hr over 30 Minutes Intravenous Every 24 hours 07/31/13 1646 08/02/13 1704   07/31/13 1515  cefTRIAXone (ROCEPHIN) 1 g in dextrose 5 % 50 mL IVPB     1 g 100 mL/hr over 30 Minutes Intravenous  Once 07/31/13 1507 07/31/13 1619       DVT prophylaxis: Lovenox  Objective: Filed Weights   07/31/13 1730 07/31/13 1839 08/01/13 2124  Weight: 77.111 kg (170 lb) 78 kg (171 lb 15.3 oz)  82.6 kg (182 lb 1.6 oz)    Vitals Filed Vitals:   08/02/13 0522 08/02/13 1427 08/02/13 2047 08/03/13 0516  BP: 138/89 138/87 119/90 139/86  Pulse: 90 99 103 97  Temp: 97.7 F (36.5 C) 98.1 F (36.7 C) 97.8 F (36.6 C) 97.4 F (36.3 C)  TempSrc: Oral Oral Oral Oral  Resp: 18 18 18 18   Height:      Weight:      SpO2: 96% 100% 96% 96%      Intake/Output Summary (Last 24 hours) at 08/03/13 1134 Last data filed at 08/03/13 1108  Gross per 24 hour  Intake   3400 ml  Output   3850 ml  Net   -450 ml     Exam: General: No  acute respiratory distress- confused Lungs: Clear to auscultation bilaterally without wheezes or crackles Cardiovascular: Regular rate and rhythm without murmur gallop or rub normal S1 and S2 Abdomen: Nontender, nondistended, soft, bowel sounds positive, no rebound, no ascites, no appreciable mass BACK: mild tenderness in mid back Extremities: No significant cyanosis, clubbing, or edema bilateral lower extremities  Data Reviewed: Basic Metabolic Panel:  Recent Labs Lab 07/31/13 1353 08/01/13 0413 08/02/13 0500  NA 146 145  --   K 4.0 3.2*  --   CL 107 105  --   CO2 23 27  --   GLUCOSE 90 142*  --   BUN 30* 18  --   CREATININE 1.65* 0.82  --   CALCIUM 9.6 8.4  --   MG  --   --  1.9   Liver Function Tests:  Recent Labs Lab 07/31/13 1353 08/01/13 0413  AST 84* 49*  ALT 53* 42*  ALKPHOS 120* 91  BILITOT 0.6 0.5  PROT 7.8 6.2  ALBUMIN 4.0 2.8*   No results found for this basename: LIPASE, AMYLASE,  in the last 168 hours  Recent Labs Lab 07/31/13 1722  AMMONIA 12   CBC:  Recent Labs Lab 07/31/13 1353 08/01/13 0413  WBC 15.9* 9.2  HGB 13.4 12.4  HCT 40.5 37.2  MCV 95.3 95.6  PLT 242 195   Cardiac Enzymes:  Recent Labs Lab 07/31/13 1353 08/01/13 0413 08/02/13 0500 08/03/13 0452  CKTOTAL 82505* 6238* 2986* 1466*  TROPONINI <0.30  --   --   --    BNP (last 3 results) No results found for this basename: PROBNP,  in the last 8760 hours CBG:  Recent Labs Lab 07/31/13 1144  GLUCAP 83    Recent Results (from the past 240 hour(s))  URINE CULTURE     Status: None   Collection Time    07/31/13  3:03 PM      Result Value Ref Range Status   Specimen Description URINE, CATHETERIZED   Final   Special Requests NONE   Final   Culture  Setup Time     Final   Value: 07/31/2013 22:29     Performed at [place] [facility]     Final   Value: >=100,000 COLONIES/ML     Performed at [facility]   Culture     Final   Value:  ENTEROCOCCUS SPECIES     Performed at [facility]   Report Status 08/02/2013 FINAL   Final   Organism ID, Bacteria ENTEROCOCCUS SPECIES   Final     Studies:  Recent x-ray studies have been reviewed in detail by the Attending Physician  Scheduled Meds:  Scheduled Meds: . cyanocobalamin  1,000 mcg Subcutaneous Daily  . diazepam  2 mg Oral QHS  . diclofenac sodium  4 g Topical QID  . enoxaparin (LOVENOX) injection  40 mg Subcutaneous Q24H   Continuous Infusions: . dextrose 5 % and 0.9% NaCl 100 mL/hr at 08/02/13 1531    Time spent on care of this patient: 35 min   [place], MD 08/03/2013, 11:34 AM  LOS: 3 days   [facility] Office  [phone] Pager - Text Page per Shea Evans   If 7PM-7AM, please contact night-coverage [email or web address]

## 2013-08-03 NOTE — Consult Note (Signed)
[place] [facility] Consult   Reason for Consult:  Depression and insomnia Referring Physician:  Dr. Debbe Odea is an 53 y.o. female. Total Time spent with patient: 45 minutes  Assessment: AXIS I:  Alcohol Abuse and Major Depression, Recurrent severe AXIS II:  Deferred AXIS III:   Past Medical History  Diagnosis Date  . Migraine with aura   . Cancer     cervical cancer  . Depression     per Dr. Silvio Pate with psych, h/o suicide attempt at 53 y/o  . Arthritis     with longstanding foot and back pain after injuries, followed by Dr. Sharol Given  . Impingement syndrome of left shoulder 06/16/2011  . Suicidal overdose    AXIS IV:  occupational problems, other psychosocial or environmental problems, problems related to social environment and problems with primary support group AXIS V:  51-60 moderate symptoms  Plan:  No evidence of imminent risk to self or others at present.   Patient does not meet criteria for psychiatric inpatient admission. Supportive therapy provided about ongoing stressors. Restart home medication Abilify 2 mg PO Qhs and Sertraline 100 mg PO QD, case disucssed with Dr. Wynelle Cleveland Appreciate psychiatric consultation Please contact [phone] if needs further assistance  Subjective:   April Stephenson is a 53 y.o. female patient admitted with depression.  HPI:  Patient is known to this provider from her previous office with her daughter. Patient has been depressed, weak, confused and taking extra muscle relaxant for chronic pack pain and migraine headaches. She was more confused and sedated at home and which is slowly resolving since admission. Patient friend is at bed side and did not contribute to the history. Patient denied current symptoms of depression, anxiety and suicidal or homicidal ideations. She endorses being stressed about working night shifts at [employer] and wishes to change to day time with the recommendation from her primary psychiatrist Dr. Jordan Hawks at  [facility] due to unable to work well with her depression and chronic pain and unable to sleep at night etc. She has no previous history of suicidal attempt or acute psychiatric hospitalizations. Patient daughter is concern about he safety. Patient has intact congnition during my visit.   HPI Elements:   Location:  depression and sleep disturbance. Quality:  poor. Severity:  moderate. Timing:  shift change at work and taking additional muslce relaxant.  Past Psychiatric History: Past Medical History  Diagnosis Date  . Migraine with aura   . Cancer     cervical cancer  . Depression     per Dr. Silvio Pate with psych, h/o suicide attempt at 53 y/o  . Arthritis     with longstanding foot and back pain after injuries, followed by Dr. Sharol Given  . Impingement syndrome of left shoulder 06/16/2011  . Suicidal overdose     reports that she has quit smoking. She has never used smokeless tobacco. She reports that she drinks alcohol. She reports that she does not use illicit drugs. Family History  Problem Relation Age of Onset  . Cancer Mother   . Depression Mother   . Cancer Father   . Heart disease Father      Living Arrangements: Children   Abuse/Neglect [facility]) Physical Abuse: Denies Verbal Abuse: Denies Sexual Abuse: Denies Allergies:   Allergies  Allergen Reactions  . Diclofenac Sodium Other (See Comments)    Nausea Per pt can tolerate toradol IM fine  . Venlafaxine Other (See Comments)    syncope  .  Tramadol Other (See Comments)    Dizzy, doesn't work for patient    ACT Assessment Complete:  NO Objective: Blood pressure 139/86, pulse 97, temperature 97.4 F (36.3 C), temperature source Oral, resp. rate 18, height 5' 7" (1.702 m), weight 82.6 kg (182 lb 1.6 oz), SpO2 96.00%.Body mass index is 28.51 kg/(m^2). Results for orders placed during the hospital encounter of 07/31/13 (from the past 72 hour(s))  CBC     Status: Abnormal   Collection Time    07/31/13  1:53 PM      Result  Value Ref Range   WBC 15.9 (*) 4.0 - 10.5 K/uL   RBC 4.25  3.87 - 5.11 MIL/uL   Hemoglobin 13.4  12.0 - 15.0 g/dL   HCT 40.5  36.0 - 46.0 %   MCV 95.3  78.0 - 100.0 fL   MCH 31.5  26.0 - 34.0 pg   MCHC 33.1  30.0 - 36.0 g/dL   RDW 13.6  11.5 - 15.5 %   Platelets 242  150 - 400 K/uL  COMPREHENSIVE METABOLIC PANEL     Status: Abnormal   Collection Time    07/31/13  1:53 PM      Result Value Ref Range   Sodium 146  137 - 147 mEq/L   Potassium 4.0  3.7 - 5.3 mEq/L   Chloride 107  96 - 112 mEq/L   CO2 23  19 - 32 mEq/L   Glucose, Bld 90  70 - 99 mg/dL   BUN 30 (*) 6 - 23 mg/dL   Creatinine, Ser 1.65 (*) 0.50 - 1.10 mg/dL   Calcium 9.6  8.4 - 10.5 mg/dL   Total Protein 7.8  6.0 - 8.3 g/dL   Albumin 4.0  3.5 - 5.2 g/dL   AST 84 (*) 0 - 37 U/L   ALT 53 (*) 0 - 35 U/L   Alkaline Phosphatase 120 (*) 39 - 117 U/L   Total Bilirubin 0.6  0.3 - 1.2 mg/dL   GFR calc non Af Amer 34 (*) >90 mL/min   GFR calc Af Amer 40 (*) >90 mL/min   Comment: (NOTE)     The eGFR has been calculated using the CKD EPI equation.     This calculation has not been validated in all clinical situations.     eGFR's persistently <90 mL/min signify possible Chronic Kidney     Disease.  ETHANOL     Status: None   Collection Time    07/31/13  1:53 PM      Result Value Ref Range   Alcohol, Ethyl (B) <11  0 - 11 mg/dL   Comment:            LOWEST DETECTABLE LIMIT FOR     SERUM ALCOHOL IS 11 mg/dL     FOR MEDICAL PURPOSES ONLY  ACETAMINOPHEN LEVEL     Status: None   Collection Time    07/31/13  1:53 PM      Result Value Ref Range   Acetaminophen (Tylenol), Serum <15.0  10 - 30 ug/mL   Comment:            THERAPEUTIC CONCENTRATIONS VARY     SIGNIFICANTLY. A RANGE OF 10-30     ug/mL MAY BE AN EFFECTIVE     CONCENTRATION FOR MANY PATIENTS.     HOWEVER, SOME ARE BEST TREATED     AT CONCENTRATIONS OUTSIDE THIS     RANGE.     ACETAMINOPHEN CONCENTRATIONS     >  150 ug/mL AT 4 HOURS AFTER     INGESTION AND >50  ug/mL AT 12     HOURS AFTER INGESTION ARE     OFTEN ASSOCIATED WITH TOXIC     REACTIONS.  SALICYLATE LEVEL     Status: Abnormal   Collection Time    07/31/13  1:53 PM      Result Value Ref Range   Salicylate Lvl <2.9 (*) 2.8 - 20.0 mg/dL  CK     Status: Abnormal   Collection Time    07/31/13  1:53 PM      Result Value Ref Range   [place] 115[address] (*) 7 - 177 U/L  TROPONIN I     Status: None   Collection Time    07/31/13  1:53 PM      Result Value Ref Range   Troponin I <0.30  <0.30 ng/mL   Comment:            Due to the release kinetics of cTnI,     a negative result within the first hours     of the onset of symptoms does not rule out     myocardial infarction with certainty.     If myocardial infarction is still suspected,     repeat the test at appropriate intervals.  URINE CULTURE     Status: None   Collection Time    07/31/13  3:03 PM      Result Value Ref Range   Specimen Description URINE, CATHETERIZED     Special Requests NONE     Culture  Setup Time       Value: 07/31/2013 22:29     Performed at [facility]       Value: >=100,000 COLONIES/ML     Performed at [facility]   Culture       Value: ENTEROCOCCUS SPECIES     Performed at [facility]   Report Status 08/02/2013 FINAL     Organism ID, Bacteria ENTEROCOCCUS SPECIES    AMMONIA     Status: None   Collection Time    07/31/13  5:22 PM      Result Value Ref Range   Ammonia 12  11 - 60 umol/L  VITAMIN B12     Status: None   Collection Time    07/31/13  5:22 PM      Result Value Ref Range   Vitamin B-12 474  211 - 911 pg/mL   Comment: Performed at [facility]  CK     Status: Abnormal   Collection Time    08/01/13  4:13 AM      Result Value Ref Range   Total CK 6238 (*) 7 - 177 U/L  ACETAMINOPHEN LEVEL     Status: None   Collection Time    08/01/13  4:13 AM      Result Value Ref Range   Acetaminophen (Tylenol), Serum <15.0  10 - 30 ug/mL   Comment:             THERAPEUTIC CONCENTRATIONS VARY     SIGNIFICANTLY. A RANGE OF 10-30     ug/mL MAY BE AN EFFECTIVE     CONCENTRATION FOR MANY PATIENTS.     HOWEVER, SOME ARE BEST TREATED     AT CONCENTRATIONS OUTSIDE THIS     RANGE.     ACETAMINOPHEN CONCENTRATIONS     >150 ug/mL AT 4 HOURS AFTER  INGESTION AND >50 ug/mL AT 12     HOURS AFTER INGESTION ARE     OFTEN ASSOCIATED WITH TOXIC     REACTIONS.  PROTIME-INR     Status: None   Collection Time    08/01/13  4:13 AM      Result Value Ref Range   Prothrombin Time 13.7  11.6 - 15.2 seconds   INR 1.07  0.00 - 1.49  COMPREHENSIVE METABOLIC PANEL     Status: Abnormal   Collection Time    08/01/13  4:13 AM      Result Value Ref Range   Sodium 145  137 - 147 mEq/L   Potassium 3.2 (*) 3.7 - 5.3 mEq/L   Comment: DELTA CHECK NOTED   Chloride 105  96 - 112 mEq/L   CO2 27  19 - 32 mEq/L   Glucose, Bld 142 (*) 70 - 99 mg/dL   BUN 18  6 - 23 mg/dL   Comment: DELTA CHECK NOTED   Creatinine, Ser 0.82  0.50 - 1.10 mg/dL   Comment: DELTA CHECK NOTED   Calcium 8.4  8.4 - 10.5 mg/dL   Total Protein 6.2  6.0 - 8.3 g/dL   Albumin 2.8 (*) 3.5 - 5.2 g/dL   AST 49 (*) 0 - 37 U/L   ALT 42 (*) 0 - 35 U/L   Alkaline Phosphatase 91  39 - 117 U/L   Total Bilirubin 0.5  0.3 - 1.2 mg/dL   GFR calc non Af Amer 80 (*) >90 mL/min   GFR calc Af Amer >90  >90 mL/min   Comment: (NOTE)     The eGFR has been calculated using the CKD EPI equation.     This calculation has not been validated in all clinical situations.     eGFR's persistently <90 mL/min signify possible Chronic Kidney     Disease.  CBC     Status: None   Collection Time    08/01/13  4:13 AM      Result Value Ref Range   WBC 9.2  4.0 - 10.5 K/uL   RBC 3.89  3.87 - 5.11 MIL/uL   Hemoglobin 12.4  12.0 - 15.0 g/dL   HCT 37.2  36.0 - 46.0 %   MCV 95.6  78.0 - 100.0 fL   MCH 31.9  26.0 - 34.0 pg   MCHC 33.3  30.0 - 36.0 g/dL   RDW 13.5  11.5 - 15.5 %   Platelets 195  150 - 400 K/uL  CK      Status: Abnormal   Collection Time    08/02/13  5:00 AM      Result Value Ref Range   Total CK 2986 (*) 7 - 177 U/L  MAGNESIUM     Status: None   Collection Time    08/02/13  5:00 AM      Result Value Ref Range   Magnesium 1.9  1.5 - 2.5 mg/dL  CK     Status: Abnormal   Collection Time    08/03/13  4:52 AM      Result Value Ref Range   Total CK 1466 (*) 7 - 177 U/L   Labs are reviewed and are pertinent for as above.  Current Facility-Administered Medications  Medication Dose Route Frequency Provider Last Rate Last Dose  . acetaminophen (TYLENOL) tablet 650 mg  650 mg Oral Q4H PRN Debbe Odea, MD   650 mg at 08/03/13 1214  . alum & mag hydroxide-simeth (MAALOX/MYLANTA)  200-200-20 MG/5ML suspension 30 mL  30 mL Oral Q6H PRN Debbe Odea, MD      . ARIPiprazole (ABILIFY) tablet 2 mg  2 mg Oral QHS Durward Parcel, MD      . cyanocobalamin ((VITAMIN B-12)) injection 1,000 mcg  1,000 mcg Subcutaneous Daily Debbe Odea, MD   1,000 mcg at 08/03/13 0957  . dextrose 5 %-0.9 % sodium chloride infusion   Intravenous Continuous Debbe Odea, MD 100 mL/hr at 08/02/13 1531    . diazepam (VALIUM) tablet 2 mg  2 mg Oral Q6H PRN Debbe Odea, MD   2 mg at 08/03/13 0012  . diazepam (VALIUM) tablet 2 mg  2 mg Oral QHS Debbe Odea, MD   2 mg at 08/02/13 2154  . diclofenac sodium (VOLTAREN) 1 % transdermal gel 4 g  4 g Topical QID Debbe Odea, MD   4 g at 08/02/13 1851  . enoxaparin (LOVENOX) injection 40 mg  40 mg Subcutaneous Q24H Debbe Odea, MD   40 mg at 08/02/13 2153  . polyethylene glycol (MIRALAX / GLYCOLAX) packet 17 g  17 g Oral Daily PRN Debbe Odea, MD      . sertraline (ZOLOFT) tablet 100 mg  100 mg Oral Daily Durward Parcel, MD        Psychiatric Specialty Exam: Physical Exam Full physical performed in Emergency Department. I have reviewed this assessment and concur with its findings.   Review of Systems  Psychiatric/Behavioral: Positive for depression. The  patient is nervous/anxious and has insomnia.   All other systems reviewed and are negative.   Blood pressure 139/86, pulse 97, temperature 97.4 F (36.3 C), temperature source Oral, resp. rate 18, height 5' 7" (1.702 m), weight 82.6 kg (182 lb 1.6 oz), SpO2 96.00%.Body mass index is 28.51 kg/(m^2).  General Appearance: Casual  Eye Contact::  Good  Speech:  Clear and Coherent  Volume:  Decreased  Mood:  Anxious and Depressed  Affect:  Appropriate and Congruent  Thought Process:  Coherent and Goal Directed  Orientation:  Full (Time, Place, and Person)  Thought Content:  WDL  Suicidal Thoughts:  No  Homicidal Thoughts:  No  Memory:  Immediate;   Fair Recent;   Fair  Judgement:  Fair  Insight:  Fair  Psychomotor Activity:  Decreased  Concentration:  Good  Recall:  Good  Fund of Knowledge:Good  Language: Good  Akathisia:  NA  Handed:  Right  AIMS (if indicated):     Assets:  Communication Skills Desire for Improvement Financial Resources/Insurance Housing Intimacy Leisure Time [place] Talents/Skills Transportation  Sleep:      Musculoskeletal: Strength & Muscle Tone: within normal limits Gait & Station: normal Patient leans: N/A  Treatment Plan Summary: Daily contact with patient to assess and evaluate symptoms and progress in treatment Medication management  Daphanie Oquendo,JANARDHAHA R. 08/03/2013 1:44 PM

## 2013-08-04 DIAGNOSIS — E860 Dehydration: Secondary | ICD-10-CM

## 2013-08-04 DIAGNOSIS — G43009 Migraine without aura, not intractable, without status migrainosus: Secondary | ICD-10-CM

## 2013-08-04 DIAGNOSIS — T50901A Poisoning by unspecified drugs, medicaments and biological substances, accidental (unintentional), initial encounter: Secondary | ICD-10-CM

## 2013-08-04 LAB — BASIC METABOLIC PANEL
BUN: 10 mg/dL (ref 6–23)
CALCIUM: 9.2 mg/dL (ref 8.4–10.5)
CHLORIDE: 108 meq/L (ref 96–112)
CO2: 25 mEq/L (ref 19–32)
CREATININE: 0.75 mg/dL (ref 0.50–1.10)
GFR calc non Af Amer: >90 mL/min (ref >90)
Glucose, Bld: 108 mg/dL — ABNORMAL HIGH (ref 70–99)
Potassium: 4 mEq/L (ref 3.7–5.3)
Sodium: 144 mEq/L (ref 137–147)

## 2013-08-04 LAB — CK
CK TOTAL: 835 U/L — AB (ref 7–177)
CK TOTAL: 586 U/L — AB (ref 7–177)

## 2013-08-04 MED ORDER — SODIUM CHLORIDE 0.9 % IV SOLN
INTRAVENOUS | Status: DC
  Administered 2013-08-04 – 2013-08-05 (×4): via INTRAVENOUS

## 2013-08-04 NOTE — Progress Notes (Signed)
[place] TEAM 1 - Stepdown/ICU TEAM Progress Note  KADASIA KASSING UVO:[id number] DOB: 07-16-1960 DOA: 07/31/2013 PCP: Elsie Stain, MD  Admit HPI / Brief Narrative: April Stephenson is a 53 y.o. WF PMHx  depression, migraine, medication misuse. Female found confused at 10 PM last night. Because this has happened before, the daughter Mateo Flow) thought she would sleep it off but she was confused still this AM. The Daughter tried to lay her on the couch multiple times but she kept falling on the floor and eventually slept sitting up on the floor overnight. She has in the past often mixed medications with alcohol. She is ETOH negative now. Because she was still confused this morning, the daughter decided to bring her into the hospital. Here she is found to have ARF and rhabdomyolysis and possibly a UTI. 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 Daughter state she was missing work days.  ROS is unreliable due to confusion.    HPI/Subjective: 6/15 negative CP, SOB, abdominal pain  Assessment/Plan: Encephalopathy acute  - possible medication overdose/ possibly due to UTI which was adequately treated yesterday (see below)  - mentally clear today  Depression - suicide intentions?  - holding home meds for now due to suspicion of an overdose, except Valium -Continue Valium 2 mg  q 6hr PRN  - psych consulted;  per psych, she is not suicidal and can return to her usual psychiatrist   UTI (Enterococcus) - Rocephin was being given prior to sensitivities inadequate to treat this - given Fosfomycin yesterday;treatment completed  Rhabdomyolysis  - CK still high but resolving  -Aggressive hydration overnight DC in a.m. -Trending CK  ARF (acute renal failure)  - Normal saline 239ml/hr  Code Status: FULL Family Communication: no family present at time of exam Disposition Plan: DC in a.m.    Consultants: Dr Durward Parcel.  (psychiatry)   Procedure/Significant Events: 6/11 CT head without contrast;No acute intracranial findings or mass lesion    Culture 6/11 urine positive enterococcus    Antibiotics: Ceftriaxone 6/12>> stopped 6/13 Fosfomycin 6/13   x1 dose    DVT prophylaxis: Lovenox   Devices NA  LINES / TUBES:  6/15 22ga right forearm    Continuous Infusions: . dextrose 5 % and 0.9% NaCl 100 mL/hr at 08/04/13 0022    Objective: VITAL SIGNS: Temp: 97.9 F (36.6 C) (06/15 1434) Temp src: Oral (06/15 1434) BP: 129/89 mmHg (06/15 1434) Pulse Rate: 109 (06/15 1434) SPO2; FIO2:   Intake/Output Summary (Last 24 hours) at 08/04/13 1637 Last data filed at 08/04/13 1300  Gross per 24 hour  Intake   2560 ml  Output   4300 ml  Net  -1740 ml     Exam: General: A./O. x4, NAD, No acute respiratory distress Lungs: Clear to auscultation bilaterally without wheezes or crackles Cardiovascular: Regular rate and rhythm without murmur gallop or rub normal S1 and S2 Abdomen: Nontender, nondistended, soft, bowel sounds positive, no rebound, no ascites, no appreciable mass Extremities: No significant cyanosis, clubbing, or edema bilateral lower extremities  Data Reviewed: Basic Metabolic Panel:  Recent Labs Lab 07/31/13 1353 08/01/13 0413 08/02/13 0500 08/04/13 0430  NA 146 145  --  144  K 4.0 3.2*  --  4.0  CL 107 105  --  108  CO2 23 27  --  25  GLUCOSE 90 142*  --  108*  BUN 30* 18  --  10  CREATININE 1.65* 0.82  --  0.75  CALCIUM 9.6 8.4  --  9.2  MG  --   --  1.9  --    Liver Function Tests:  Recent Labs Lab 07/31/13 1353 08/01/13 0413  AST 84* 49*  ALT 53* 42*  ALKPHOS 120* 91  BILITOT 0.6 0.5  PROT 7.8 6.2  ALBUMIN 4.0 2.8*   No results found for this basename: LIPASE, AMYLASE,  in the last 168 hours  Recent Labs Lab 07/31/13 1722  AMMONIA 12   CBC:  Recent Labs Lab 07/31/13 1353 08/01/13 0413  WBC 15.9* 9.2  HGB 13.4 12.4  HCT 40.5 37.2   MCV 95.3 95.6  PLT 242 195   Cardiac Enzymes:  Recent Labs Lab 07/31/13 1353 08/01/13 0413 08/02/13 0500 08/03/13 0452 08/04/13 0430  CKTOTAL 06237* 6238* 2986* 1466* 36*  TROPONINI <0.30  --   --   --   --    BNP (last 3 results) No results found for this basename: PROBNP,  in the last 8760 hours CBG:  Recent Labs Lab 07/31/13 1144  GLUCAP 83    Recent Results (from the past 240 hour(s))  URINE CULTURE     Status: None   Collection Time    07/31/13  3:03 PM      Result Value Ref Range Status   Specimen Description URINE, CATHETERIZED   Final   Special Requests NONE   Final   Culture  Setup Time     Final   Value: 07/31/2013 22:29     Performed at [facility]     Final   Value: >=100,000 COLONIES/ML     Performed at [facility]   Culture     Final   Value: ENTEROCOCCUS SPECIES     Performed at [facility]   Report Status 08/02/2013 FINAL   Final   Organism ID, Bacteria ENTEROCOCCUS SPECIES   Final     Studies:  Recent x-ray studies have been reviewed in detail by the Attending Physician  Scheduled Meds:  Scheduled Meds: . ARIPiprazole  2.5 mg Oral Daily  . cyanocobalamin  1,000 mcg Subcutaneous Daily  . diazepam  2 mg Oral QHS  . diclofenac sodium  4 g Topical QID  . enoxaparin (LOVENOX) injection  40 mg Subcutaneous Q24H  . sertraline  100 mg Oral Daily    Time spent on care of this patient: 40 mins   Allie Bossier , MD   [facility] Office  [phone] Pager - [phone]  On-Call/Text Page:      [email or web address]      password [email or web address]  If 7PM-7AM, please contact night-coverage [email or web address] Password [facility] 08/04/2013, 4:37 PM   LOS: 4 days

## 2013-08-04 NOTE — Progress Notes (Signed)
Physical Therapy Treatment Patient Details Name: April Stephenson MRN: [id number] DOB: 1960-05-10 Today's Date: 08/04/2013    History of Present Illness Admitted for Acute Encphelopathy    PT Comments    Pt stated "feeling better".  Assisted OOB to amb unit using RW for increased safety and balance.  No c/o dizziness, just "feeling woozy when I get up".  Mild unsteady/drunken gait which improves with use of RW.    Follow Up Recommendations  Home health PT;Supervision/Assistance - 24 hour     Equipment Recommendations  Rolling walker with 5" wheels    Recommendations for Other Services       Precautions / Restrictions Precautions Precautions: Fall    Mobility  Bed Mobility Overal bed mobility: Modified Independent             General bed mobility comments: slow/groggy  Transfers Overall transfer level: Needs assistance Equipment used: None Transfers: Sit to/from Stand Sit to Stand: Min assist         General transfer comment: increased time and <25% VC's on hand placement to increase self assist  Ambulation/Gait Ambulation/Gait assistance: Min assist Ambulation Distance (Feet): 250 Feet Assistive device: Rolling walker (2 wheeled) Gait Pattern/deviations: Step-through pattern;Decreased stride length;Shuffle;Staggering left;Staggering right Gait velocity: decreased   General Gait Details: used RW for increased steadiness and mild c/o "dizziness".  Mild unsteady/drunken gait with c/o R hip/low back pain "spasms that come and go".  Chronic problem.   Stairs            Wheelchair Mobility    Modified Rankin (Stroke Patients Only)       Balance                                    Cognition                            Exercises      General Comments        Pertinent Vitals/Pain C/o L hip/low back "nothing new'    Home Living                      Prior Function            PT Goals (current goals  can now be found in the care plan section) Progress towards PT goals: Progressing toward goals    Frequency  Min 3X/week    PT Plan      Co-evaluation             End of Session Equipment Utilized During Treatment: Gait belt Activity Tolerance: Patient limited by fatigue;Patient limited by pain Patient left: in bed;with call bell/phone within reach     Time: 1430-1445 PT Time Calculation (min): 15 min  Charges:  $Gait Training: 8-22 mins                    G Codes:      Rica Koyanagi  PTA [facility]  Acute  Rehab Pager      [phone]

## 2013-08-04 NOTE — Progress Notes (Signed)
Clinical Social Work Department CLINICAL SOCIAL WORK PSYCHIATRY SERVICE LINE ASSESSMENT 08/04/2013  Patient:  April Stephenson  Account:  [id number]  [place] Date:  07/31/2013  Clinical Social Worker:  Sindy Messing, LCSW  Date/Time:  08/04/2013 02:30 PM Referred by:  Physician  Date referred:  08/04/2013 Reason for Referral  Psychosocial assessment   Presenting Symptoms/Problems (In the person's/family's own words):   Psych consulted due to depression.   Abuse/Neglect/Trauma History (check all that apply)  Denies history   Abuse/Neglect/Trauma Comments:   Psychiatric History (check all that apply)  Outpatient treatment  Inpatient/hospitilization   Psychiatric medications:  Abilify 2.5 mg  Valium 2 mg  Zoloft 100 mg   Current Mental Health Hospitalizations/Previous Mental Health History:   Patient reports she has struggled with depression for several years. Patient had a suicide attempt when she was 53 years old and reports she has received treatment for depression since that time. Patient currently receives medication management and therapy on outpatient basis.   Current provider:   Dr. Landry Corporal   Place and Date:   [facility]   Current Medications:   Scheduled Meds:      . ARIPiprazole  2.5 mg Oral Daily  . cyanocobalamin  1,000 mcg Subcutaneous Daily  . diazepam  2 mg Oral QHS  . diclofenac sodium  4 g Topical QID  . enoxaparin (LOVENOX) injection  40 mg Subcutaneous Q24H  . sertraline  100 mg Oral Daily        Continuous Infusions:      . dextrose 5 % and 0.9% NaCl 100 mL/hr at 08/04/13 0022          PRN Meds:.acetaminophen, alum & mag hydroxide-simeth, cyclobenzaprine, diazepam, polyethylene glycol       Previous Impatient Admission/Date/Reason:   Patient reports one inpatient stay at [facility] when she was 53 years old.   Emotional Health / Current Symptoms    Suicide/Self Harm  Suicide attempt in past (date/description)   Suicide attempt in  the past:   Patient reports one previous suicide attempt when she was 53 years old. Patient denies that she was admitted due to trying to harm herself. Patient denies any SI or HI. Patient reports she would not want to hurt herself because of the stress it would place on her dtrs.   Other harmful behavior:   None reported   Psychotic/Dissociative Symptoms  None reported   Other Psychotic/Dissociative Symptoms:    Attention/Behavioral Symptoms  Within Normal Limits   Other Attention / Behavioral Symptoms:   Patient engaged during assessment.    Cognitive Impairment  Within Normal Limits   Other Cognitive Impairment:   Patient alert and oriented during assessment.    Mood and Adjustment  Flat    Stress, Anxiety, Trauma, Any Recent Loss/Stressor  Other - See comment   Anxiety (frequency):   N/A   Phobia (specify):   N/A   Compulsive behavior (specify):   N/A   Obsessive behavior (specify):   N/A   Other:   Patient reports job is stressful and recent changes with job duties.   Substance Abuse/Use  History of substance use   SBIRT completed (please refer for detailed history):  N  Self-reported substance use:   Patient states that when she was 53 years old she was abusing cocaine because "I was hanging with the wrong crowd." Patient reports she has been sober and denies any current substance use.   Urinary Drug Screen Completed:  Darreld Mclean  Alcohol level:   <11    Environmental/Housing/Living Arrangement  Stable housing   Who is in the home:   Dtr   Emergency contact:  Engineer, building services   Patient's Strengths and Goals (patient's own words):   Patient reports supportive family. Patient reports compliance with outpatient [place] appointments.   Clinical Social Worker's Interpretive Summary:   CSW received referral in order to complete psychosocial assessment. CSW reviewed chart and met with patient at bedside. CSW introduced myself and  explained role.    Patient reports she does not remember coming to the hospital. Patient states that she has been told that her dtr felt she was acting "loopy" and that she needed to come get evaluated. Patient reports that her house is "cluttered" and although she has tried using pill boxes in the past it has not been successful because she misplaces her box. CSW suggested a pill chart instead in order to assist with ensuring she is taking medications as prescribed. Patient denies that this was an attempt to overdose and reports no SI.    Patient reports that she has been dealing with depression her entire life. Patient reports that depression runs in her family and that she did have an attempt when she was 53 years old. Patient married when she was 53 years old and reports it was not a good relationship. Patient started using drugs and attempted to harm herself. Patient was placed in psychiatric hospital after attempt but does not remember details of stay. Patient reports that she is compliant with outpatient appointments and with taking antidepressant medications.    Patient reports that she lives at home with dtr and that things have been going well but that work has been stressful. Patient states that she works for the [employer] and has been there for over 30 years. Patient has been working on day shifts but they are trying to make her work a shift, doing work she does not feel comfortable completing, working on night shift. Patient does not feel this is fair and is talking about issues with boss who is not sympathic to patient's needs. Patient reports that work situation is causing more depression but that she is not feeling SI or HI.    CSW will continue to follow and will assist as needed.   Disposition:  Recommend Psych CSW continuing to support while in hospital   Regino Ramirez, [place] [phone]

## 2013-08-05 LAB — CK
Total CK: 447 U/L — ABNORMAL HIGH (ref 7–177)
Total CK: 372 U/L — ABNORMAL HIGH (ref 7–177)

## 2013-08-05 MED ORDER — DIAZEPAM 2 MG PO TABS: 2.0000 mg | ORAL_TABLET | Freq: Four times a day (QID) | ORAL | Status: DC | PRN

## 2013-08-05 NOTE — Discharge Summary (Signed)
Physician Discharge Summary  April Stephenson WJX:[id number] DOB: 05/20/1960 DOA: 07/31/2013  PCP: Elsie Stain, MD  Admit date: 07/31/2013 Discharge date: 08/05/2013  Time spent: 40 minutes  Recommendations for Outpatient Follow-up:  Encephalopathy acute  - Resolved, A./O. x4  -Followup with PCP  Depression  - psych consulted; per psych, she is not suicidal and can return to her usual psychiatrist   UTI (Enterococcus)  - Rocephin was being given prior to sensitivities inadequate to treat this  - given Fosfomycin yesterday;treatment completed   Rhabdomyolysis  - CK still high but resolving  -Patient instructed to continue to hydrate aggressively at home. Followup with PCP   -Trending CK   ARF (acute renal failure)  - Resolved, creatinine within normal limits     Discharge Diagnoses:  Principal Problem:   Encephalopathy acute Active Problems:   Depression   Migraine without aura   Insomnia   Rhabdomyolysis   ARF (acute renal failure)   Discharge Condition: Stable*  Diet recommendation: Regular  Filed Weights   07/31/13 1730 07/31/13 1839 08/01/13 2124  Weight: 77.111 kg (170 lb) 78 kg (171 lb 15.3 oz) 82.6 kg (182 lb 1.6 oz)    History of present illness:  April Stephenson is a 53 y.o. WF PMHx depression, migraine, medication misuse.  Female found confused at 10 PM last night. Because this has happened before, the daughter Mateo Flow) thought she would sleep it off but she was confused still this AM. The Daughter tried to lay her on the couch multiple times but she kept falling on the floor and eventually slept sitting up on the floor overnight. She has in the past often mixed medications with alcohol. She is ETOH negative now. Because she was still confused this morning, the daughter decided to bring her into the hospital. Here she is found to have ARF and rhabdomyolysis and possibly a UTI. 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 Daughter  state she was missing work days. ROS is unreliable due to confusion. 6/16 patient's acute encephalopathy most likely secondary to medication overdose and rhabdomyolysis has resolved. With aggressive hydration the patient's rhabdomyolysis is resolving. Safe for discharge  Consultants:  Dr Lisette Grinder R. (psychiatry)    Procedure/Significant Events:  6/11 CT head without contrast;No acute intracranial findings or mass lesion    Culture  6/11 urine positive enterococcus    Antibiotics:  Ceftriaxone 6/12>> stopped 6/13  Fosfomycin 6/13 x1 dose    Discharge Exam: Filed Vitals:   08/03/13 2110 08/04/13 0544 08/04/13 1434 08/05/13 0547  BP: 128/66 132/84 129/89 147/91  Pulse: 93 83 109 85  Temp: 98 F (36.7 C) 97.8 F (36.6 C) 97.9 F (36.6 C) 97.6 F (36.4 C)  TempSrc: Oral Oral Oral Oral  Resp: 18 18 22 20   Height:      Weight:      SpO2: 97% 95% 100% 100%    General: A./O. x4, NAD, No acute respiratory distress  Lungs: Clear to auscultation bilaterally without wheezes or crackles  Cardiovascular: Regular rate and rhythm without murmur gallop or rub normal S1 and S2  Abdomen: Nontender, nondistended, soft, bowel sounds positive, no rebound, no ascites, no appreciable mass  Extremities: No significant cyanosis, clubbing, or edema bilateral lower extremities  Discharge Instructions     Medication List    ASK your doctor about these medications       acetaminophen 500 MG tablet  Commonly known as:  TYLENOL  Take 1,000 mg  by mouth 3 (three) times daily as needed (pain).     ARIPiprazole 5 MG tablet  Commonly known as:  ABILIFY  Take 2.5 mg by mouth daily.     aspirin EC 81 MG tablet  Take 81 mg by mouth daily.     cyclobenzaprine 10 MG tablet  Commonly known as:  FLEXERIL  Take 10 mg by mouth 2 (two) times daily as needed for muscle spasms.     diazepam 5 MG tablet  Commonly known as:  VALIUM  Take 5 mg by mouth every 6 (six) hours as needed for  anxiety. For sleep or anxiety     ranitidine 150 MG tablet  Commonly known as:  ZANTAC  Take 150 mg by mouth 2 (two) times daily as needed (acid reflux).     sertraline 100 MG tablet  Commonly known as:  ZOLOFT  Take 100 mg by mouth daily.     SUMAtriptan 50 MG tablet  Commonly known as:  IMITREX  Take 50 mg by mouth every 2 (two) hours as needed for headache. May repeat in 2 hours if headache persists or recurs. Do not exceed 200mg  per day.     topiramate 25 MG tablet  Commonly known as:  TOPAMAX  Take 25-100 mg by mouth at bedtime. 1 tab qhs, may increase by 1 tab per week up to 4 tabs qhs.     traMADol 50 MG tablet  Commonly known as:  ULTRAM  Take 50 mg by mouth 3 (three) times daily as needed (pain).     VITAMIN E PO  Take 500 mg by mouth daily.       Allergies  Allergen Reactions  . Diclofenac Sodium Other (See Comments)    Nausea Per pt can tolerate toradol IM fine  . Venlafaxine Other (See Comments)    syncope  . Tramadol Other (See Comments)    Dizzy, doesn't work for patient      The results of significant diagnostics from this hospitalization (including imaging, microbiology, ancillary and laboratory) are listed below for reference.    Significant Diagnostic Studies: Dg Chest 1 View  07/31/2013   CLINICAL DATA:  Drug overdose  EXAM: CHEST - 1 VIEW  COMPARISON:  None.  FINDINGS: The heart size and mediastinal contours are within normal limits. Both lungs are clear. The visualized skeletal structures are unremarkable.  IMPRESSION: No active disease.   Electronically Signed   By: Franchot Gallo M.D.   On: 07/31/2013 13:14   Ct Head Wo Contrast  07/31/2013   CLINICAL DATA:  Mental status changes.  EXAM: CT HEAD WITHOUT CONTRAST  TECHNIQUE: Contiguous axial images were obtained from the base of the skull through the vertex without intravenous contrast.  COMPARISON:  04/05/2006.  FINDINGS: The ventricles are normal in size and configuration. No extra-axial fluid  collections are identified. The gray-white differentiation is normal. No CT findings for acute intracranial process such as hemorrhage or infarction. No mass lesions. The brainstem and cerebellum are grossly normal.  The bony structures are intact. The paranasal sinuses and mastoid air cells are clear. The globes are intact.  IMPRESSION: No acute intracranial findings or mass lesion.   Electronically Signed   By: Kalman Jewels M.D.   On: 07/31/2013 14:22    Microbiology: Recent Results (from the past 240 hour(s))  URINE CULTURE     Status: None   Collection Time    07/31/13  3:03 PM      Result Value Ref Range  Status   Specimen Description URINE, CATHETERIZED   Final   Special Requests NONE   Final   Culture  Setup Time     Final   Value: 07/31/2013 22:29     Performed at [facility] Count     Final   Value: >=100,000 COLONIES/ML     Performed at [facility]   Culture     Final   Value: ENTEROCOCCUS SPECIES     Performed at [facility]   Report Status 08/02/2013 FINAL   Final   Organism ID, Bacteria ENTEROCOCCUS SPECIES   Final     Labs: Basic Metabolic Panel:  Recent Labs Lab 07/31/13 1353 08/01/13 0413 08/02/13 0500 08/04/13 0430  NA 146 145  --  144  K 4.0 3.2*  --  4.0  CL 107 105  --  108  CO2 23 27  --  25  GLUCOSE 90 142*  --  108*  BUN 30* 18  --  10  CREATININE 1.65* 0.82  --  0.75  CALCIUM 9.6 8.4  --  9.2  MG  --   --  1.9  --    Liver Function Tests:  Recent Labs Lab 07/31/13 1353 08/01/13 0413  AST 84* 49*  ALT 53* 42*  ALKPHOS 120* 91  BILITOT 0.6 0.5  PROT 7.8 6.2  ALBUMIN 4.0 2.8*   No results found for this basename: LIPASE, AMYLASE,  in the last 168 hours  Recent Labs Lab 07/31/13 1722  AMMONIA 12   CBC:  Recent Labs Lab 07/31/13 1353 08/01/13 0413  WBC 15.9* 9.2  HGB 13.4 12.4  HCT 40.5 37.2  MCV 95.3 95.6  PLT 242 195   Cardiac Enzymes:  Recent Labs Lab 07/31/13 1353   08/03/13 0452 08/04/13 0430 08/04/13 1700 08/05/13 0109 08/05/13 0750  CKTOTAL 64680*  < > 1466* 835* 586* 447* 372*  TROPONINI <0.30  --   --   --   --   --   --   < > = values in this interval not displayed. BNP: BNP (last 3 results) No results found for this basename: PROBNP,  in the last 8760 hours CBG:  Recent Labs Lab 07/31/13 [place] 83       Signed:  Dia Crawford, MD [facility] [phone] pager

## 2013-08-05 NOTE — Progress Notes (Signed)
Clinical Social Work Progress Note PSYCHIATRY SERVICE LINE 08/05/2013  Patient:  April Stephenson  Account:  [id number]  [place] Date:  07/31/2013  Clinical Social Worker:  Sindy Messing, LCSW  Date/Time:  08/05/2013 03:00 PM  Review of Patient  Overall Medical Condition:   Patient is medically stable and ready to DC today.   Participation Level:  Active  Participation Quality  Appropriate   Other Participation Quality:   Patient engaged and thanked CSW for visit.   Affect  Appropriate   Cognitive  Appropriate   Reaction to Medications/Concerns:   None reported   Modes of Intervention  Support  Solution-Focused   Summary of Progress/Plan at Discharge   CSW met with patient at bedside. Patient reports she is DC today and her dtr will pick her up after she gets off work. Patient reports she has taken a shower and is feeling much better.    CSW and patient spoke in further detail about her family. Patient has three children. Patient's 74 year old dtr has Down Syndrome and was recently moved into a group home in February. Patient feels guilty about this decision and reports it is difficult to visit dtr because she always feels guilty when leaving her. Patient has a 60 year old son who has Asperger and lives with his father. Patient reports good relationship with 54 year old dtr that lives at home but often feels guilty thinking that she puts too much stress on her.    Patient spoke about work and that she was worried about returning. Patient has upcoming therapy appointment on Friday and reports she will discuss coping skills and how to address boss during session. Patient reports that she understands how important follow up appointments are in the community and agreeable to talk with therapist about further concerns.    Patient and CSW spoke about the importance of taking medications as prescribed. Patient reports she plans to download an App on her phone that will remind her when to  take her medications and will allow her to document each time she has taken her medication. Patient continues to report that overdose was accidental but aware that she needs to be more careful with medications.    Patient shared additional stories about depression running in her family and how she had a sister commit suicide. Patient reports she would never hurt herself because she would not want her children to feel guilty or not have closure. Patient's mother is 65 years old and patient reports that "it would kill her" mom if someone had to tell her that patient had hurt herself. Patient contracts for safety and reports she is ready to DC home.      [place], [place] [phone]

## 2013-08-06 ENCOUNTER — Encounter: Payer: Self-pay | Admitting: Family Medicine

## 2013-08-06 ENCOUNTER — Ambulatory Visit (INDEPENDENT_AMBULATORY_CARE_PROVIDER_SITE_OTHER): Payer: Federal, State, Local not specified - PPO | Admitting: Family Medicine

## 2013-08-06 VITALS — BP 114/80 | HR 110 | Temp 97.9°F | Wt 177.5 lb

## 2013-08-06 DIAGNOSIS — F329 Major depressive disorder, single episode, unspecified: Secondary | ICD-10-CM

## 2013-08-06 DIAGNOSIS — M6282 Rhabdomyolysis: Secondary | ICD-10-CM

## 2013-08-06 DIAGNOSIS — F3289 Other specified depressive episodes: Secondary | ICD-10-CM

## 2013-08-06 DIAGNOSIS — F32A Depression, unspecified: Secondary | ICD-10-CM

## 2013-08-06 NOTE — Progress Notes (Signed)
Pre visit review using our clinic review tool, if applicable. No additional management support is needed unless otherwise documented below in the visit note.  [facility] follow up.  She took tramadol and flexeril with an OD.  Intentional OD.  Admitted, rhabdo noted.  CK trending down.  Records reviewed.   No SI/HI.  Contracts for safety.  "I had time to think in the hospital."  "I don't want to die before my mother."  She has some friends for support.  She has bills to pay, stressors at work.  She still feels more uplifted than prev.  "My daughter needs me."    Etoh: last drink was ~3 weeks ago or longer.  Not smoking.  Some MJ prev.  She has plan for psych f/u next week at the [facility].  She has a follow up with a therapist at the [facility] next week.   No hallucinations.  She will occ forget tasks/conversations.  She had been taking valium prn for anxiety.    PMH and SH reviewed  ROS: See HPI, otherwise noncontributory.  Meds, vitals, and allergies reviewed.   nad ncat Mmm rrr ctab abd soft Ext w/o edema Speech is fluent, slightly slow. Affect is still flat. Judgement appears intact.

## 2013-08-06 NOTE — Patient Instructions (Addendum)
Go to the lab on the way out.  We'll contact you with your lab report. Keep the appointment with the [facility] next week.  If you situation is getting worse in the meantime, then call us or dial 911.   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  I'll need communcation from the [facility] about your eventual return to work.

## 2013-08-07 LAB — COMPREHENSIVE METABOLIC PANEL
ALK PHOS: 96 U/L (ref 39–117)
ALT: 49 U/L — ABNORMAL HIGH (ref 0–35)
AST: 29 U/L (ref 0–37)
Albumin: 4.3 g/dL (ref 3.5–5.2)
BUN: 12 mg/dL (ref 6–23)
CO2: 26 mEq/L (ref 19–32)
CREATININE: 0.9 mg/dL (ref 0.4–1.2)
Calcium: 9.7 mg/dL (ref 8.4–10.5)
Chloride: 105 mEq/L (ref 96–112)
GFR: 67.72 mL/min (ref >60.00)
Glucose, Bld: 113 mg/dL — ABNORMAL HIGH (ref 70–99)
Potassium: 4 mEq/L (ref 3.5–5.1)
Sodium: 141 mEq/L (ref 135–145)
Total Bilirubin: 0.5 mg/dL (ref 0.2–1.2)
Total Protein: 7.7 g/dL (ref 6.0–8.3)

## 2013-08-07 LAB — CK: CK TOTAL: 207 U/L — AB (ref 7–177)

## 2013-08-08 NOTE — Assessment & Plan Note (Signed)
Trending down, see notes on labs. D/w pt about continued fluid intake.

## 2013-08-08 NOTE — Assessment & Plan Note (Signed)
With suicide attempt. Contracts for safety.  No SI/HI.  Has psych f/u.  Not cleared to go back to work yet.  Will work on [employer].  She'll need psych f/u before potentially deciding about return to work date.  Will f/u with counseling.  Has some social support.  Here with a friend today.  Okay for outpatient f/u since she contracts for safety.  >25 minutes spent in face to face time with patient, >50% spent in counselling or coordination of care.

## 2013-08-10 ENCOUNTER — Telehealth: Payer: Self-pay | Admitting: Family Medicine

## 2013-08-10 NOTE — Telephone Encounter (Signed)
Call and get update on patient if she doesn't call back by 08/12/13.  Thanks.

## 2013-08-12 NOTE — Telephone Encounter (Signed)
Called patient and was advised that she feels that she is doing better. Patient stated that she has an appointment today with a psychiatrist and will be back in touch with Dr. Damita Dunnings about releasing her to go back to work.

## 2013-08-12 NOTE — Telephone Encounter (Signed)
Noted, thanks!

## 2013-08-14 ENCOUNTER — Ambulatory Visit (INDEPENDENT_AMBULATORY_CARE_PROVIDER_SITE_OTHER): Payer: Federal, State, Local not specified - PPO | Admitting: Family Medicine

## 2013-08-14 ENCOUNTER — Encounter: Payer: Self-pay | Admitting: Family Medicine

## 2013-08-14 VITALS — BP 122/74 | HR 109 | Temp 98.5°F | Wt 170.5 lb

## 2013-08-14 DIAGNOSIS — M5431 Sciatica, right side: Secondary | ICD-10-CM

## 2013-08-14 DIAGNOSIS — M5489 Other dorsalgia: Secondary | ICD-10-CM

## 2013-08-14 DIAGNOSIS — M549 Dorsalgia, unspecified: Secondary | ICD-10-CM

## 2013-08-14 DIAGNOSIS — F32A Depression, unspecified: Secondary | ICD-10-CM

## 2013-08-14 DIAGNOSIS — F329 Major depressive disorder, single episode, unspecified: Secondary | ICD-10-CM

## 2013-08-14 DIAGNOSIS — M5430 Sciatica, unspecified side: Secondary | ICD-10-CM

## 2013-08-14 DIAGNOSIS — F3289 Other specified depressive episodes: Secondary | ICD-10-CM

## 2013-08-14 NOTE — Patient Instructions (Signed)
Extend time out of work on [employer] to 09/01/13.  Continue with counseling and the medicine changes.   Rosaria Ferries will call about your referral.

## 2013-08-14 NOTE — Progress Notes (Signed)
Pre visit review using our clinic review tool, if applicable. No additional management support is needed unless otherwise documented below in the visit note.  Would be reasonable to get her to pain mgmt per psych recs.  She continues to have pain, mid back down to R foot.  She still has feeling in her feet. Had seen Dr. Wadie Lessen.  [facility].    Mood is similar to prev.  Still sleeping a lot, fatigued.  She still has some hope for improvement.  No SI/HI.  SSRI was increased by psych.  Still in counseling.  She has f/u with psych for 1 month from now.    Meds, vitals, and allergies reviewed.   ROS: See HPI.  Otherwise, noncontributory.  nad ncat Mmm rrr ctab Speech fluent Affect is flat.

## 2013-08-15 NOTE — Assessment & Plan Note (Signed)
Refer to pain clinic? 

## 2013-08-15 NOTE — Assessment & Plan Note (Signed)
No Si/Hi, contracts for safety.  SSRI inc per psych.  Hold out of work for now.  FMLA prev started.  Okay for outpatient fu.  Continue with counseling. She agrees.  Prev labs d/w pt.

## 2013-09-03 ENCOUNTER — Ambulatory Visit (INDEPENDENT_AMBULATORY_CARE_PROVIDER_SITE_OTHER): Payer: Federal, State, Local not specified - PPO | Admitting: Family Medicine

## 2013-09-03 ENCOUNTER — Encounter: Payer: Self-pay | Admitting: Family Medicine

## 2013-09-03 VITALS — BP 118/80 | HR 104 | Temp 98.3°F | Wt 174.2 lb

## 2013-09-03 DIAGNOSIS — F3289 Other specified depressive episodes: Secondary | ICD-10-CM

## 2013-09-03 DIAGNOSIS — F329 Major depressive disorder, single episode, unspecified: Secondary | ICD-10-CM

## 2013-09-03 DIAGNOSIS — F32A Depression, unspecified: Secondary | ICD-10-CM

## 2013-09-03 NOTE — Patient Instructions (Signed)
Out of work until 09/17/13.   Don't change your meds for now.  Glad to see you.  Update me around 09/15/13, sooner if needed.  Take care.

## 2013-09-03 NOTE — Progress Notes (Signed)
Pre visit review using our clinic review tool, if applicable. No additional management support is needed unless otherwise documented below in the visit note.  Last saw psych a few weeks ago, not since the last OV here.  Her SSRI was increased prev.  She has psych f/u at the end of the month.  Still in counseling and "I'm scared not to go to it."  Mood has been "really level" recently, had a friend move in and that has helped with day to day concerns around the house.  Safe at home.  No SI/HI.  No plans for OD again.  She wants to do well in the long run.  Still with episodes of crying, some improvement from prev.  She was referred to pain clinic and has f/u testing tomorrow with f/u appointment later in the month (~09/16/13).  She still has sig lower midline back pain, and to the L side.  She still has cramping and spasms. Less radicular pain now, still happens but not at the moment of the exam.    Meds, vitals, and allergies reviewed.   ROS: See HPI.  Otherwise, noncontributory.  nad She was able to grin a little today during her interview but her affect is still overall flat Speech and judgement appear wnl Neck supple, no LA rrr ctab abd soft.

## 2013-09-04 NOTE — Assessment & Plan Note (Signed)
She is some better, still with some crying episodes. Would continue as is with SSRI and other meds.  See letter re: possible timeline to go back to work on 09/17/13.  She is better but not yet ready to go back to work.  Okay for outpatient f/u in the meantime. She'll update me.  She agrees with plan. No SI/HI.

## 2013-09-10 ENCOUNTER — Other Ambulatory Visit: Payer: Self-pay | Admitting: Pain Medicine

## 2013-09-10 DIAGNOSIS — M5416 Radiculopathy, lumbar region: Secondary | ICD-10-CM

## 2013-09-11 ENCOUNTER — Ambulatory Visit
Admission: RE | Admit: 2013-09-11 | Discharge: 2013-09-11 | Disposition: A | Discharge status: Home / Self Care | Payer: Federal, State, Local not specified - PPO | Source: Ambulatory Visit | Attending: Pain Medicine | Admitting: Pain Medicine

## 2013-09-11 DIAGNOSIS — M5416 Radiculopathy, lumbar region: Secondary | ICD-10-CM

## 2013-11-10 ENCOUNTER — Ambulatory Visit: Payer: Federal, State, Local not specified - PPO | Admitting: Family Medicine

## 2013-11-11 ENCOUNTER — Telehealth: Payer: Self-pay | Admitting: Family Medicine

## 2013-11-11 NOTE — Telephone Encounter (Signed)
Needs f/u.  Thanks.  

## 2013-11-11 NOTE — Telephone Encounter (Signed)
Patient did not come for their scheduled appointment 11/10/13 for a follow up.  Please let me know if the patient needs to be contacted immediately for follow up or if no follow up is necessary.

## 2013-11-12 NOTE — Telephone Encounter (Signed)
Left message asking pt to call office please schedule f/u appointment

## 2013-11-12 NOTE — Telephone Encounter (Signed)
Patient returned call. Patient rescheduled appointment to 11/17/13.

## 2013-11-17 ENCOUNTER — Ambulatory Visit: Payer: Federal, State, Local not specified - PPO | Admitting: Family Medicine

## 2013-11-17 DIAGNOSIS — Z0289 Encounter for other administrative examinations: Secondary | ICD-10-CM

## 2013-11-24 ENCOUNTER — Encounter: Payer: Self-pay | Admitting: Family Medicine

## 2013-11-24 ENCOUNTER — Ambulatory Visit (INDEPENDENT_AMBULATORY_CARE_PROVIDER_SITE_OTHER): Payer: Federal, State, Local not specified - PPO | Admitting: Family Medicine

## 2013-11-24 VITALS — BP 112/72 | HR 100 | Temp 98.4°F | Wt 171.5 lb

## 2013-11-24 DIAGNOSIS — F32A Depression, unspecified: Secondary | ICD-10-CM

## 2013-11-24 DIAGNOSIS — F329 Major depressive disorder, single episode, unspecified: Secondary | ICD-10-CM

## 2013-11-24 DIAGNOSIS — M5489 Other dorsalgia: Secondary | ICD-10-CM

## 2013-11-24 NOTE — Progress Notes (Signed)
Pre visit review using our clinic review tool, if applicable. No additional management support is needed unless otherwise documented below in the visit note.  She had talked to Dr. Vira Blanco with the pain clinic.  She saw Dr. Silvio Pate with the [facility] with psych last week.  She has no SI/HI.    Pain has fluctuated with the weather changes recently.  Going to see Vira Blanco again on 12/04/13.  Pain is much better sitting down, with much more pain standing.    Mood.  She has noted occ searching for names and some fatigue, discussed that both may get better.  Other have noted that she is calm, more than prev.  Less irritable.  She is back at work in the meantime.  She is working nights but she doesn't have light duty available at the moment.  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    She napping in the afternoon, to accommodate for the 3rd shift.  She has support from a boyfriend at home, living with her.  He daughter moved out.    Meds, vitals, and allergies reviewed.   ROS: See HPI.  Otherwise, noncontributory.  GEN: nad, alert and oriented, speech wnl, fluent.  Judgement appears intact HEENT: mucous membranes moist NECK: supple w/o LA CV: rrr.  PULM: ctab, no inc wob

## 2013-11-24 NOTE — Assessment & Plan Note (Signed)
Per pain clinic.  She'll follow up.

## 2013-11-24 NOTE — Patient Instructions (Signed)
Take care.  I'm glad you went back to the [facility].  I'll await Dr. Jodene Nam follow up notes. I would get a flu shot each fall.

## 2013-11-24 NOTE — Assessment & Plan Note (Signed)
Overall improved, no SI/HI.  She has insight into her situation.  Wouldn't change meds at this point.  She has some support at home.  Okay for outpatient f/u.  She'll update me as needed.

## 2014-02-20 DIAGNOSIS — M720 Palmar fascial fibromatosis [Dupuytren]: Secondary | ICD-10-CM

## 2014-02-20 HISTORY — DX: Palmar fascial fibromatosis (dupuytren): M72.0

## 2014-02-25 ENCOUNTER — Encounter: Payer: Self-pay | Admitting: Family Medicine

## 2014-02-25 ENCOUNTER — Ambulatory Visit (INDEPENDENT_AMBULATORY_CARE_PROVIDER_SITE_OTHER): Payer: Federal, State, Local not specified - PPO | Admitting: Family Medicine

## 2014-02-25 VITALS — BP 114/78 | HR 90 | Temp 99.0°F | Wt 173.5 lb

## 2014-02-25 DIAGNOSIS — R251 Tremor, unspecified: Secondary | ICD-10-CM

## 2014-02-25 LAB — CBC WITH DIFFERENTIAL/PLATELET
Basophils Absolute: 0 10*3/uL (ref 0.0–0.1)
Basophils Relative: 0.4 % (ref 0.0–3.0)
Eosinophils Absolute: 0.2 10*3/uL (ref 0.0–0.7)
Eosinophils Relative: 2.2 % (ref 0.0–5.0)
HCT: 40.8 % (ref 36.0–46.0)
HEMOGLOBIN: 13.4 g/dL (ref 12.0–15.0)
LYMPHS ABS: 3 10*3/uL (ref 0.7–4.0)
LYMPHS PCT: 29.9 % (ref 12.0–46.0)
MCHC: 33 g/dL (ref 30.0–36.0)
MCV: 97.3 fl (ref 78.0–100.0)
MONO ABS: 0.6 10*3/uL (ref 0.1–1.0)
Monocytes Relative: 6.1 % (ref 3.0–12.0)
NEUTROS ABS: 6.2 10*3/uL (ref 1.4–7.7)
NEUTROS PCT: 61.4 % (ref 43.0–77.0)
Platelets: 253 10*3/uL (ref 150.0–400.0)
RBC: 4.19 Mil/uL (ref 3.87–5.11)
RDW: 14.4 % (ref 11.5–15.5)
WBC: 10.1 10*3/uL (ref 4.0–10.5)

## 2014-02-25 LAB — COMPREHENSIVE METABOLIC PANEL
ALBUMIN: 4 g/dL (ref 3.5–5.2)
ALT: 12 U/L (ref 0–35)
AST: 7 U/L (ref 0–37)
Alkaline Phosphatase: 93 U/L (ref 39–117)
BUN: 24 mg/dL — ABNORMAL HIGH (ref 6–23)
CALCIUM: 10.1 mg/dL (ref 8.4–10.5)
CO2: 28 meq/L (ref 19–32)
Chloride: 107 mEq/L (ref 96–112)
Creatinine, Ser: 1.1 mg/dL (ref 0.4–1.2)
GFR: 54.42 mL/min — ABNORMAL LOW (ref >60.00)
GLUCOSE: 94 mg/dL (ref 70–99)
Potassium: 3.5 mEq/L (ref 3.5–5.1)
SODIUM: 141 meq/L (ref 135–145)
Total Bilirubin: 0.6 mg/dL (ref 0.2–1.2)
Total Protein: 7.6 g/dL (ref 6.0–8.3)

## 2014-02-25 LAB — TSH: TSH: 0.68 u[IU]/mL (ref 0.35–4.50)

## 2014-02-25 NOTE — Progress Notes (Signed)
Pre visit review using our clinic review tool, if applicable. No additional management support is needed unless otherwise documented below in the visit note.  She has seen the pain clinic. Has gotten an epidural prev and is on hydrocodone per pain clinic (Dr. Jodene [facility]).  She hasn't taken hydrocodone or valium recently, none in the last 2 weeks.  She had been off ability for about 2 months.    She has noted a tremor in her B hand tremor for about 2 weeks.  She can make it stop by resting her hands in her lap.  Noted more with holding a object, writing.   Noted handwriting change recently.   D/w pt about sertraline, that could potentially case a tremor.  She had that increased about 2 months ago, so the timeline may correlate.  No dysphagia.  No neck mass.  No thyroid hx in the patient, but mother with h/o thyroid disease.   Not much caffeine.  No illicits.  Not drinking.    Got married since last OV.  Doing well at home, it's an adjustment but she is happy about the marriage.  Safe at home.    PMH and SH reviewed  ROS: See HPI, otherwise noncontributory.  Meds, vitals, and allergies reviewed.   GEN: nad, alert and oriented HEENT: mucous membranes moist NECK: supple w/o LA, no tmg CV: rrr PULM: ctab, no inc wob ABD: soft, +bs EXT: no edema CN 2-12 wnl B, S/S/DTR wnl x4 B hand tremor noted with arm extension, but no resting tremor. No intention tremor.  No tremor other than the hand tremor.

## 2014-02-25 NOTE — Patient Instructions (Signed)
Check basic labs today.  Go to the lab on the way out.  We'll contact you with your lab report. If labs are normal, then this may be due to the sertraline.   We may need to have you ask psychiatry about a dose decrease or a med change.  Don't change anything yet.  Take care.

## 2014-02-26 ENCOUNTER — Encounter: Payer: Self-pay | Admitting: *Deleted

## 2014-02-26 ENCOUNTER — Encounter: Payer: Self-pay | Admitting: Family Medicine

## 2014-02-26 DIAGNOSIS — R251 Tremor, unspecified: Secondary | ICD-10-CM | POA: Insufficient documentation

## 2014-02-26 NOTE — Assessment & Plan Note (Signed)
Labs unremarkable, reviewed. D/w pt about sertraline, that could potentially case a tremor.  She had that increased about 2 months ago, so the timeline may correlate.  Would have her talk to psych about that med and consider dose change vs med change. Noted that her mood is improved on sertraline.  We can refer to neuro if needed.  D/w pt.  She doesn't have emergent/alarming sx at this point and we can defer that for now. She agrees.  Okay for outpatient f/u.

## 2014-03-12 ENCOUNTER — Observation Stay (HOSPITAL_COMMUNITY)
Admission: EM | Admit: 2014-03-12 | Discharge: 2014-03-14 | Disposition: A | Discharge status: Home / Self Care | Payer: Federal, State, Local not specified - PPO | Attending: General Surgery | Admitting: General Surgery

## 2014-03-12 ENCOUNTER — Emergency Department (HOSPITAL_COMMUNITY): Payer: Federal, State, Local not specified - PPO

## 2014-03-12 ENCOUNTER — Encounter (HOSPITAL_COMMUNITY): Payer: Self-pay | Admitting: Emergency Medicine

## 2014-03-12 DIAGNOSIS — Z79899 Other long term (current) drug therapy: Secondary | ICD-10-CM | POA: Diagnosis not present

## 2014-03-12 DIAGNOSIS — S52502A Unspecified fracture of the lower end of left radius, initial encounter for closed fracture: Principal | ICD-10-CM | POA: Insufficient documentation

## 2014-03-12 DIAGNOSIS — M1990 Unspecified osteoarthritis, unspecified site: Secondary | ICD-10-CM | POA: Insufficient documentation

## 2014-03-12 DIAGNOSIS — Z8541 Personal history of malignant neoplasm of cervix uteri: Secondary | ICD-10-CM | POA: Diagnosis not present

## 2014-03-12 DIAGNOSIS — Y929 Unspecified place or not applicable: Secondary | ICD-10-CM | POA: Insufficient documentation

## 2014-03-12 DIAGNOSIS — S52509A Unspecified fracture of the lower end of unspecified radius, initial encounter for closed fracture: Secondary | ICD-10-CM | POA: Diagnosis present

## 2014-03-12 DIAGNOSIS — G43909 Migraine, unspecified, not intractable, without status migrainosus: Secondary | ICD-10-CM | POA: Insufficient documentation

## 2014-03-12 DIAGNOSIS — Z888 Allergy status to other drugs, medicaments and biological substances status: Secondary | ICD-10-CM | POA: Insufficient documentation

## 2014-03-12 DIAGNOSIS — S62102A Fracture of unspecified carpal bone, left wrist, initial encounter for closed fracture: Secondary | ICD-10-CM | POA: Diagnosis present

## 2014-03-12 DIAGNOSIS — S52501A Unspecified fracture of the lower end of right radius, initial encounter for closed fracture: Secondary | ICD-10-CM | POA: Diagnosis not present

## 2014-03-12 DIAGNOSIS — F329 Major depressive disorder, single episode, unspecified: Secondary | ICD-10-CM | POA: Diagnosis not present

## 2014-03-12 DIAGNOSIS — K219 Gastro-esophageal reflux disease without esophagitis: Secondary | ICD-10-CM | POA: Insufficient documentation

## 2014-03-12 DIAGNOSIS — W1830XA Fall on same level, unspecified, initial encounter: Secondary | ICD-10-CM | POA: Insufficient documentation

## 2014-03-12 DIAGNOSIS — S52609A Unspecified fracture of lower end of unspecified ulna, initial encounter for closed fracture: Secondary | ICD-10-CM | POA: Diagnosis present

## 2014-03-12 DIAGNOSIS — S62101A Fracture of unspecified carpal bone, right wrist, initial encounter for closed fracture: Secondary | ICD-10-CM | POA: Diagnosis present

## 2014-03-12 HISTORY — DX: Gastro-esophageal reflux disease without esophagitis: K21.9

## 2014-03-12 LAB — BASIC METABOLIC PANEL
ANION GAP: 11 (ref 5–15)
BUN: 22 mg/dL (ref 6–23)
CALCIUM: 8.3 mg/dL — AB (ref 8.4–10.5)
CHLORIDE: 103 meq/L (ref 96–112)
CO2: 25 mmol/L (ref 19–32)
CREATININE: 1.24 mg/dL — AB (ref 0.50–1.10)
GFR calc Af Amer: 56 mL/min — ABNORMAL LOW (ref >90)
GFR, EST NON AFRICAN AMERICAN: 48 mL/min — AB (ref >90)
Glucose, Bld: 105 mg/dL — ABNORMAL HIGH (ref 70–99)
POTASSIUM: 4.6 mmol/L (ref 3.5–5.1)
SODIUM: 139 mmol/L (ref 135–145)

## 2014-03-12 LAB — CBC WITH DIFFERENTIAL/PLATELET
BASOS PCT: 0 % (ref 0–1)
Basophils Absolute: 0 10*3/uL (ref 0.0–0.1)
EOS ABS: 0.1 10*3/uL (ref 0.0–0.7)
Eosinophils Relative: 1 % (ref 0–5)
HCT: 36.6 % (ref 36.0–46.0)
HEMOGLOBIN: 12.3 g/dL (ref 12.0–15.0)
Lymphocytes Relative: 29 % (ref 12–46)
Lymphs Abs: 4.6 10*3/uL — ABNORMAL HIGH (ref 0.7–4.0)
MCH: 32.4 pg (ref 26.0–34.0)
MCHC: 33.6 g/dL (ref 30.0–36.0)
MCV: 96.3 fL (ref 78.0–100.0)
Monocytes Absolute: 1.1 10*3/uL — ABNORMAL HIGH (ref 0.1–1.0)
Monocytes Relative: 7 % (ref 3–12)
Neutro Abs: 10.2 10*3/uL — ABNORMAL HIGH (ref 1.7–7.7)
Neutrophils Relative %: 63 % (ref 43–77)
Platelets: 237 10*3/uL (ref 150–400)
RBC: 3.8 MIL/uL — ABNORMAL LOW (ref 3.87–5.11)
RDW: 13.5 % (ref 11.5–15.5)
WBC: 16.1 10*3/uL — ABNORMAL HIGH (ref 4.0–10.5)

## 2014-03-12 MED ORDER — MORPHINE SULFATE 4 MG/ML IJ SOLN
4.0000 mg | Freq: Once | INTRAMUSCULAR | Status: AC
  Administered 2014-03-12: 4 mg via INTRAVENOUS
  Filled 2014-03-12: qty 1

## 2014-03-12 MED ORDER — ONDANSETRON HCL 4 MG/2ML IJ SOLN
4.0000 mg | Freq: Three times a day (TID) | INTRAMUSCULAR | Status: AC | PRN
Start: 2014-03-12 — End: 2014-03-13

## 2014-03-12 MED ORDER — KCL IN DEXTROSE-NACL 20-5-0.45 MEQ/L-%-% IV SOLN
INTRAVENOUS | Status: DC
  Administered 2014-03-13: 01:00:00 via INTRAVENOUS
  Filled 2014-03-12 (×5): qty 1000

## 2014-03-12 MED ORDER — HYDROMORPHONE HCL 1 MG/ML IJ SOLN
1.0000 mg | INTRAMUSCULAR | Status: AC | PRN
  Administered 2014-03-13: 1 mg via INTRAVENOUS
  Filled 2014-03-12 (×2): qty 1

## 2014-03-12 MED ORDER — CEFAZOLIN SODIUM-DEXTROSE 2-3 GM-% IV SOLR
2.0000 g | Freq: Once | INTRAVENOUS | Status: AC
  Administered 2014-03-13: 2 g via INTRAVENOUS
  Filled 2014-03-12: qty 50

## 2014-03-12 MED ORDER — ONDANSETRON HCL 4 MG/2ML IJ SOLN
4.0000 mg | Freq: Once | INTRAMUSCULAR | Status: AC
  Administered 2014-03-12: 4 mg via INTRAVENOUS
  Filled 2014-03-12: qty 2

## 2014-03-12 MED ORDER — HYDROMORPHONE HCL 1 MG/ML IJ SOLN
0.5000 mg | INTRAMUSCULAR | Status: DC | PRN
  Administered 2014-03-13 – 2014-03-14 (×8): 1 mg via INTRAVENOUS
  Filled 2014-03-12 (×8): qty 1

## 2014-03-12 MED ORDER — MORPHINE SULFATE 4 MG/ML IJ SOLN
4.0000 mg | Freq: Once | INTRAMUSCULAR | Status: AC
Start: 2014-03-12 — End: 2014-03-12
  Administered 2014-03-12: 4 mg via INTRAVENOUS
  Filled 2014-03-12: qty 1

## 2014-03-12 NOTE — ED Notes (Signed)
Pt states she fell this morning and tried to catch herself with her hands  Pt is c/o bilateral wrist pain  Pt has deformity noted to her left wrist and redness and swelling noted to her right

## 2014-03-12 NOTE — ED Provider Notes (Addendum)
CSN: [id number]     Arrival date & time 03/12/14  1917 History   First MD Initiated Contact with Patient 03/12/14 1927     Chief Complaint  Patient presents with  . Fall  . Wrist Injury     (Consider location/radiation/quality/duration/timing/severity/associated sxs/prior Treatment) HPI Comments: Patient is a 54 year old female who presents with bilateral wrist pain that started prior to arrival. Patient reports walking through her house and falling, bracing her fall with both of her hands. Patient reports immediate onset of throbbing and severe pain in both wrists without radiation. Movement and palpation makes the pain worse. No alleviating factors. No other injury.    Past Medical History  Diagnosis Date  . Migraine with aura   . Cancer     cervical cancer  . Depression     per Dr. Silvio Pate with psych, h/o suicide attempt at 54 y/o  . Arthritis     with longstanding foot and back pain after injuries, followed by Dr. Sharol Given  . Impingement syndrome of left shoulder 06/16/2011  . Suicidal overdose    Past Surgical History  Procedure Laterality Date  . Tonsillectomy    . Foot surgery      R foot-great toe  . Abdominal hysterectomy  2000    TAH/BSO  . Colonoscopy    . Shoulder surgery      2013   Family History  Problem Relation Age of Onset  . Cancer Mother   . Depression Mother   . Cancer Father   . Heart disease Father    History  Substance Use Topics  . Smoking status: Never Smoker   . Smokeless tobacco: Never Used  . Alcohol Use: 0.0 oz/week    0 Not specified per week     Comment: occassionally   OB History    No data available     Review of Systems  Constitutional: Negative for fever, chills and fatigue.  HENT: Negative for trouble swallowing.   Eyes: Negative for visual disturbance.  Respiratory: Negative for shortness of breath.   Cardiovascular: Negative for chest pain and palpitations.  Gastrointestinal: Negative for nausea, vomiting, abdominal pain and  diarrhea.  Genitourinary: Negative for dysuria and difficulty urinating.  Musculoskeletal: Positive for arthralgias. Negative for neck pain.  Skin: Negative for color change.  Neurological: Negative for dizziness and weakness.  Psychiatric/Behavioral: Negative for dysphoric mood.      Allergies  Diclofenac sodium; Venlafaxine; and Tramadol  Home Medications   Prior to Admission medications   Medication Sig Start Date End Date Taking? Authorizing Provider  acetaminophen (TYLENOL) 500 MG tablet Take 1,000 mg by mouth 3 (three) times daily as needed (pain).    Historical Provider, MD  cyclobenzaprine (FLEXERIL) 10 MG tablet Take 10 mg by mouth 2 (two) times daily as needed for muscle spasms.    Historical Provider, MD  diazepam (VALIUM) 5 MG tablet Take 5 mg by mouth every 6 (six) hours as needed for anxiety.    Historical Provider, MD  HYDROcodone-acetaminophen (NORCO) 10-325 MG per tablet Take 1 tablet by mouth 3 (three) times daily.    Historical Provider, MD  ranitidine (ZANTAC) 150 MG tablet Take 150 mg by mouth 2 (two) times daily as needed (acid reflux).    Historical Provider, MD  sertraline (ZOLOFT) 100 MG tablet Take 200 mg by mouth daily.     Historical Provider, MD  SUMAtriptan (IMITREX) 50 MG tablet Take 50 mg by mouth every 2 (two) hours as needed for  headache. May repeat in 2 hours if headache persists or recurs. Do not exceed 200mg  per day.    Historical Provider, MD  VITAMIN E PO Take 500 mg by mouth daily.    Historical Provider, MD   BP 132/91 mmHg  Pulse 132  Temp(Src) 97.9 F (36.6 C) (Oral)  Resp 18  SpO2 99% Physical Exam  Constitutional: She is oriented to person, place, and time. She appears well-developed and well-nourished. No distress.  HENT:  Head: Normocephalic and atraumatic.  Eyes: Conjunctivae and EOM are normal.  Neck: Normal range of motion.  Cardiovascular: Normal rate, regular rhythm and intact distal pulses.  Exam reveals no gallop and no  friction rub.   No murmur heard. Bilateral radial pulses intact.   Pulmonary/Chest: Effort normal and breath sounds normal. She has no wheezes. She has no rales. She exhibits no tenderness.  Abdominal: Soft. She exhibits no distension. There is no tenderness. There is no rebound.  Musculoskeletal:  Bilateral limited wrist ROM due to deformity with dorsal angulation. Right wrist deformity>left wrist deformity. Patient is able to wiggle fingers. Generalized edema to bilateral wrist and hands.   Neurological: She is alert and oriented to person, place, and time. Coordination normal.  Speech is goal-oriented. Moves limbs without ataxia.   Skin: Skin is warm and dry.  Psychiatric: She has a normal mood and affect. Her behavior is normal.  Nursing note and vitals reviewed.   ED Course  Procedures (including critical care time) Labs Review Labs Reviewed  CBC WITH DIFFERENTIAL - Abnormal; Notable for the following:    WBC 16.1 (*)    RBC 3.80 (*)    Neutro Abs 10.2 (*)    Lymphs Abs 4.6 (*)    Monocytes Absolute 1.1 (*)    All other components within normal limits  BASIC METABOLIC PANEL   SPLINT APPLICATION Date/Time: 01:74 PM Authorized by: Alvina Chou Consent: Verbal consent obtained. Risks and benefits: risks, benefits and alternatives were discussed Consent given by: patient Splint applied by: orthopedic technician Location details: right wrist Splint type: sugar tong Supplies used: orthoglass Post-procedure: The splinted body part was neurovascularly unchanged following the procedure. Patient tolerance: Patient tolerated the procedure well with no immediate complications.   SPLINT APPLICATION Date/Time: 94:49 PM Authorized by: Alvina Chou Consent: Verbal consent obtained. Risks and benefits: risks, benefits and alternatives were discussed Consent given by: patient Splint applied by: orthopedic technician Location details: left wrist Splint type: sugar  tong Supplies used: orthoglass Post-procedure: The splinted body part was neurovascularly unchanged following the procedure. Patient tolerance: Patient tolerated the procedure well with no immediate complications.     Imaging Review Dg Wrist Complete Left  03/12/2014   CLINICAL DATA:  Status post fall this morning with bilateral wrist pain, bruising and deformity. Initial encounter.  EXAM: LEFT WRIST - COMPLETE 3+ VIEW  COMPARISON:  None.  FINDINGS: The patient has a mildly comminuted, dorsally angulated and displaced intra-articular fracture of the distal radius. Ulnar styloid fracture is also seen. There is marked soft tissue swelling about the wrist.  IMPRESSION: Acute distal radius and ulnar styloid fractures.   Electronically Signed   By: Inge Rise M.D.   On: 03/12/2014 19:59   Dg Wrist Complete Right  03/12/2014   CLINICAL DATA:  Status post fall this morning with bilateral wrist pain, bruising in deformity. Initial encounter.  EXAM: RIGHT WRIST - COMPLETE 3+ VIEW  COMPARISON:  None.  FINDINGS: The patient has a distal radius fracture with mild dorsal  angulation. The fracture extends to the articular surface of the distal radius. No other fracture is identified. Soft tissue swelling is seen about the wrist.  IMPRESSION: Acute dorsally angulated distal right radius fracture.   Electronically Signed   By: Inge Rise M.D.   On: 03/12/2014 20:01     EKG Interpretation None      MDM   Final diagnoses:  Wrist fracture, bilateral, closed, initial encounter    7:34 PM Labs and xrays pending. Vitals stable and patient afebrile.   10:01 PM Patient's xrays show bilateral wrist fractures. Patient will be admitted to [facility] to Have surgery by Dr. Lenon Curt tomorrow.     Alvina Chou, PA-C 03/12/14 2205  Dorie Rank, MD 03/12/14 [place], PA-C 03/12/14 [place], MD 03/16/14 [phone]

## 2014-03-12 NOTE — ED Notes (Signed)
Pt. Rings placed in pink cup. Pt. Husband takin rings home.

## 2014-03-12 NOTE — ED Notes (Signed)
Patient transported to X-ray 

## 2014-03-13 ENCOUNTER — Encounter (HOSPITAL_COMMUNITY): Payer: Self-pay | Admitting: *Deleted

## 2014-03-13 ENCOUNTER — Encounter (HOSPITAL_COMMUNITY)
Admission: EM | Disposition: A | Discharge status: Home / Self Care | Payer: Self-pay | Source: Home / Self Care | Attending: Emergency Medicine

## 2014-03-13 ENCOUNTER — Observation Stay (HOSPITAL_COMMUNITY): Payer: Federal, State, Local not specified - PPO | Admitting: Certified Registered Nurse Anesthetist

## 2014-03-13 HISTORY — PX: ORIF RADIAL FRACTURE: SHX5113

## 2014-03-13 LAB — SURGICAL PCR SCREEN
MRSA, PCR: NEGATIVE
Staphylococcus aureus: NEGATIVE

## 2014-03-13 SURGERY — OPEN REDUCTION INTERNAL FIXATION (ORIF) RADIAL FRACTURE
Anesthesia: General | Site: Wrist | Laterality: Bilateral

## 2014-03-13 MED ORDER — MIDAZOLAM HCL 5 MG/5ML IJ SOLN
INTRAMUSCULAR | Status: DC | PRN
  Administered 2014-03-13: 2 mg via INTRAVENOUS

## 2014-03-13 MED ORDER — OXYCODONE HCL 5 MG/5ML PO SOLN
5.0000 mg | Freq: Once | ORAL | Status: AC | PRN
Start: 2014-03-13 — End: 2014-03-13

## 2014-03-13 MED ORDER — MIDAZOLAM HCL 2 MG/2ML IJ SOLN
INTRAMUSCULAR | Status: AC
  Filled 2014-03-13: qty 2

## 2014-03-13 MED ORDER — LIDOCAINE HCL (CARDIAC) 20 MG/ML IV SOLN
INTRAVENOUS | Status: AC
  Filled 2014-03-13: qty 5

## 2014-03-13 MED ORDER — MEPERIDINE HCL 25 MG/ML IJ SOLN: 6.2500 mg | INTRAMUSCULAR | Status: DC | PRN

## 2014-03-13 MED ORDER — HYDROMORPHONE HCL 1 MG/ML IJ SOLN
INTRAMUSCULAR | Status: AC
  Filled 2014-03-13: qty 1

## 2014-03-13 MED ORDER — ROCURONIUM BROMIDE 50 MG/5ML IV SOLN
INTRAVENOUS | Status: AC
  Filled 2014-03-13: qty 1

## 2014-03-13 MED ORDER — OXYCODONE HCL 5 MG PO TABS
ORAL_TABLET | ORAL | Status: AC
Start: 2014-03-13 — End: 2014-03-13
  Administered 2014-03-13: 5 mg via ORAL
  Filled 2014-03-13: qty 1

## 2014-03-13 MED ORDER — ROCURONIUM BROMIDE 100 MG/10ML IV SOLN
INTRAVENOUS | Status: DC | PRN
  Administered 2014-03-13: 40 mg via INTRAVENOUS

## 2014-03-13 MED ORDER — LACTATED RINGERS IV SOLN
INTRAVENOUS | Status: DC | PRN
  Administered 2014-03-13 (×2): via INTRAVENOUS

## 2014-03-13 MED ORDER — ONDANSETRON HCL 4 MG/2ML IJ SOLN
INTRAMUSCULAR | Status: AC
  Filled 2014-03-13: qty 2

## 2014-03-13 MED ORDER — KETOROLAC TROMETHAMINE 30 MG/ML IJ SOLN
INTRAMUSCULAR | Status: AC
  Administered 2014-03-13: 30 mg via INTRAVENOUS
  Filled 2014-03-13: qty 1

## 2014-03-13 MED ORDER — PROPOFOL 10 MG/ML IV BOLUS
INTRAVENOUS | Status: AC
  Filled 2014-03-13: qty 20

## 2014-03-13 MED ORDER — HYDROMORPHONE HCL 1 MG/ML IJ SOLN
0.2500 mg | INTRAMUSCULAR | Status: DC | PRN
  Administered 2014-03-13 (×3): 0.5 mg via INTRAVENOUS

## 2014-03-13 MED ORDER — ACETAMINOPHEN 10 MG/ML IV SOLN
INTRAVENOUS | Status: AC
Start: 2014-03-13 — End: 2014-03-13
  Administered 2014-03-13: 1000 mg via INTRAVENOUS
  Filled 2014-03-13: qty 100

## 2014-03-13 MED ORDER — ONDANSETRON HCL 4 MG/2ML IJ SOLN
INTRAMUSCULAR | Status: DC | PRN
  Administered 2014-03-13: 4 mg via INTRAVENOUS

## 2014-03-13 MED ORDER — GLYCOPYRROLATE 0.2 MG/ML IJ SOLN
INTRAMUSCULAR | Status: DC | PRN
  Administered 2014-03-13: 0.4 mg via INTRAVENOUS

## 2014-03-13 MED ORDER — BUPIVACAINE HCL (PF) 0.25 % IJ SOLN
INTRAMUSCULAR | Status: AC
  Filled 2014-03-13: qty 60

## 2014-03-13 MED ORDER — FENTANYL CITRATE 0.05 MG/ML IJ SOLN
INTRAMUSCULAR | Status: DC | PRN
  Administered 2014-03-13 (×2): 100 ug via INTRAVENOUS
  Administered 2014-03-13: 50 ug via INTRAVENOUS

## 2014-03-13 MED ORDER — 0.9 % SODIUM CHLORIDE (POUR BTL) OPTIME
TOPICAL | Status: DC | PRN
  Administered 2014-03-13: 1000 mL

## 2014-03-13 MED ORDER — OXYCODONE HCL 5 MG PO TABS
5.0000 mg | ORAL_TABLET | Freq: Once | ORAL | Status: AC | PRN
  Administered 2014-03-13: 5 mg via ORAL

## 2014-03-13 MED ORDER — LIDOCAINE HCL (CARDIAC) 20 MG/ML IV SOLN
INTRAVENOUS | Status: DC | PRN
  Administered 2014-03-13: 50 mg via INTRAVENOUS

## 2014-03-13 MED ORDER — KETOROLAC TROMETHAMINE 30 MG/ML IJ SOLN
30.0000 mg | Freq: Once | INTRAMUSCULAR | Status: AC
  Administered 2014-03-13: 30 mg via INTRAVENOUS

## 2014-03-13 MED ORDER — FENTANYL CITRATE 0.05 MG/ML IJ SOLN
INTRAMUSCULAR | Status: AC
  Filled 2014-03-13: qty 5

## 2014-03-13 MED ORDER — PROMETHAZINE HCL 25 MG/ML IJ SOLN: 6.2500 mg | INTRAMUSCULAR | Status: DC | PRN

## 2014-03-13 MED ORDER — BUPIVACAINE HCL 0.25 % IJ SOLN
INTRAMUSCULAR | Status: DC | PRN
  Administered 2014-03-13 (×2): 10 mL

## 2014-03-13 MED ORDER — PROPOFOL 10 MG/ML IV BOLUS
INTRAVENOUS | Status: DC | PRN
  Administered 2014-03-13: 150 mg via INTRAVENOUS

## 2014-03-13 MED ORDER — NEOSTIGMINE METHYLSULFATE 10 MG/10ML IV SOLN
INTRAVENOUS | Status: DC | PRN
  Administered 2014-03-13: 3 mg via INTRAVENOUS

## 2014-03-13 MED ORDER — ACETAMINOPHEN 10 MG/ML IV SOLN
1000.0000 mg | Freq: Four times a day (QID) | INTRAVENOUS | Status: DC
  Administered 2014-03-13: 1000 mg via INTRAVENOUS

## 2014-03-13 MED ORDER — LACTATED RINGERS IV SOLN
INTRAVENOUS | Status: DC
  Administered 2014-03-14: 04:00:00 via INTRAVENOUS

## 2014-03-13 MED ORDER — HYDROMORPHONE HCL 1 MG/ML IJ SOLN
INTRAMUSCULAR | Status: AC
  Administered 2014-03-13: 0.5 mg via INTRAVENOUS
  Filled 2014-03-13: qty 1

## 2014-03-13 SURGICAL SUPPLY — 74 items
APL SKNCLS STERI-STRIP NONHPOA (GAUZE/BANDAGES/DRESSINGS) ×1
BANDAGE ELASTIC 3 VELCRO ST LF (GAUZE/BANDAGES/DRESSINGS) ×1 IMPLANT
BANDAGE ELASTIC 4 VELCRO ST LF (GAUZE/BANDAGES/DRESSINGS) ×2 IMPLANT
BENZOIN TINCTURE PRP APPL 2/3 (GAUZE/BANDAGES/DRESSINGS) ×2 IMPLANT
BIT DRILL 2 FAST STEP (BIT) ×4 IMPLANT
BIT DRILL 2.5X4 QC (BIT) ×4 IMPLANT
BNDG CMPR 9X4 STRL LF SNTH (GAUZE/BANDAGES/DRESSINGS)
BNDG ESMARK 4X9 LF (GAUZE/BANDAGES/DRESSINGS) IMPLANT
BNDG GAUZE ELAST 4 BULKY (GAUZE/BANDAGES/DRESSINGS) ×2 IMPLANT
CANISTER SUCTION 1500CC (MISCELLANEOUS) ×3 IMPLANT
CHLORAPREP W/TINT 26ML (MISCELLANEOUS) ×5 IMPLANT
CLOSURE STERI-STRIP 1/2X4 (GAUZE/BANDAGES/DRESSINGS) ×1
CLSR STERI-STRIP ANTIMIC 1/2X4 (GAUZE/BANDAGES/DRESSINGS) ×1 IMPLANT
CORDS BIPOLAR (ELECTRODE) ×5 IMPLANT
COVER SURGICAL LIGHT HANDLE (MISCELLANEOUS) ×3 IMPLANT
CUFF TOURNIQUET SINGLE 18IN (TOURNIQUET CUFF) ×5 IMPLANT
CUFF TOURNIQUET SINGLE 24IN (TOURNIQUET CUFF) IMPLANT
DRAIN TLS ROUND 10FR (DRAIN) IMPLANT
DRAPE OEC MINIVIEW 54X84 (DRAPES) ×5 IMPLANT
DRAPE SURG 17X23 STRL (DRAPES) ×3 IMPLANT
GAUZE XEROFORM 1X8 LF (GAUZE/BANDAGES/DRESSINGS) ×3 IMPLANT
GLOVE BIO SURGEON STRL SZ 6.5 (GLOVE) ×2 IMPLANT
GLOVE BIO SURGEONS STRL SZ 6.5 (GLOVE) ×2
GLOVE BIOGEL M STRL SZ7.5 (GLOVE) ×3 IMPLANT
GLOVE BIOGEL PI IND STRL 6.5 (GLOVE) IMPLANT
GLOVE BIOGEL PI INDICATOR 6.5 (GLOVE) ×4
GLOVE BIOGEL PI ORTHO PRO SZ7 (GLOVE) ×2
GLOVE PI ORTHO PRO STRL SZ7 (GLOVE) IMPLANT
GLOVE SURG SS PI 6.5 STRL IVOR (GLOVE) ×2 IMPLANT
GLOVE SURG SYN 7.5  E (GLOVE) ×2
GLOVE SURG SYN 7.5 E (GLOVE) ×1 IMPLANT
GLOVE SURG SYN 7.5 PF PI (GLOVE) IMPLANT
GOWN STRL REUS W/ TWL XL LVL3 (GOWN DISPOSABLE) ×2 IMPLANT
GOWN STRL REUS W/TWL MED LVL3 (GOWN DISPOSABLE) ×2 IMPLANT
GOWN STRL REUS W/TWL XL LVL3 (GOWN DISPOSABLE) ×6
KIT BASIN OR (CUSTOM PROCEDURE TRAY) ×3 IMPLANT
MARKER SKIN DUAL TIP RULER LAB (MISCELLANEOUS) ×2 IMPLANT
NEEDLE HYPO 22GX1.5 SAFETY (NEEDLE) IMPLANT
NS IRRIG 1000ML POUR BTL (IV SOLUTION) ×3 IMPLANT
PACK ORTHO EXTREMITY (CUSTOM PROCEDURE TRAY) ×3 IMPLANT
PAD CAST 3X4 CTTN HI CHSV (CAST SUPPLIES) IMPLANT
PAD CAST 4YDX4 CTTN HI CHSV (CAST SUPPLIES) IMPLANT
PADDING CAST ABS 4INX4YD NS (CAST SUPPLIES) ×2
PADDING CAST ABS COTTON 4X4 ST (CAST SUPPLIES) ×1 IMPLANT
PADDING CAST COTTON 3X4 STRL (CAST SUPPLIES) ×3
PADDING CAST COTTON 4X4 STRL (CAST SUPPLIES) ×3
PEG SUBCHONDRAL SMOOTH 2.0X20 (Peg) ×4 IMPLANT
PEG SUBCHONDRAL SMOOTH 2.0X22 (Peg) ×10 IMPLANT
PEG THREADED 2.5MMX16MM LONG (Peg) ×4 IMPLANT
PEG THREADED 2.5MMX18MM LONG (Peg) ×6 IMPLANT
PEG THREADED 2.5MMX20MM LONG (Peg) ×2 IMPLANT
PLATE STAN 24.4X59.5 LT (Plate) ×2 IMPLANT
PLATE STAN 24.4X59.5 RT (Plate) ×2 IMPLANT
SCREW BN 12X3.5XNS CORT TI (Screw) IMPLANT
SCREW CORT 3.5X10 LNG (Screw) ×4 IMPLANT
SCREW CORT 3.5X12 (Screw) ×12 IMPLANT
SPLINT FIBERGLASS 3X12 (CAST SUPPLIES) ×6 IMPLANT
SPLINT PLASTER CAST XFAST 4X15 (CAST SUPPLIES) IMPLANT
SPLINT PLASTER XTRA FAST SET 4 (CAST SUPPLIES) ×2
SPONGE GAUZE 4X4 12PLY STER LF (GAUZE/BANDAGES/DRESSINGS) ×4 IMPLANT
SUT ETHILON 3 0 PS 1 (SUTURE) ×3 IMPLANT
SUT ETHILON 4 0 PS 2 18 (SUTURE) ×3 IMPLANT
SUT PROLENE 3 0 PS 2 (SUTURE) ×2 IMPLANT
SUT VIC AB 3-0 FS2 27 (SUTURE) ×2 IMPLANT
SUT VIC AB 3-0 PS1 18 (SUTURE) ×3
SUT VIC AB 3-0 PS1 18XBRD (SUTURE) ×1 IMPLANT
SUT VICRYL 4-0 PS2 18IN ABS (SUTURE) ×3 IMPLANT
SYR BULB 3OZ (MISCELLANEOUS) ×1 IMPLANT
SYR CONTROL 10ML LL (SYRINGE) IMPLANT
TOWEL OR 17X24 6PK STRL BLUE (TOWEL DISPOSABLE) ×3 IMPLANT
TUBE CONNECTING 20'X1/4 (TUBING) ×1
TUBE CONNECTING 20X1/4 (TUBING) ×2 IMPLANT
UNDERPAD 30X30 INCONTINENT (UNDERPADS AND DIAPERS) ×3 IMPLANT
WATER STERILE IRR 1000ML POUR (IV SOLUTION) ×3 IMPLANT

## 2014-03-13 NOTE — H&P (Signed)
Reason for Consult:Bilateral wrist fractures Referring Physician: ER  CC:I fell at home  HPI:  April Stephenson is an 54 y.o. right handed female who presents with bilateral wrist deformities after fall, pt ? Tripped over laundry basket, landing on both outstretched hands last pm.   Pain is rated at  7  /10 and is described as sharp.  Pain is constant.  Pain is made better by rest/immobilization, worse with motion.   Associated signs/symptoms:pt denies numbness / tingling to fingers Previous treatment:  n/a  Past Medical History  Diagnosis Date  . Migraine with aura   . Cancer     cervical cancer  . Depression     per Dr. Silvio Pate with psych, h/o suicide attempt at 54 y/o  . Arthritis     with longstanding foot and back pain after injuries, followed by Dr. Sharol Given  . Impingement syndrome of left shoulder 06/16/2011  . Suicidal overdose   . GERD (gastroesophageal reflux disease)     Past Surgical History  Procedure Laterality Date  . Tonsillectomy    . Foot surgery      R foot-great toe  . Abdominal hysterectomy  2000    TAH/BSO  . Colonoscopy    . Shoulder surgery      2013    Family History  Problem Relation Age of Onset  . Cancer Mother   . Depression Mother   . Cancer Father   . Heart disease Father     Social History:  reports that she has never smoked. She has never used smokeless tobacco. She reports that she drinks alcohol. She reports that she does not use illicit drugs.  Allergies:  Allergies  Allergen Reactions  . Diclofenac Sodium Other (See Comments)    Nausea Per pt can tolerate toradol IM fine  . Venlafaxine Other (See Comments)    syncope  . Tramadol Other (See Comments)    Dizzy, doesn't work for patient    Medications: I have reviewed the patient's current medications.  Results for orders placed or performed during the hospital encounter of 03/12/14 (from the past 48 hour(s))  CBC with Differential     Status: Abnormal   Collection Time: 03/12/14   8:38 PM  Result Value Ref Range   WBC 16.1 (H) 4.0 - 10.5 K/uL   RBC 3.80 (L) 3.87 - 5.11 MIL/uL   Hemoglobin 12.3 12.0 - 15.0 g/dL   HCT 36.6 36.0 - 46.0 %   MCV 96.3 78.0 - 100.0 fL   MCH 32.4 26.0 - 34.0 pg   MCHC 33.6 30.0 - 36.0 g/dL   RDW 13.5 11.5 - 15.5 %   Platelets 237 150 - 400 K/uL   Neutrophils Relative % 63 43 - 77 %   Neutro Abs 10.2 (H) 1.7 - 7.7 K/uL   Lymphocytes Relative 29 12 - 46 %   Lymphs Abs 4.6 (H) 0.7 - 4.0 K/uL   Monocytes Relative 7 3 - 12 %   Monocytes Absolute 1.1 (H) 0.1 - 1.0 K/uL   Eosinophils Relative 1 0 - 5 %   Eosinophils Absolute 0.1 0.0 - 0.7 K/uL   Basophils Relative 0 0 - 1 %   Basophils Absolute 0.0 0.0 - 0.1 K/uL  Basic metabolic panel     Status: Abnormal   Collection Time: 03/12/14  8:38 PM  Result Value Ref Range   Sodium 139 135 - 145 mmol/L    Comment: Please note change in reference range.   Potassium  4.6 3.5 - 5.1 mmol/L    Comment: Please note change in reference range.   Chloride 103 96 - 112 mEq/L   CO2 25 19 - 32 mmol/L   Glucose, Bld 105 (H) 70 - 99 mg/dL   BUN 22 6 - 23 mg/dL   Creatinine, Ser 1.24 (H) 0.50 - 1.10 mg/dL   Calcium 8.3 (L) 8.4 - 10.5 mg/dL   GFR calc non Af Amer 48 (L) >90 mL/min   GFR calc Af Amer 56 (L) >90 mL/min    Comment: (NOTE) The eGFR has been calculated using the CKD EPI equation. This calculation has not been validated in all clinical situations. eGFR's persistently <90 mL/min signify possible Chronic Kidney Disease.    Anion gap 11 5 - 15  Surgical pcr screen     Status: None   Collection Time: 03/13/14 12:55 AM  Result Value Ref Range   MRSA, PCR NEGATIVE NEGATIVE   Staphylococcus aureus NEGATIVE NEGATIVE    Comment:        The Xpert SA Assay (FDA approved for NASAL specimens in patients over 72 years of age), is one component of a comprehensive surveillance program.  Test performance has been validated by [facility] for patients greater than or equal to 17 year old. It is not  intended to diagnose infection nor to guide or monitor treatment.     Dg Wrist Complete Left  03/12/2014   CLINICAL DATA:  Status post fall this morning with bilateral wrist pain, bruising and deformity. Initial encounter.  EXAM: LEFT WRIST - COMPLETE 3+ VIEW  COMPARISON:  None.  FINDINGS: The patient has a mildly comminuted, dorsally angulated and displaced intra-articular fracture of the distal radius. Ulnar styloid fracture is also seen. There is marked soft tissue swelling about the wrist.  IMPRESSION: Acute distal radius and ulnar styloid fractures.   Electronically Signed   By: Inge Rise M.D.   On: 03/12/2014 19:59   Dg Wrist Complete Right  03/12/2014   CLINICAL DATA:  Status post fall this morning with bilateral wrist pain, bruising in deformity. Initial encounter.  EXAM: RIGHT WRIST - COMPLETE 3+ VIEW  COMPARISON:  None.  FINDINGS: The patient has a distal radius fracture with mild dorsal angulation. The fracture extends to the articular surface of the distal radius. No other fracture is identified. Soft tissue swelling is seen about the wrist.  IMPRESSION: Acute dorsally angulated distal right radius fracture.   Electronically Signed   By: Inge Rise M.D.   On: 03/12/2014 20:01    Pertinent items are noted in HPI. Temp:  [97.9 F (36.6 C)-99.1 F (37.3 C)] 98.3 F (36.8 C) (01/22 1048) Pulse Rate:  [86-132] 86 (01/22 1048) Resp:  [14-18] 17 (01/22 1048) BP: (102-132)/(56-91) 118/63 mmHg (01/22 1048) SpO2:  [94 %-99 %] 98 % (01/22 1048) General appearance: alert and cooperative Resp: clear to auscultation bilaterally Cardio: regular rate and rhythm GI: soft, non-tender; bowel sounds normal; no masses,  no organomegaly Extremities: lower extremities without edema, warm; upper extremities - nontender elbows, bilateral wrist deformities, moderate wrist and finger swelling, n/v intact distally   Assessment: Bilateral distal radius fractures Plan: ORIF bilateral  wrists I have discussed this treatment plan in detail with patient and family, including the risks of the recommended treatment and/or surgery, the benefits and the alternatives.  The patient and caregiver understands that additional treatment may be necessary. I have also discussed Dr. Burney Gauze possibly assisting/ performing one of her surgeries.  All questions  have been answered. Alanni Vader CHRISTOPHER 03/13/2014, 11:57 AM

## 2014-03-13 NOTE — Anesthesia Postprocedure Evaluation (Signed)
Anesthesia Post Note  Patient: April Stephenson  Procedure(s) Performed: Procedure(s) (LRB): OPEN REDUCTION INTERNAL FIXATION (ORIF) RADIAL FRACTURE (Bilateral)  Anesthesia type: General  Patient location: PACU  Post pain: Pain level controlled  Post assessment: Post-op Vital signs reviewed  Last Vitals: BP 161/87 mmHg  Pulse 82  Temp(Src) 36.7 C (Oral)  Resp 13  SpO2 100%  Post vital signs: Reviewed  Level of consciousness: sedated  Complications: No apparent anesthesia complications

## 2014-03-13 NOTE — Discharge Instructions (Signed)
Discharge Instructions:  Keep your dressing clean, dry and in place until instructed to remove by Dr. Zooey Schreurs.  If the dressing becomes dirty or wet call the office for instructions during business hours. Elevate the extremity to help with swelling, this will also help with any discomfort. Take your medication as prescribed. No lifting with the injured  extremity. If you feel that the dressing is too tight, you may loosen it, but keep it on; finger tips should be pink; if there is a concern, call the office. [phone] Ice may be used if the injury is a fracture, do not apply ice directly to the skin. Please call the office on the next business day after discharge to arrange a follow up appointment.  Call [phone] between the hours of 9am - 5pm M-Th or 9am - 1pm on Fri. For most hand injuries and/or conditions, you may return to work using the uninjured hand (one handed duty) within 24-72 hours.  A detailed note will be provided to you at your follow up appointment or may contact the office prior to your follow up.    

## 2014-03-13 NOTE — Op Note (Signed)
See note [id number]

## 2014-03-13 NOTE — Anesthesia Preprocedure Evaluation (Addendum)
Anesthesia Evaluation  Patient identified by MRN, date of birth, ID band Patient awake    Reviewed: Allergy & Precautions, NPO status , Patient's Chart, lab work & pertinent test results  Airway Mallampati: II  TM Distance: >3 FB Neck ROM: Full    Dental no notable dental hx.    Pulmonary neg pulmonary ROS,  breath sounds clear to auscultation  Pulmonary exam normal       Cardiovascular negative cardio ROS  Rhythm:Regular Rate:Normal     Neuro/Psych  Headaches, PSYCHIATRIC DISORDERS Depression negative psych ROS   GI/Hepatic Neg liver ROS, GERD-  Medicated,  Endo/Other  negative endocrine ROS  Renal/GU ARFRenal disease     Musculoskeletal  (+) Arthritis -,   Abdominal   Peds  Hematology negative hematology ROS (+)   Anesthesia Other Findings   Reproductive/Obstetrics negative OB ROS                             Anesthesia Physical Anesthesia Plan  ASA: II  Anesthesia Plan: General   Post-op Pain Management:    Induction: Intravenous  Airway Management Planned:   Additional Equipment:   Intra-op Plan:   Post-operative Plan: Extubation in OR  Informed Consent: I have reviewed the patients History and Physical, chart, labs and discussed the procedure including the risks, benefits and alternatives for the proposed anesthesia with the patient or authorized representative who has indicated his/her understanding and acceptance.   Dental advisory given  Plan Discussed with: CRNA  Anesthesia Plan Comments:         Anesthesia Quick Evaluation                                   Anesthesia Evaluation  Patient identified by MRN, date of birth, ID band Patient awake    Reviewed: Allergy & Precautions, H&P , NPO status , Patient's Chart, lab work & pertinent test results  Airway Mallampati: I TM Distance: >3 FB Neck ROM: Full    Dental   Pulmonary    Pulmonary  exam normal       Cardiovascular     Neuro/Psych  Headaches, Anxiety Depression    GI/Hepatic   Endo/Other    Renal/GU      Musculoskeletal   Abdominal   Peds  Hematology   Anesthesia Other Findings   Reproductive/Obstetrics                           Anesthesia Physical Anesthesia Plan  ASA: II  Anesthesia Plan: General   Post-op Pain Management:    Induction: Intravenous  Airway Management Planned: Oral ETT  Additional Equipment:   Intra-op Plan:   Post-operative Plan: Extubation in OR  Informed Consent: I have reviewed the patients History and Physical, chart, labs and discussed the procedure including the risks, benefits and alternatives for the proposed anesthesia with the patient or authorized representative who has indicated his/her understanding and acceptance.     Plan Discussed with: CRNA and Surgeon  Anesthesia Plan Comments:         Anesthesia Quick Evaluation

## 2014-03-13 NOTE — Progress Notes (Signed)
Patient with orders to discharge today but husband refuses to take patient home in inclement weather. Dr. Lenon Curt was paged and agreed to patient staying overnight. Discharge order will be modified.

## 2014-03-13 NOTE — Transfer of Care (Signed)
Immediate Anesthesia Transfer of Care Note  Patient: April Stephenson  Procedure(s) Performed: Procedure(s): OPEN REDUCTION INTERNAL FIXATION (ORIF) RADIAL FRACTURE (Bilateral)  Patient Location: PACU  Anesthesia Type:General  Level of Consciousness: awake and alert   Airway & Oxygen Therapy: Patient Spontanous Breathing and Patient connected to nasal cannula oxygen  Post-op Assessment: Report given to PACU RN and Post -op Vital signs reviewed and stable  Post vital signs: Reviewed and stable  Complications: No apparent anesthesia complications

## 2014-03-14 NOTE — Op Note (Signed)
NAMEMAKYLEE, April Stephenson              ACCOUNT NO.:  [id number]  MEDICAL RECORD NO.:  [id number]  LOCATION:  5N20C                        FACILITY:  [facility]  PHYSICIAN:  Sheral Apley. Braylei Totino, M.D.DATE OF BIRTH:  June 01, 1960  DATE OF PROCEDURE:  03/13/2014 DATE OF DISCHARGE:                              OPERATIVE REPORT   PREOPERATIVE DIAGNOSIS:  Displaced fracture, distal radius, left side.  POSTOPERATIVE DIAGNOSIS:  Displaced fracture, distal radius, left side.  PROCEDURE:  Open reduction and internal fixation with DVR plate and screws, standard left plate, three-part fracture and release of brachioradialis.  SURGEON:  Sheral Apley. Burney Gauze, M.D.  ASSISTANT:  None.  ANESTHESIA:  General.  TOURNIQUET TIME:  35 minutes.  COMPLICATIONS:  No complication.  DRAINS:  No drains.  DESCRIPTION OF PROCEDURE:  The patient was taken to operating suite for bilateral distal radius fixation.  Dr. Charma Igo performed the right side.  Paschal Dopp, performed the left side.  The left side was prepped and draped in the sterile fashion.  An Esmarch was used to exsanguinate the limb.  Tourniquet was inflated to 250 mmHg.  At this point in time, incision was made of the palmar aspect of the left distal radius and wrist area.  Skin was incised longitudinally, interval between the FCR and radial artery was identified, FCR was retracted laterally, radial midline, radial artery laterally.  Dissection was carried down the pronator quadratus.  We then subperiosteally stripped the pronator quadratus revealing a 3-part fracture, distal radius __________ brachioradialis off the volar aspect of the distal radius to aid in reduction.  Reduction was performed with longitudinal traction, flexion, and ulnar deviation.  A standard DVR plate was __________ to the volar aspect of the distal radius through the slotted hole.  I went ahead and used fluoroscopy to determine appropriate plate position.   Two more cortical screws were placed proximally followed by the smooth pegs distally.  We then irrigated and loosely closed in layers of 2-0 undyed Vicryl to cover the plate subcutaneously and a 3-0 Prolene subcuticular stitch on the skin.  Steri-Strips, 4 x 4s, fluffs, and a volar splint was applied. The patient tolerated the procedure well in a concealed fashion.     Sheral Apley Burney Gauze, M.D.     MAW/MEDQ  D:  03/13/2014  T:  03/13/2014  Job:  [id number]

## 2014-03-14 NOTE — Op Note (Signed)
April Stephenson, April Stephenson              ACCOUNT NO.:  [id number]  MEDICAL RECORD NO.:  [id number]  LOCATION:  5N20C                        FACILITY:  [facility]  PHYSICIAN:  Dennie Bible, MD    DATE OF BIRTH:  Jul 23, 1960  DATE OF PROCEDURE:  03/13/2014 DATE OF DISCHARGE:                              OPERATIVE REPORT   PREOPERATIVE DIAGNOSIS:  Bilateral distal radius fractures.  PROCEDURE:  Open reduction and internal fixation of the right distal radius with a DVR volar plate.  Of note, the left distal radius fracture was fixed by Dr. Burney Gauze.  See his separate operative note for details.  INDICATIONS:  Mrs. Bartko is a 54 year old lady who fell in outstretched hands last evening, sustaining closed displaced fractures to her radiuses.  Risks, benefits, and alternatives of surgery were thoroughly discussed with her.  She agreed to the treatment plan, which was a surgical fixation.  Consent was obtained.  DESCRIPTION OF PROCEDURE:  The patient was taken to the operating room, placed supine on the operating room table.  The right upper extremity was prepped and draped after general anesthesia was administered and successfully a time-out was performed.  The Esmarch was used to exsanguinate the arm and tourniquet was inflated to 250 mmHg.  An incision along the volar aspect of the wrist overlying the FCR tendon was then created.  The interval between the FCR tendon and the radial artery was then used to access the fracture site.  Dissection was carried down in this interval to the pronator quadratus muscle.  The muscle was reflected ulnarly off the bone.  The fracture site was visualized and debrided and reduced.  After adequate reduction, appropriate size DVR volar plate was chosen.  Temporary held in place with a K-wire where x-ray, fluoroscopy revealed a good length and placement.  Next, the radial shaft screws were each drilled, measured, and appropriate size of cortical screws were  placed.  Following, the distal radius screws were each drilled, measured, and appropriate-sized threaded locking screws were placed in the distal radius.  Final x-ray revealed a good screw in length and anatomic reduction of the fracture. The wound was irrigated.  The deep fascia was closed with interrupted 3- 0 Vicryl as well as the deep dermal layers.  Skin was closed with running 4-0 Monocryl.  Sterile dressing and splint were placed.  After release of the tourniquet, the fingers returned to nice pink color.  The patient tolerated the procedure well and was returned to recovery room in stable condition.     Dennie Bible, MD     HCC/MEDQ  D:  03/13/2014  T:  03/13/2014  Job:  [id number]

## 2014-03-16 ENCOUNTER — Encounter (HOSPITAL_COMMUNITY): Payer: Self-pay | Admitting: General Surgery

## 2014-03-28 NOTE — Discharge Summary (Signed)
April Stephenson, April Stephenson              ACCOUNT NO.:  [id number]  MEDICAL RECORD NO.:  [id number]  LOCATION:  5N20C                        FACILITY:  [facility]  PHYSICIAN:  Dennie Bible, MD    DATE OF BIRTH:  [birth year]  DATE OF ADMISSION:  03/12/2014 DATE OF DISCHARGE:  03/14/2014                              DISCHARGE SUMMARY   ADMITTING DIAGNOSIS:  Fall with bilateral distal radius fractures.  DISCHARGE DIAGNOSIS:  Fall with bilateral distal radius fractures.  HOSPITAL COURSE:  The patient sustained a fall and fractured both of her distal radius.  She was admitted to the hospital and scheduled for surgery on March 13, 2014.  Please see the operative note for details. Due to the extreme weather and discharge arrangements, the patient was kept overnight in the hospital for observation.  On March 14, 2014, she remained stable, and was ready for discharge.  DISCHARGE INSTRUCTIONS:  She is nonweightbearing to bilateral extremities.  She is in bilateral upper extremity splints, which she is to leave on.  She is to keep her arm elevated.  She is to move her fingers.  The patient has a significant chronic pain history, and she is followed by Pain Clinic for this.  She is currently on Percocet 10s.  I have advised her to contact her pain doctor's office to monitor and adjust her pain medication as needed.  She is given a short course of Keflex to take, and I will see her in the office in approximately 1 week.     Dennie Bible, MD     HCC/MEDQ  D:  03/28/2014  T:  03/28/2014  Job:  [id number]

## 2014-04-06 LAB — HM MAMMOGRAPHY: HM Mammogram: NORMAL

## 2014-07-13 ENCOUNTER — Encounter: Payer: Self-pay | Admitting: Family Medicine

## 2014-07-13 ENCOUNTER — Ambulatory Visit (INDEPENDENT_AMBULATORY_CARE_PROVIDER_SITE_OTHER): Payer: Federal, State, Local not specified - PPO | Admitting: Family Medicine

## 2014-07-13 VITALS — BP 120/84 | HR 92 | Temp 98.6°F | Wt 166.0 lb

## 2014-07-13 DIAGNOSIS — S62101S Fracture of unspecified carpal bone, right wrist, sequela: Secondary | ICD-10-CM

## 2014-07-13 DIAGNOSIS — M5489 Other dorsalgia: Secondary | ICD-10-CM | POA: Diagnosis not present

## 2014-07-13 DIAGNOSIS — S62102S Fracture of unspecified carpal bone, left wrist, sequela: Secondary | ICD-10-CM

## 2014-07-13 NOTE — Progress Notes (Signed)
Pre visit review using our clinic review tool, if applicable. No additional management support is needed unless otherwise documented below in the visit note.  She had B wrist fractures in 02/2014. Needs surgical repair x2 by Dr. Dayna Barker.  In the meantime has noted palm muscle atrophy B.  Pain in hands B and dec in grip B.  She has been on light duty for the last year.  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  She asks about disability and FMLA.   I filled out her FMLA related to per prev back pain, forms done at the [facility].  See scanned forms.  She has f/u with the pain clinic for June of 2016.  Still with R leg numbness from prev back injuries.  H/o bracing for foot drop in the past, per the [facility], but not needed now.    She has been seeing Dr. Sharol Given about her worker's comp injuries prev.    MDD per psych, no SI/HI, in therapy.    She had drug testing done at the [facility] in 2011, she asked me to list the med names (ie generics).  I did list them for the rxs on the sheet (ambien, benadryl, diazepam, nordiazepam, citalopram).  I didn't advise about this medically one way or the other.  I only provided med names for drugs on the sheet she had listed.    Meds, vitals, and allergies reviewed.   ROS: See HPI.  Otherwise, noncontributory.  nad ncat Affect slightly flat, but able to smile later in the encounter.  Mmm Neck supple, no LA rrr ctab abd soft Back exam: sore in the lower back, midline, w/o bruising.  Sore in the R lower paraspinal are Dec in muscle mass noted in the palms.  Pain with grip B No acute rash Gait slower than normal, likely from pain, but symmetric Dec in sensation in R medial shin at baseline

## 2014-07-13 NOTE — Patient Instructions (Signed)
I'll check with Drs. Sharol Given and [place].  We'll go from there.  Take care.

## 2014-07-14 NOTE — Assessment & Plan Note (Signed)
Per ortho.  Appreciate input.

## 2014-07-14 NOTE — Assessment & Plan Note (Addendum)
D/w pt.  She is still seeing pain clinic.  FMLA for back pain is reasonable.   I'll check with Dr. Lenon Curt and Dr. Sharol Given about her disability request, I want their input.  Patient is aware and agrees.  I'll await pain clinic f/u in the meantime.  >25 minutes spent in face to face time with patient, >50% spent in counselling or coordination of care.   Addendum- late entry.  I called the offices of Dr. Lenon Curt and Dr. Sharol Given about her disability request, I want their input.  Will await their advice.

## 2014-07-31 ENCOUNTER — Telehealth: Payer: Self-pay | Admitting: Family Medicine

## 2014-07-31 NOTE — Telephone Encounter (Signed)
I prev called the offices of Dr. Lenon Curt and Dr. Sharol Given about her disability request, I wanted their input. Will await their advice- note that I've been out of the office for a week and some notes may have come in during that time.   I would suspect that Lenon Curt and Sharol Given may want to see her back, so she should call them and ask about needing repeat visit (assuming no recent visits with them).  Thanks.

## 2014-07-31 NOTE — Telephone Encounter (Signed)
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  She wants to know what her next step needs to be

## 2014-08-03 NOTE — Telephone Encounter (Signed)
I'll await consult notes.  Thanks.

## 2014-08-03 NOTE — Telephone Encounter (Signed)
Patient states she saw Dr. Lenon Curt last week and he agreed and she is to see Dr. Sharol Given in July.

## 2014-08-12 ENCOUNTER — Telehealth: Payer: Self-pay | Admitting: Family Medicine

## 2014-08-12 NOTE — Telephone Encounter (Signed)
Called and spoke to Autumn and was advised that she saw a note that Dr. Damita Dunnings had called recently and wanted to know if she could help?  Autumn stated that Dr. Sharol Given had been out for a while, but is back now on a limited schedule. Autumn stated that patient has an appointment scheduled with Dr. Sharol Given 08/26/14 and she will make sure office notes from that visit are sent to Dr. Damita Dunnings as soon as the notes are ready after that visit. Autumn stated that if Dr. Damita Dunnings needs anything else to call back and she will try to help him and if not, notes will be sent after patient's upcoming appointment.

## 2014-08-12 NOTE — Telephone Encounter (Signed)
Thanks.  I'll await the OV notes.

## 2014-08-12 NOTE — Telephone Encounter (Signed)
Autumn from [facility] called regarding pt.  Please call back at [phone], option 0 and ask for Autumn. Thanks

## 2014-09-10 ENCOUNTER — Telehealth: Payer: Self-pay

## 2014-09-10 ENCOUNTER — Encounter: Payer: Self-pay | Admitting: Family Medicine

## 2014-09-10 NOTE — Telephone Encounter (Signed)
Called patient to notify her of being due for a Mammogram. Patient stated that she has already had one at the [facility] in [place]. Patient stated that the results came back normal. Chart abstracted.

## 2014-09-17 ENCOUNTER — Encounter: Payer: Self-pay | Admitting: *Deleted

## 2014-09-21 ENCOUNTER — Telehealth: Payer: Self-pay

## 2014-09-21 NOTE — Telephone Encounter (Signed)
Patient called into MR voicemail on Monday 09/21/14 at 2:48pm, requesting records from her first appointment here until 2000, she would like to be called back at [phone]. She will need to come in and fill out an ROI before we can process this request.

## 2014-09-23 ENCOUNTER — Encounter: Payer: Self-pay | Admitting: Family Medicine

## 2014-09-23 ENCOUNTER — Ambulatory Visit (INDEPENDENT_AMBULATORY_CARE_PROVIDER_SITE_OTHER): Payer: Federal, State, Local not specified - PPO | Admitting: Family Medicine

## 2014-09-23 VITALS — BP 110/76 | HR 80 | Temp 98.8°F | Wt 171.0 lb

## 2014-09-23 DIAGNOSIS — F329 Major depressive disorder, single episode, unspecified: Secondary | ICD-10-CM | POA: Diagnosis not present

## 2014-09-23 DIAGNOSIS — S62101S Fracture of unspecified carpal bone, right wrist, sequela: Secondary | ICD-10-CM | POA: Diagnosis not present

## 2014-09-23 DIAGNOSIS — F32A Depression, unspecified: Secondary | ICD-10-CM

## 2014-09-23 DIAGNOSIS — S62102S Fracture of unspecified carpal bone, left wrist, sequela: Secondary | ICD-10-CM

## 2014-09-23 NOTE — Patient Instructions (Signed)
Don't change your meds for now.  You can request your records from up front.  We'll go from there.  I'll await the follow up from Dr. Lenon Curt.

## 2014-09-23 NOTE — Assessment & Plan Note (Signed)
She continues to have hand pain and weakness with grip B.  She has a f/u nerve test with Dr. Lenon Curt pending.   She is limited to light duty currently, but there isn't a light duty job for her at work.   She is applying for disability through work.   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

## 2014-09-23 NOTE — Progress Notes (Signed)
Pre visit review using our clinic review tool, if applicable. No additional management support is needed unless otherwise documented below in the visit note.  She had seen Dr. Sharol Given and Dr. Lenon Curt.  She is getting records from both places, along with [facility].   She has a letter from Dr. Mardelle Matte who prev did her shoulder surgery.  She continues to have hand pain and weakness with grip B.  She has a f/u nerve test with Dr. Lenon Curt pending.   She continues to have ongoing back pain.   She is limited to light duty currently, but there isn't a light duty job for her at work.   She is applying for disability through work.    She has had psych f/u about her depressive sx.  She was worsening reportedly (no SI/HI) and was recently started on abilify in addition to her sertraline use.  She continues in counseling.  She is back in counseling regularly now per patient report.    Meds, vitals, and allergies reviewed.   ROS: See HPI.  Otherwise, noncontributory.  nad ncat Flat affect rrr ctab abd soft Dec muscle mass B hands, fingers are weak with grip/abduction/adduction B Contracture in B palms noted.   Sensation wnl B hands.  Normal elbow ROM.   Not full ROM B shoulders.

## 2014-09-23 NOTE — Telephone Encounter (Signed)
ROI form received this morning.

## 2014-09-23 NOTE — Assessment & Plan Note (Signed)
Per psych.  No SI/HI.

## 2014-09-24 NOTE — Telephone Encounter (Signed)
Records ready for pickup. She requested 1991-2008 on ROI form. Paper chart is MR [id number]. Patient notified that records are ready. She will come in either Monday or Tuesday to pick up.

## 2014-09-29 ENCOUNTER — Ambulatory Visit (INDEPENDENT_AMBULATORY_CARE_PROVIDER_SITE_OTHER): Payer: Self-pay | Admitting: Neurology

## 2014-09-29 ENCOUNTER — Ambulatory Visit (INDEPENDENT_AMBULATORY_CARE_PROVIDER_SITE_OTHER): Payer: Federal, State, Local not specified - PPO | Admitting: Neurology

## 2014-09-29 ENCOUNTER — Encounter: Payer: Self-pay | Admitting: Neurology

## 2014-09-29 ENCOUNTER — Telehealth: Payer: Self-pay | Admitting: Neurology

## 2014-09-29 DIAGNOSIS — M5489 Other dorsalgia: Secondary | ICD-10-CM

## 2014-09-29 DIAGNOSIS — M79602 Pain in left arm: Secondary | ICD-10-CM

## 2014-09-29 NOTE — Telephone Encounter (Signed)
Patient is calling as she needs a letter for work stating she had a doctor's appointment today.  Please call when ready for pickup.  Thanks!

## 2014-09-29 NOTE — Progress Notes (Signed)
Please refer to EMG and nerve conduction study procedure note. 

## 2014-09-29 NOTE — Telephone Encounter (Signed)
I wrote the letter. I called the patient. She requested I mail the letter to her. Letter is in the mail.

## 2014-09-29 NOTE — Procedures (Signed)
     HISTORY:  April Stephenson is a 54 year old patient with a history of a fall with subsequent bilateral wrist fractures on 03/11/2014. The patient has had residual tingling sensations and pain primarily in the left hand, some symptoms also noted on the right. She denies any neck pain or pain down arms, she feels weak in the hands. The patient is being evaluated for this issue.  NERVE CONDUCTION STUDIES:  Nerve conduction studies were performed on both upper extremities. The distal motor latencies and motor amplitudes for the median and ulnar nerves were within normal limits. The F wave latencies and nerve conduction velocities for these nerves were also normal. The sensory latencies for the median and ulnar nerves were normal.   EMG STUDIES:  EMG study was performed on the left upper extremity:  The first dorsal interosseous muscle reveals 2 to 4 K units with full recruitment. No fibrillations or positive waves were noted. The abductor pollicis brevis muscle reveals 2 to 4 K units with full recruitment. No fibrillations or positive waves were noted. The extensor indicis proprius muscle reveals 1 to 3 K units with full recruitment. No fibrillations or positive waves were noted. The pronator teres muscle reveals 2 to 3 K units with full recruitment. No fibrillations or positive waves were noted. The biceps muscle reveals 1 to 2 K units with full recruitment. No fibrillations or positive waves were noted. The triceps muscle reveals 2 to 4 K units with full recruitment. No fibrillations or positive waves were noted. The anterior deltoid muscle reveals 2 to 3 K units with full recruitment. No fibrillations or positive waves were noted. The cervical paraspinal muscles were tested at 2 levels. No abnormalities of insertional activity were seen at either level tested. There was poor relaxation.   IMPRESSION:  Nerve conduction studies done on both upper extremities were within normal limits. No  evidence of a neuropathy is seen. EMG evaluation of the left upper extremity is unremarkable, without evidence of an overlying cervical radiculopathy.  Jill Alexanders MD 09/29/2014 10:58 AM  [facility] [address] [place], [place] [address]  Phone [phone] Fax [phone]

## 2014-10-02 ENCOUNTER — Ambulatory Visit (INDEPENDENT_AMBULATORY_CARE_PROVIDER_SITE_OTHER): Payer: Federal, State, Local not specified - PPO | Admitting: Family Medicine

## 2014-10-02 ENCOUNTER — Encounter: Payer: Self-pay | Admitting: Family Medicine

## 2014-10-02 VITALS — BP 102/60 | HR 109 | Temp 98.7°F | Wt 171.2 lb

## 2014-10-02 DIAGNOSIS — S62101S Fracture of unspecified carpal bone, right wrist, sequela: Secondary | ICD-10-CM | POA: Diagnosis not present

## 2014-10-02 DIAGNOSIS — F329 Major depressive disorder, single episode, unspecified: Secondary | ICD-10-CM

## 2014-10-02 DIAGNOSIS — S62102S Fracture of unspecified carpal bone, left wrist, sequela: Secondary | ICD-10-CM | POA: Diagnosis not present

## 2014-10-02 DIAGNOSIS — F32A Depression, unspecified: Secondary | ICD-10-CM

## 2014-10-02 NOTE — Progress Notes (Signed)
Pre visit review using our clinic review tool, if applicable. No additional management support is needed unless otherwise documented below in the visit note.  She is applying for disability.  She can't stand for long due to back pain. Per patient, her hand pain didn't result from an injury on the job. She can't grip well due to pain.  She has muscle wasting in her hands.  Still is still in B wrist braces.    Depression.  Her mood has been worse recently.  No SI/HI, but she is more downcast and withdrawan.  She has had f/u with psychiatry.  Her mood has been so low this week she couldn't get into work. Continues on baseline meds.   Meds, vitals, and allergies reviewed.   ROS: See HPI.  Otherwise, noncontributory.  nad ncat Flat affect rrr ctab abd soft Dec muscle mass B hands, fingers are weak with grip/abduction/adduction B Contracture in B palms noted.  Sensation wnl B hands.  Normal elbow ROM.  Not full ROM B shoulders.

## 2014-10-02 NOTE — Patient Instructions (Addendum)
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  Keep using the wrist braces.  Take care.  Glad to see you.  If your mood isn't improving, then I want you to follow up with psychiatry.

## 2014-10-04 NOTE — Assessment & Plan Note (Signed)
I don't see how she'll be able to work with her hands the way they are, in addition to her back and shoulder pain.  I'll write a letter on her behalf.

## 2014-10-04 NOTE — Assessment & Plan Note (Signed)
I wrote her to return to work on Monday.  If her mood isn't improving, then I want her to f/u with psychiatry.  She agrees.

## 2014-10-05 ENCOUNTER — Encounter: Payer: Self-pay | Admitting: *Deleted

## 2014-10-12 ENCOUNTER — Telehealth: Payer: Self-pay | Admitting: *Deleted

## 2014-10-12 NOTE — Telephone Encounter (Signed)
Patient notified

## 2014-10-12 NOTE — Telephone Encounter (Signed)
Request was sent to [facility] on 10/06/14.  They require 14-16 business days for patient to receive records.

## 2014-10-12 NOTE — Telephone Encounter (Signed)
Patient signed a records release to receive all record for disability purposes.  She is calling to check on the status of this?  Please advise.

## 2014-11-02 ENCOUNTER — Telehealth: Payer: Self-pay | Admitting: Family Medicine

## 2014-11-02 NOTE — Telephone Encounter (Signed)
Pt dropped off disability forms  i also attached copy of last FMLA and letter  In Dr Josefine Class IN BOX Pt aware dr Damita Dunnings out of office till wednesday

## 2014-11-02 NOTE — Telephone Encounter (Signed)
I'll work on it when I can.  I won't have a chance to even look at it before Wednesday.

## 2014-11-10 NOTE — Telephone Encounter (Signed)
Pt aware disability retirement paperwork is ready for pick up  Copy to patient Copy to scan Copy to file

## 2014-11-26 ENCOUNTER — Other Ambulatory Visit: Payer: Self-pay | Admitting: Family Medicine

## 2014-11-26 DIAGNOSIS — M542 Cervicalgia: Secondary | ICD-10-CM

## 2014-12-09 ENCOUNTER — Ambulatory Visit
Admission: RE | Admit: 2014-12-09 | Discharge: 2014-12-09 | Disposition: A | Discharge status: Home / Self Care | Payer: Federal, State, Local not specified - PPO | Source: Ambulatory Visit | Attending: Family Medicine | Admitting: Family Medicine

## 2014-12-09 DIAGNOSIS — M542 Cervicalgia: Secondary | ICD-10-CM

## 2015-04-22 ENCOUNTER — Encounter: Payer: Self-pay | Admitting: Family Medicine

## 2015-04-22 ENCOUNTER — Ambulatory Visit (INDEPENDENT_AMBULATORY_CARE_PROVIDER_SITE_OTHER): Payer: Federal, State, Local not specified - PPO | Admitting: Family Medicine

## 2015-04-22 VITALS — BP 122/84 | HR 105 | Temp 98.1°F | Wt 191.2 lb

## 2015-04-22 DIAGNOSIS — R21 Rash and other nonspecific skin eruption: Secondary | ICD-10-CM | POA: Diagnosis not present

## 2015-04-22 MED ORDER — TRIAMCINOLONE ACETONIDE 0.1 % EX LOTN: 1.0000 "application " | TOPICAL_LOTION | Freq: Three times a day (TID) | CUTANEOUS | Status: DC | PRN

## 2015-04-22 NOTE — Patient Instructions (Signed)
Try the triamcinolone lotion on your scalp.  That should help.  If not then let me know.  Likely irritation, not infection.  Glad to see you.

## 2015-04-22 NOTE — Progress Notes (Signed)
Pre visit review using our clinic review tool, if applicable. No additional management support is needed unless otherwise documented below in the visit note.  Scalp itching.  Ears itching.  Rash on back. Mostly from shoulders up.  Had used another mousse prev but she had used in the past w/o troubles.  Sx started the next day.  No new foods.  No other triggers.  No FCNAVD.  No new meds.  No sx/rash on ext o/w.  Has tried dandruff shampoo with some mild relief of itching on the scalp.  No ulceration, no hair loss.  Tried benadryl w/o relief.   Meds, vitals, and allergies reviewed.   ROS: See HPI.  Otherwise, noncontributory.  nad Mild irritation sporadically seen on the scalp, with blanching macules irregularly distributed on the posterior neck bilaterally OP w/o ulceration No palmar lesions.

## 2015-04-22 NOTE — Assessment & Plan Note (Signed)
Likely a contact issue, d/w pt.  Avoid the prev product.   Use TAC prn on the scalp and should resolve. Doesn't appear infected.

## 2015-05-03 ENCOUNTER — Other Ambulatory Visit: Payer: Self-pay | Admitting: *Deleted

## 2015-05-03 NOTE — Telephone Encounter (Signed)
Received fax from [facility] asking for Rx for Lidocaine Ointment 5% and Diclofenace Gel 3%.  Also asking for glucose test strips.  Phoned patient to be sure she was asking for this and she stated they called her and offered her this at no charge.  She states she is not diabetic so she doesn't need the glucose test strips.  Patient does want the Lidocaine and Diclofenac.  Form in your In Box.

## 2015-05-04 NOTE — Telephone Encounter (Signed)
Rx denied and faxed to pharmacy.

## 2015-05-04 NOTE — Telephone Encounter (Signed)
I wouldn't do either.   If she was contacted by them when she isn't diabetic, then this is suspect.  I don't trust them to send the correct rx.  I didn't fill either.  Thanks.

## 2015-05-04 NOTE — Addendum Note (Signed)
Addended by: Josetta Huddle on: 05/04/2015 09:52 AM   Modules accepted: Orders, Medications

## 2015-08-17 ENCOUNTER — Other Ambulatory Visit: Payer: Self-pay | Admitting: *Deleted

## 2015-09-24 ENCOUNTER — Ambulatory Visit: Payer: Self-pay | Admitting: Family Medicine

## 2015-09-27 ENCOUNTER — Encounter: Payer: Self-pay | Admitting: Family Medicine

## 2015-09-27 ENCOUNTER — Ambulatory Visit (INDEPENDENT_AMBULATORY_CARE_PROVIDER_SITE_OTHER): Payer: Federal, State, Local not specified - PPO | Admitting: Family Medicine

## 2015-09-27 VITALS — BP 114/60 | HR 97 | Temp 97.7°F | Wt 194.2 lb

## 2015-09-27 DIAGNOSIS — G43919 Migraine, unspecified, intractable, without status migrainosus: Secondary | ICD-10-CM

## 2015-09-27 DIAGNOSIS — R51 Headache: Secondary | ICD-10-CM | POA: Diagnosis not present

## 2015-09-27 DIAGNOSIS — R519 Headache, unspecified: Secondary | ICD-10-CM

## 2015-09-27 MED ORDER — PROMETHAZINE HCL 25 MG/ML IJ SOLN
25.0000 mg | Freq: Once | INTRAMUSCULAR | Status: AC
  Administered 2015-09-27: 25 mg via INTRAMUSCULAR

## 2015-09-27 MED ORDER — KETOROLAC TROMETHAMINE 60 MG/2ML IM SOLN
60.0000 mg | Freq: Once | INTRAMUSCULAR | Status: AC
  Administered 2015-09-27: 60 mg via INTRAMUSCULAR

## 2015-09-27 NOTE — Progress Notes (Signed)
Subjective:   Patient ID: April Stephenson, female    DOB: [birth year], 55 y.o.   MRN: [id number]  April Stephenson is a pleasant 55 y.o. year old female pt of Dr. Damita Dunnings, new to me, who presents to clinic today with Headache and Nausea  on 09/27/2015  HPI:  H/o migraines- this migraine has been ongoing for 3 days. Refractory to imitrex.  Nausea, vomiting, photophobia and phonophobia, typical of her migraines.  Current Outpatient Prescriptions on File Prior to Visit  Medication Sig Dispense Refill  . ARIPiprazole (ABILIFY) 5 MG tablet Take 5 mg by mouth daily.    . cyclobenzaprine (FLEXERIL) 10 MG tablet Take 10 mg by mouth 2 (two) times daily as needed for muscle spasms.    . diazepam (VALIUM) 5 MG tablet Take 5 mg by mouth every 6 (six) hours as needed for anxiety.    Marland Kitchen HYDROcodone-acetaminophen (NORCO) 10-325 MG per tablet Take 1 tablet by mouth 3 (three) times daily.    . ranitidine (ZANTAC) 150 MG tablet Take 150 mg by mouth 2 (two) times daily as needed (acid reflux).    . sertraline (ZOLOFT) 100 MG tablet Take 200 mg by mouth daily.     . SUMAtriptan (IMITREX) 50 MG tablet Take 50 mg by mouth every 2 (two) hours as needed for headache. May repeat in 2 hours if headache persists or recurs. Do not exceed 200mg  per day.    Marland Kitchen VITAMIN E PO Take 500 mg by mouth daily.     No current facility-administered medications on file prior to visit.     Allergies  Allergen Reactions  . Diclofenac Sodium Other (See Comments)    Nausea Per pt can tolerate toradol IM fine  . Venlafaxine Other (See Comments)    syncope  . Tramadol Other (See Comments)    Dizzy, doesn't work for patient    Past Medical History:  Diagnosis Date  . Arthritis    with longstanding foot and back pain after injuries, followed by Dr. Sharol Given  . Cancer (HCC)    cervical cancer  . Depression    per Dr. Silvio Pate with psych, h/o suicide attempt at 55 y/o  . GERD (gastroesophageal reflux disease)   . Impingement  syndrome of left shoulder 06/16/2011  . Migraine with aura   . Suicidal overdose [facility])     Past Surgical History:  Procedure Laterality Date  . ABDOMINAL HYSTERECTOMY  2000   TAH/BSO  . COLONOSCOPY    . FOOT SURGERY     R foot-great toe  . ORIF RADIAL FRACTURE Bilateral 03/13/2014   Procedure: OPEN REDUCTION INTERNAL FIXATION (ORIF) RADIAL FRACTURE;  Surgeon: Dayna Barker, MD;  Location: [facility];  Service: Plastics;  Laterality: Bilateral;  . SHOULDER SURGERY     2013  . TONSILLECTOMY      Family History  Problem Relation Age of Onset  . Cancer Mother   . Depression Mother   . Cancer Father   . Heart disease Father     Social History   Social History  . Marital status: Divorced    Spouse name: N/A  . Number of children: N/A  . Years of education: N/A   Occupational History  . Not on file.   Social History Main Topics  . Smoking status: Never Smoker  . Smokeless tobacco: Never Used  . Alcohol use 0.0 oz/week     Comment: occassionally  . Drug use: No  . Sexual activity: No   Other Topics  Concern  . Not on file   Social History Narrative   3 kids, 1 with down's, 1 with asperger's   Divorced   Works at [employer], former Counselling psychologist    The PMH, [place], Social History, Family History, Medications, and allergies have been reviewed in [facility], and have been updated if relevant.   Review of Systems  Constitutional: Negative.   Eyes: Positive for photophobia. Negative for visual disturbance.  Gastrointestinal: Positive for nausea and vomiting.  Neurological: Positive for headaches. Negative for dizziness, tremors, seizures, syncope, facial asymmetry, speech difficulty, weakness, light-headedness and numbness.  All other systems reviewed and are negative.      Objective:    BP 114/60   Pulse 97   Temp 97.7 F (36.5 C) (Oral)   Wt 194 lb 4 oz (88.1 kg)   SpO2 97%   BMI 30.42 kg/m    Physical Exam  Constitutional: She is oriented to person, place, and time.  She appears well-developed.  Appears uncomfortable, obvious photophobia  HENT:  Head: Normocephalic.  Pulmonary/Chest: Effort normal.  Musculoskeletal: Normal range of motion.  Neurological: She is alert and oriented to person, place, and time. No cranial nerve deficit.  Skin: Skin is dry.  Psychiatric: She has a normal mood and affect. Her behavior is normal. Judgment and thought content normal.          Assessment & Plan:   Headache, unspecified headache type - Plan: ketorolac (TORADOL) injection 60 mg, promethazine (PHENERGAN) injection 25 mg No Follow-up on file.

## 2015-09-27 NOTE — Assessment & Plan Note (Signed)
New- refractory to her home abortive triptan therapy. IM toradol and phenergan given to pt. Call or return to clinic prn if these symptoms worsen or fail to improve as anticipated. The patient indicates understanding of these issues and agrees with the plan.

## 2015-09-27 NOTE — Progress Notes (Signed)
Pre visit review using our clinic review tool, if applicable. No additional management support is needed unless otherwise documented below in the visit note. 

## 2015-11-29 ENCOUNTER — Other Ambulatory Visit: Payer: Self-pay | Admitting: *Deleted

## 2015-12-21 ENCOUNTER — Encounter: Payer: Self-pay | Admitting: Internal Medicine

## 2015-12-21 ENCOUNTER — Ambulatory Visit (INDEPENDENT_AMBULATORY_CARE_PROVIDER_SITE_OTHER): Payer: Federal, State, Local not specified - PPO | Admitting: Internal Medicine

## 2015-12-21 ENCOUNTER — Ambulatory Visit: Payer: Self-pay | Admitting: Internal Medicine

## 2015-12-21 VITALS — BP 118/78 | HR 94 | Temp 98.7°F | Wt 206.8 lb

## 2015-12-21 DIAGNOSIS — J0100 Acute maxillary sinusitis, unspecified: Secondary | ICD-10-CM | POA: Diagnosis not present

## 2015-12-21 MED ORDER — AMOXICILLIN-POT CLAVULANATE 875-125 MG PO TABS: 1.0000 | ORAL_TABLET | Freq: Two times a day (BID) | ORAL | 0 refills | Status: DC

## 2015-12-21 NOTE — Progress Notes (Signed)
HPI  Pt presents to the clinic today with c/o facial pain and pressure, nasal congestion, sore throat and cough. This started 3 weeks ago. She is blowing green-blood tinged mucous out of her nose. She denies difficulty swallowing. The cough is productive of green mucous. She denies fever, chills or body aches. She has tried Estate manager/land agent with some relief. She has not had sick contacts that she is aware of.   Review of Systems    Past Medical History:  Diagnosis Date  . Arthritis    with longstanding foot and back pain after injuries, followed by Dr. Sharol Given  . Cancer (HCC)    cervical cancer  . Depression    per Dr. Silvio Pate with psych, h/o suicide attempt at 55 y/o  . GERD (gastroesophageal reflux disease)   . Impingement syndrome of left shoulder 06/16/2011  . Migraine with aura   . Suicidal overdose ([place])     Family History  Problem Relation Age of Onset  . Cancer Mother   . Depression Mother   . Cancer Father   . Heart disease Father     Social History   Social History  . Marital status: Divorced    Spouse name: N/A  . Number of children: N/A  . Years of education: N/A   Occupational History  . Not on file.   Social History Main Topics  . Smoking status: Never Smoker  . Smokeless tobacco: Never Used  . Alcohol use 0.0 oz/week     Comment: occassionally  . Drug use: No  . Sexual activity: No   Other Topics Concern  . Not on file   Social History Narrative   3 kids, 1 with down's, 1 with asperger's   Divorced   Works at [employer], former Counselling psychologist     Allergies  Allergen Reactions  . Diclofenac Sodium Other (See Comments)    Nausea Per pt can tolerate toradol IM fine  . Venlafaxine Other (See Comments)    syncope  . Tramadol Other (See Comments)    Dizzy, doesn't work for patient     Constitutional: Positive headache. Denies fatigue, fever or abrupt weight changes.  HEENT:  Positive facial pain, nasal congestion and sore throat. Denies  eye redness, ear pain, ringing in the ears, wax buildup, runny nose or bloody nose. Respiratory: Positive cough. Denies difficulty breathing or shortness of breath.  Cardiovascular: Denies chest pain, chest tightness, palpitations or swelling in the hands or feet.   No other specific complaints in a complete review of systems (except as listed in HPI above).  Objective:  BP 118/78   Pulse 94   Temp 98.7 F (37.1 C) (Oral)   Wt 206 lb 12 oz (93.8 kg)   SpO2 97%   BMI 32.38 kg/m   General: Appears her stated age, well developed, well nourished in NAD. HEENT: Head: normal shape and size, maxillary sinus tenderness noted; Eyes: sclera white, no icterus, conjunctiva pink; Ears: Tm's gray and intact, normal light reflex; Nose: mucosa boggy and moist, septum midline; Throat/Mouth: + PND. Teeth present, mucosa erythematous and moist, no exudate noted, no lesions or ulcerations noted.  Neck:  No adenopathy noted.  Cardiovascular: Normal rate and rhythm. S1,S2 noted.  No murmur, rubs or gallops noted.  Pulmonary/Chest: Normal effort and positive vesicular breath sounds. No respiratory distress. No wheezes, rales or ronchi noted.      Assessment & Plan:   Acute bacterial sinusitis  Can use a Neti Pot which  can be purchased from your local drug store. Flonase 2 sprays each nostril for 3 days and then as needed. eRx for Augmentin BID for 10 days  RTC as needed or if symptoms persist. Webb Silversmith, NP

## 2015-12-21 NOTE — Patient Instructions (Signed)

## 2016-01-25 IMAGING — CT CT HEAD W/O CM
2 series · 17 of 30 positions shown, 20 images · non-contrast
Comparison: 04/05/2006.

CLINICAL DATA: Mental status changes.

EXAM:
CT HEAD WITHOUT CONTRAST
TECHNIQUE: Contiguous axial images were obtained from the base of the skull
through the vertex without intravenous contrast.

[Series 2: head w/o · axial · non-contrast · 0.43mm/px · z∈[-194,-89]mm · 9 of 27 slices shown, 12 images]
[im 3/27  brain]
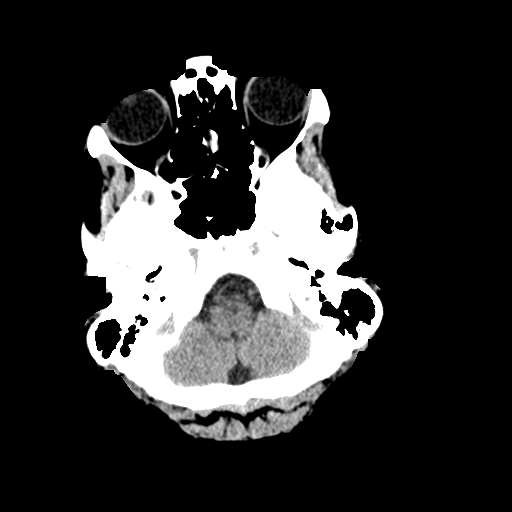
[im 3/27  bone]
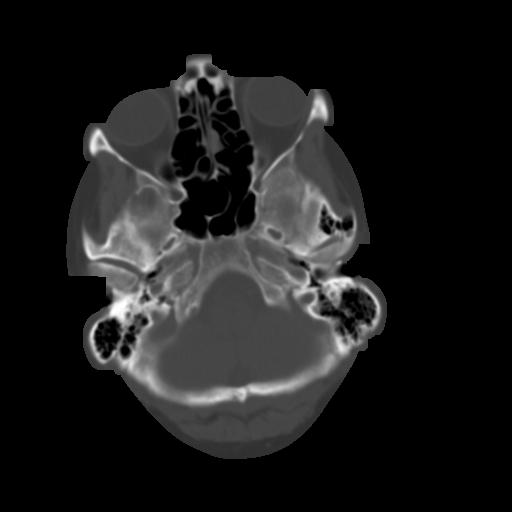
[im 6/27  brain]
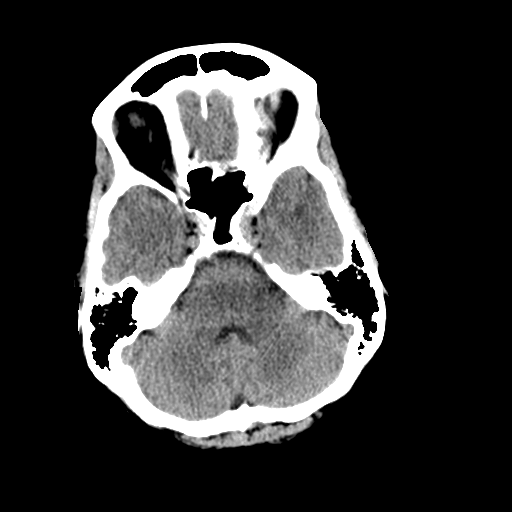
[im 8/27  brain]
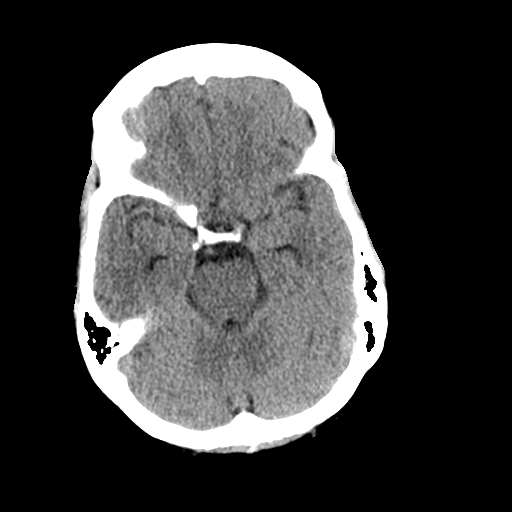
[im 11/27  brain]
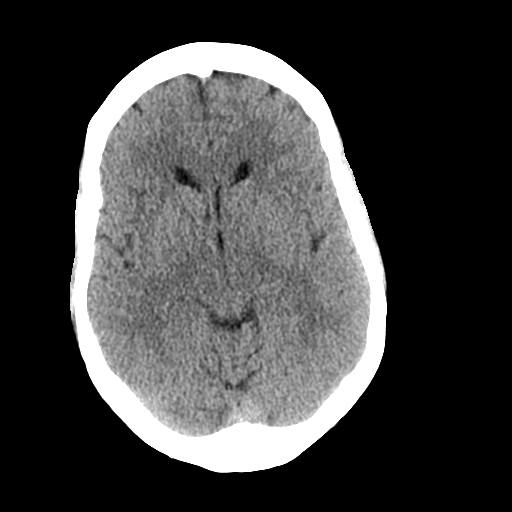
[im 14/27  brain]
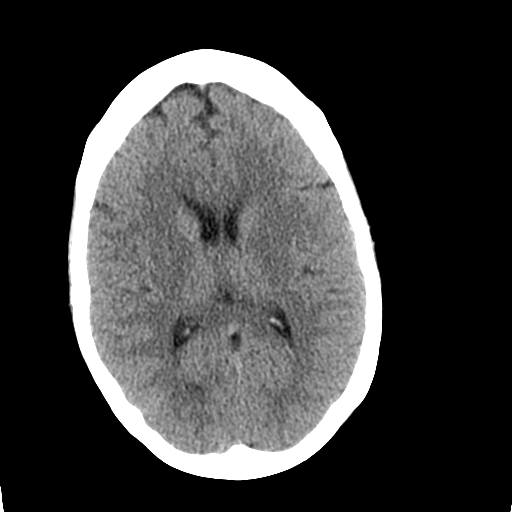
[im 14/27  bone]
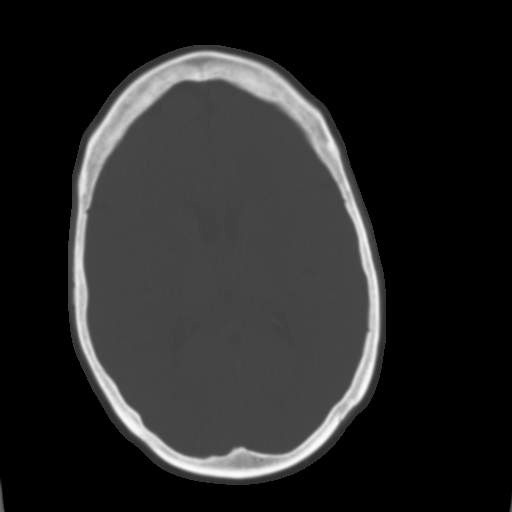
[im 16/27  brain]
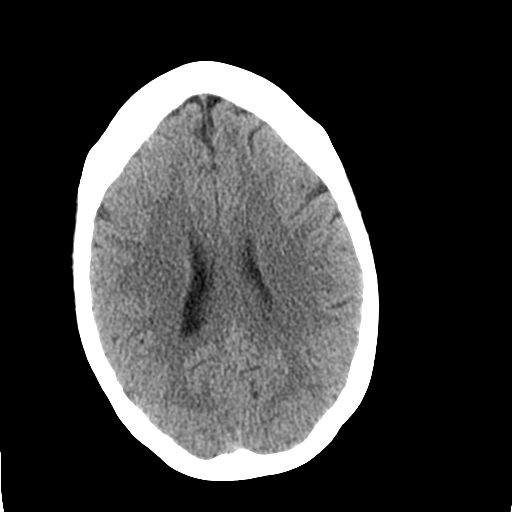
[im 19/27  brain]
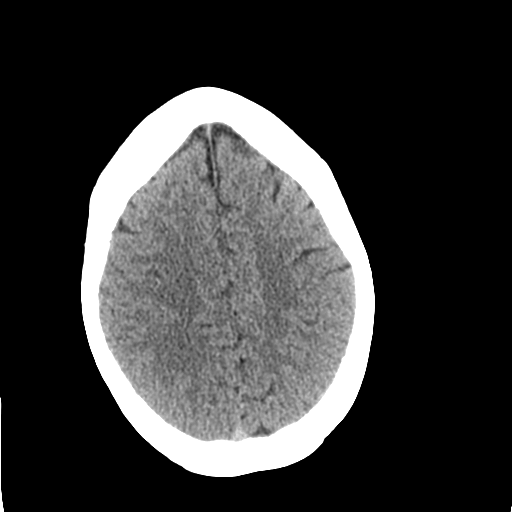
[im 21/27  brain]
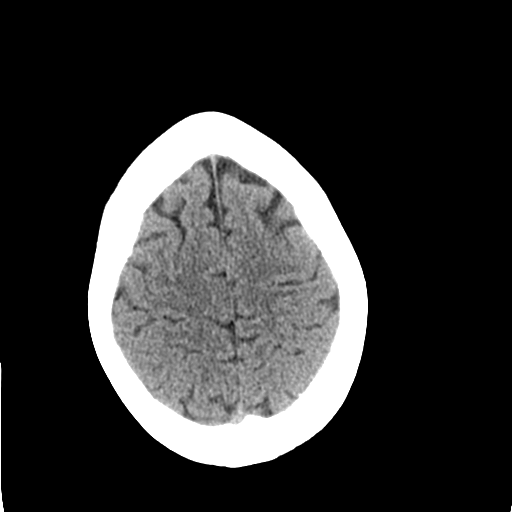
[im 24/27  brain]
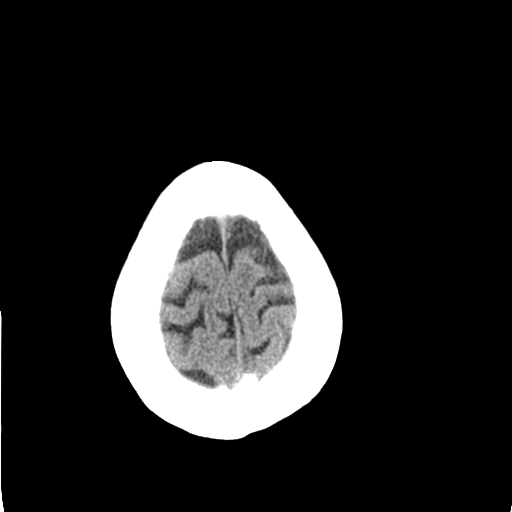
[im 24/27  bone]
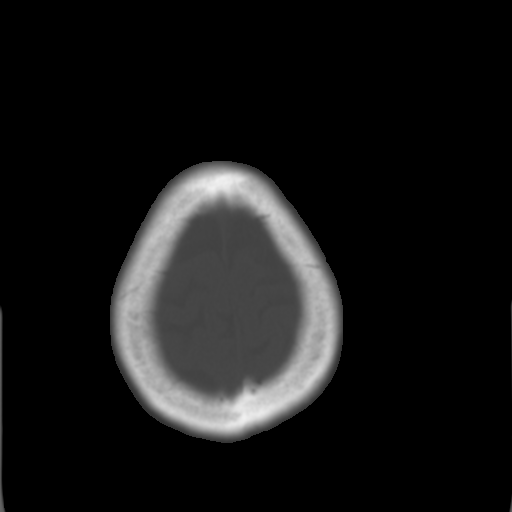

[Series 3: bone windows · axial · 0.43mm/px · z∈[-192,-87]mm · 8 of 45 slices shown]
[im 5/45  bone]
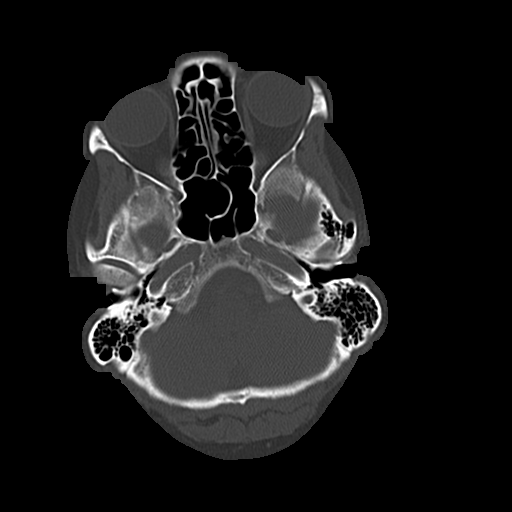
[im 10/45  bone]
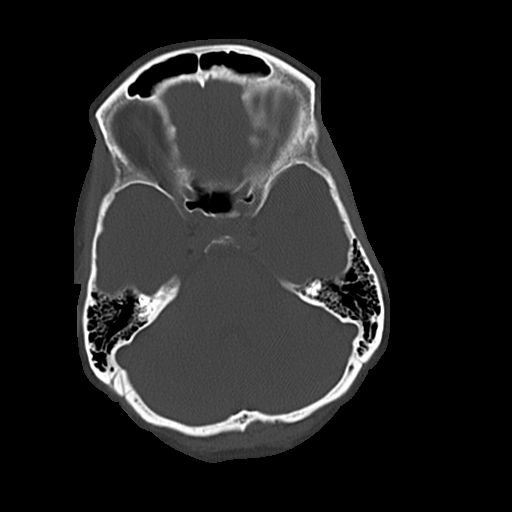
[im 15/45  bone]
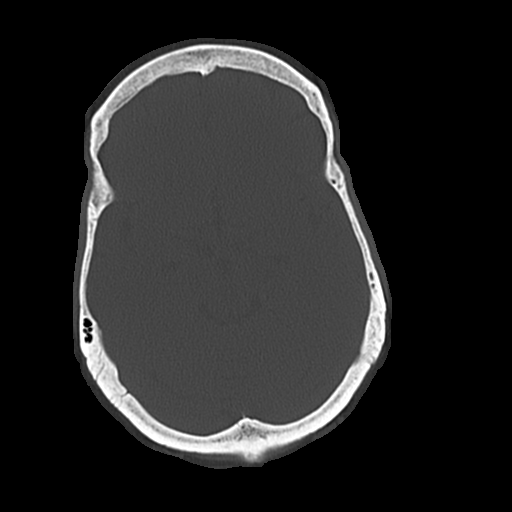
[im 20/45  bone]
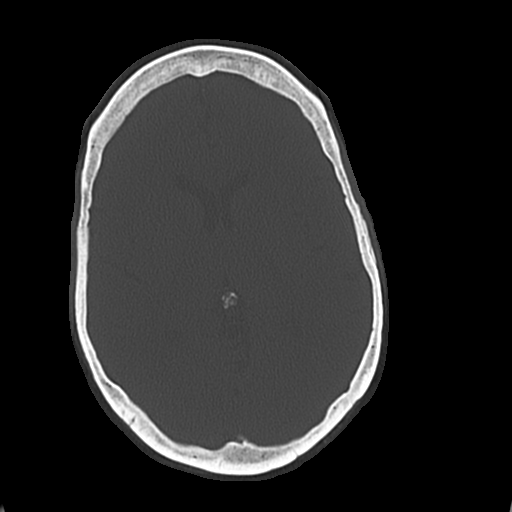
[im 25/45  bone]
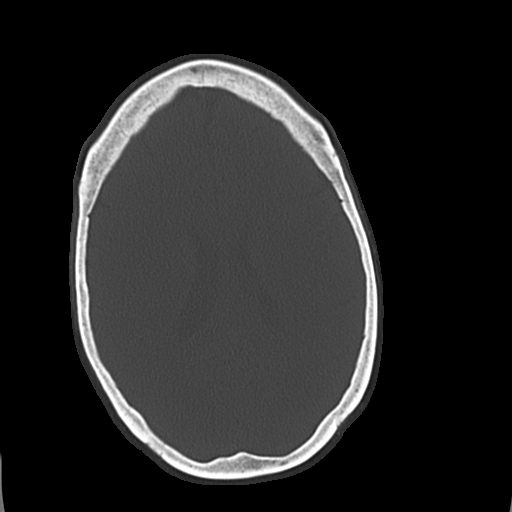
[im 30/45  bone]
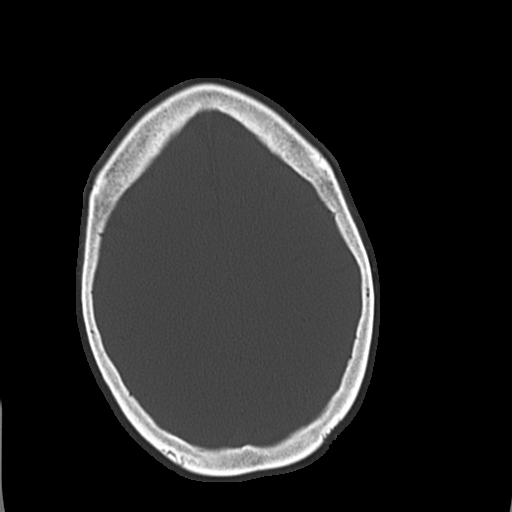
[im 35/45  bone]
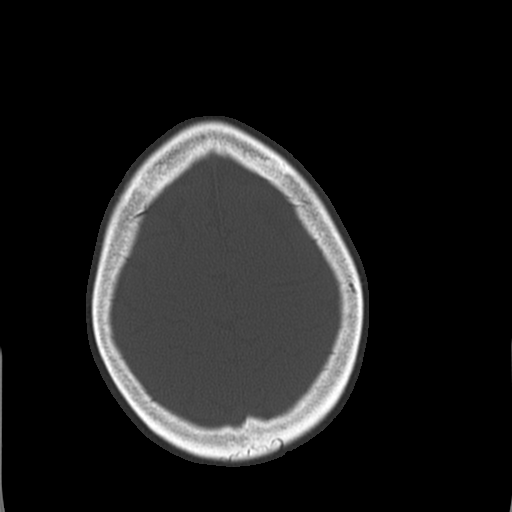
[im 40/45  bone]
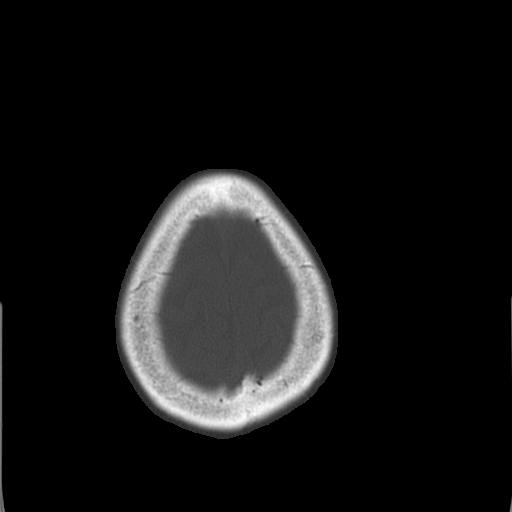

[17 of 30 positions shown; findings below may reference images not displayed]

FINDINGS: The ventricles are normal in size and configuration. No extra-axial
fluid collections are identified. The gray-white differentiation is
normal. No CT findings for acute intracranial process such as
hemorrhage or infarction. No mass lesions. The brainstem and
cerebellum are grossly normal.

The bony structures are intact. The paranasal sinuses and mastoid
air cells are clear. The globes are intact.
IMPRESSION: No acute intracranial findings or mass lesion.

## 2016-01-25 IMAGING — CR DG CHEST 1V
1 series · 1 of 1 positions shown · non-contrast
Comparison: None.

CLINICAL DATA: Drug overdose

EXAM:
CHEST - 1 VIEW

[AP]
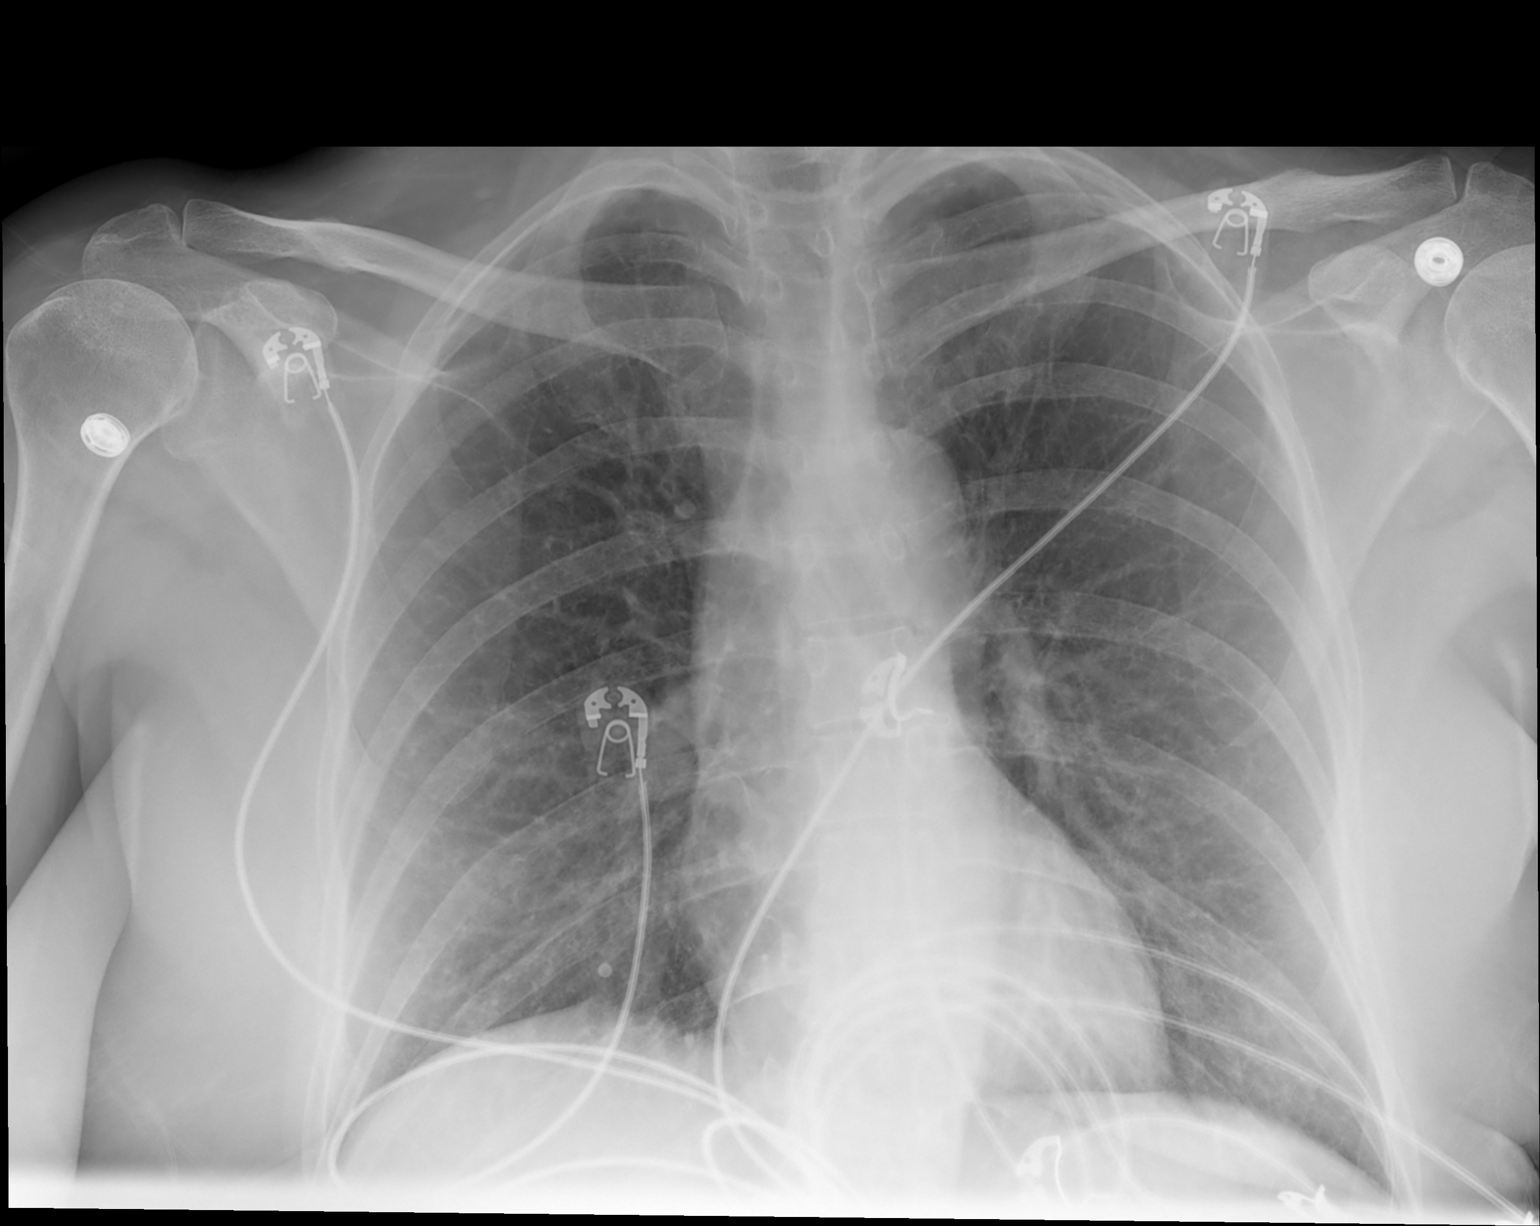

[1 of 1 positions shown; findings below may reference images not displayed]

FINDINGS: The heart size and mediastinal contours are within normal limits.
Both lungs are clear. The visualized skeletal structures are
unremarkable.
IMPRESSION: No active disease.

## 2016-01-27 ENCOUNTER — Encounter: Payer: Self-pay | Admitting: Family Medicine

## 2016-01-27 ENCOUNTER — Ambulatory Visit (INDEPENDENT_AMBULATORY_CARE_PROVIDER_SITE_OTHER): Payer: Federal, State, Local not specified - PPO | Admitting: Family Medicine

## 2016-01-27 DIAGNOSIS — M7062 Trochanteric bursitis, left hip: Secondary | ICD-10-CM

## 2016-01-27 DIAGNOSIS — M7060 Trochanteric bursitis, unspecified hip: Secondary | ICD-10-CM | POA: Insufficient documentation

## 2016-01-27 DIAGNOSIS — M7061 Trochanteric bursitis, right hip: Secondary | ICD-10-CM | POA: Insufficient documentation

## 2016-01-27 NOTE — Patient Instructions (Signed)
Likely trochanteric bursitis.  Ice it down - 5 minutes on and 5 minutes off.  If not better, then the pain clinic may be able to inject the area.  Take care.  Glad to see you.

## 2016-01-27 NOTE — Assessment & Plan Note (Signed)
Likely trochanteric bursitis.  Ice it down - 5 minutes on and 5 minutes off.  If not better, then the pain clinic may be able to inject the area.  D/w pt about plan and anatomy.  She agrees. Continue baseline meds.

## 2016-01-27 NOTE — Progress Notes (Signed)
New pain, different from prev chronic back pain.  Near L hip.  Started about 1 week ago.  No clear trigger. Local, doesn't radiate.  Better with hot bath.  Worse with walking, sitting, laying on L side at night.  No bruising.  Not puffy.  No R sided sx.    Still on baseline meds per pain clinic.  D/w pt.    Meds, vitals, and allergies reviewed.   ROS: Per HPI unless specifically indicated in ROS section   nad ncat rrr ctab L hip with normal ROM, no pain on int rotation. L greater troch area ttp.  L ITB slightly ttp.  Able to bear weight.  Pain on L greater trochanter with testing L hip abductors.

## 2016-01-27 NOTE — Progress Notes (Signed)
Pre visit review using our clinic review tool, if applicable. No additional management support is needed unless otherwise documented below in the visit note. 

## 2016-04-18 ENCOUNTER — Telehealth: Payer: Self-pay | Admitting: Family Medicine

## 2016-04-18 NOTE — Telephone Encounter (Signed)
Pt dropped off [facility] handicap form. Please call pt when ready to pick up. Placing in rx tower Thanks

## 2016-04-18 NOTE — Telephone Encounter (Signed)
Form is on your desk.

## 2016-04-19 NOTE — Telephone Encounter (Signed)
I work on the hardcopy.  Thanks. 

## 2016-05-22 ENCOUNTER — Telehealth: Payer: Self-pay | Admitting: Family Medicine

## 2016-05-22 NOTE — Telephone Encounter (Signed)
[facility] Patient Name: April Stephenson DOB: 02/24/1960 Initial Comment Caller states went to pain management in which she had a fainting a spell. Nurse Assessment Nurse: Markus Daft, RN, Sherre Poot Date/Time (Eastern Time): 05/22/2016 3:59:35 PM Confirm and document reason for call. If symptomatic, describe symptoms. ---Caller states went to pain management in which she had a fainting a spell. Hasn't felt good for 2 days. Gets cold and clammy and sees spots in front of her eyes. HR is elevated and BP dropped. Does the patient have any new or worsening symptoms? ---Yes Will a triage be completed? ---Yes Related visit to physician within the last 2 weeks? ---Yes Does the PT have any chronic conditions? (i.e. diabetes, asthma, etc.) ---No Is this a behavioral health or substance abuse call? ---No Guidelines Guideline Title Affirmed Question Affirmed Notes Fainting [1] Fainted > 15 minutes ago AND [2] still looks pale (pale skin, pallor) Final Disposition User Go to ED Now Markus Daft, RN, [place] Comments Happened at 9:30 am at [place] office, and real dizzy again 20 min. ago. Referrals [facility] - ED Disagree/Comply: Comply

## 2016-05-23 NOTE — Telephone Encounter (Signed)
Await ER notes. Thanks.  

## 2016-06-27 ENCOUNTER — Ambulatory Visit: Payer: Self-pay | Admitting: Family Medicine

## 2016-10-02 ENCOUNTER — Encounter: Payer: Self-pay | Admitting: Family Medicine

## 2016-10-02 ENCOUNTER — Ambulatory Visit (INDEPENDENT_AMBULATORY_CARE_PROVIDER_SITE_OTHER): Payer: Federal, State, Local not specified - PPO | Admitting: Family Medicine

## 2016-10-02 VITALS — BP 138/92 | HR 96 | Temp 97.8°F | Wt 165.0 lb

## 2016-10-02 DIAGNOSIS — F329 Major depressive disorder, single episode, unspecified: Secondary | ICD-10-CM

## 2016-10-02 DIAGNOSIS — G4700 Insomnia, unspecified: Secondary | ICD-10-CM

## 2016-10-02 DIAGNOSIS — F32A Depression, unspecified: Secondary | ICD-10-CM

## 2016-10-02 MED ORDER — ZOLPIDEM TARTRATE 10 MG PO TABS: 10.0000 mg | ORAL_TABLET | Freq: Every day | ORAL | 0 refills | Status: DC

## 2016-10-02 NOTE — Progress Notes (Signed)
Insomnia.  Hasn't been sleeping much at all in the last few days.  Recently started on lexapro in the meantime.  The trouble sleeping clearly got worse a few days after starting lexapro.  No SI/HI.  Had to double up on ambien in the meantime, ran short a few pills.  Normally filled by [place].  Nervous and jittery, more on the lexapro but also could be from not sleeping.  More irritable, also from either dec in sleep vs lexparo.  Has been on lexapro about 10 days.    She usually takes [place] chronically, 10mg  at night.    Meds, vitals, and allergies reviewed.   ROS: Per HPI unless specifically indicated in ROS section   GEN: nad, alert and oriented, speech and judgement intact but looks slightly worried.  HEENT: mucous membranes moist NECK: supple w/o LA CV: rrr.  PULM: ctab, no inc wob ABD: soft, +bs EXT: no edema

## 2016-10-02 NOTE — Patient Instructions (Signed)
Call the [place].  Tell them we talked about cutting back to 5mg  of lexapro a day since it may have may you more jittery and cause some worsening insomnia. Max 10mg  ambien per day.  Update me as needed.  Take care.  Glad to see you.

## 2016-10-03 NOTE — Assessment & Plan Note (Signed)
With insomnia at baseline.D/w pt about options.  Recently started on Lexapro. She has been more jittery in the meantime, having more trouble with insomnia in the meantime. She has a prescription for Ambien from the [place] but she is not able to fill it today. She should be able to fill it at the end of the week. I wrote her prescription for Ambien 10 mg, 1 pill at night as needed. Advised patient not to take extra Ambien. Dispense only 6 pills. Decrease Lexapro down to 5 mg a day. [facility] be [facility] to [facility] this. I think it is likely not a great idea for her to stop Lexapro cold [place]. She understood. Okay for outpatient follow-up.

## 2016-10-03 NOTE — Assessment & Plan Note (Signed)
See above

## 2016-10-04 ENCOUNTER — Telehealth: Payer: Self-pay

## 2016-10-04 NOTE — Telephone Encounter (Signed)
Pt left v/m; pt contacted the [place] and pt should get ambien by 10/13/16.pt will be out of med on 10/07/16.pt wants to know what to do. Pt request cb.

## 2016-10-05 MED ORDER — ZOLPIDEM TARTRATE 10 MG PO TABS: 10.0000 mg | ORAL_TABLET | Freq: Every day | ORAL | 0 refills | Status: DC

## 2016-10-05 NOTE — Telephone Encounter (Signed)
Left detailed message on voicemail. Rx left at front desk for pick up.  

## 2016-10-05 NOTE — Telephone Encounter (Signed)
6 more tabs printed, the rest has to come through the [place].  Thanks.

## 2016-10-13 ENCOUNTER — Other Ambulatory Visit: Payer: Self-pay | Admitting: Pain Medicine

## 2016-10-13 DIAGNOSIS — M545 Low back pain: Secondary | ICD-10-CM

## 2016-10-19 ENCOUNTER — Ambulatory Visit
Admission: RE | Admit: 2016-10-19 | Discharge: 2016-10-19 | Disposition: A | Discharge status: Home / Self Care | Payer: Federal, State, Local not specified - PPO | Source: Ambulatory Visit | Attending: Pain Medicine | Admitting: Pain Medicine

## 2016-10-19 ENCOUNTER — Other Ambulatory Visit: Payer: Self-pay

## 2016-10-19 DIAGNOSIS — M545 Low back pain: Secondary | ICD-10-CM

## 2017-03-13 ENCOUNTER — Encounter: Payer: Self-pay | Admitting: Internal Medicine

## 2017-03-13 ENCOUNTER — Ambulatory Visit: Payer: Federal, State, Local not specified - PPO | Admitting: Internal Medicine

## 2017-03-13 VITALS — BP 122/80 | HR 89 | Temp 97.9°F | Wt 161.0 lb

## 2017-03-13 DIAGNOSIS — G44209 Tension-type headache, unspecified, not intractable: Secondary | ICD-10-CM

## 2017-03-13 MED ORDER — KETOROLAC TROMETHAMINE 30 MG/ML IJ SOLN
30.0000 mg | Freq: Once | INTRAMUSCULAR | Status: AC
  Administered 2017-03-13: 30 mg via INTRAMUSCULAR

## 2017-03-13 NOTE — Patient Instructions (Signed)
Tension Headache A tension headache is pain, pressure, or aching that is felt over the front and sides of your head. These headaches can last from 30 minutes to several days. Follow these instructions at home: Managing pain  Take over-the-counter and prescription medicines only as told by your doctor.  Lie down in a dark, quiet room when you have a headache.  If directed, apply ice to your head and neck area: ? Put ice in a plastic bag. ? Place a towel between your skin and the bag. ? Leave the ice on for 20 minutes, 2-3 times per day.  Use a heating pad or a hot shower to apply heat to your head and neck area as told by your doctor. Eating and drinking  Eat meals on a regular schedule.  Do not drink a lot of alcohol.  Do not use a lot of caffeine, or stop using caffeine. General instructions  Keep all follow-up visits as told by your doctor. This is important.  Keep a journal to find out if certain things bring on headaches. For example, write down: ? What you eat and drink. ? How much sleep you get. ? Any change to your diet or medicines.  Try getting a massage, or doing other things that help you to relax.  Lessen stress.  Sit up straight. Do not tighten (tense) your muscles.  Do not use tobacco products. This includes cigarettes, chewing tobacco, or e-cigarettes. If you need help quitting, ask your doctor.  Exercise regularly as told by your doctor.  Get enough sleep. This may mean 7-9 hours of sleep. Contact a doctor if:  Your symptoms are not helped by medicine.  You have a headache that feels different from your usual headache.  You feel sick to your stomach (nauseous) or you throw up (vomit).  You have a fever. Get help right away if:  Your headache becomes very bad.  You keep throwing up.  You have a stiff neck.  You have trouble seeing.  You have trouble speaking.  You have pain in your eye or ear.  Your muscles are weak or you lose muscle  control.  You lose your balance or you have trouble walking.  You feel like you will pass out (faint) or you pass out.  You have confusion. This information is not intended to replace advice given to you by your health care provider. Make sure you discuss any questions you have with your health care provider. Document Released: 05/03/2009 Document Revised: 10/07/2015 Document Reviewed: 06/01/2014 Elsevier Interactive Patient Education  2018 Elsevier Inc.  

## 2017-03-13 NOTE — Progress Notes (Signed)
Subjective:    Patient ID: April Stephenson, female    DOB: May 15, 1960, 57 y.o.   MRN: [id number]  HPI  Pt presents to the clinic today with c/o a headache. She reports this started 3 days ago. The pain is located in her temples, at the top of her head. The pain radiates into her neck and shoulders. She describes the pain as tight and achy. She denies dizziness or visual changes. She denies sensitivity to light or sound. She denies nausea or vomiting. She denies sinus symptoms. She denies any injury to the head. She has taken Norco and Robaxin with minimal relief. She has a history of migraines but reports this feels different.  Review of Systems      Past Medical History:  Diagnosis Date  . Arthritis    with longstanding foot and back pain after injuries, followed by Dr. Sharol Given  . Cancer (HCC)    cervical cancer  . Depression    per Dr. Silvio Pate with psych, h/o suicide attempt at 57 y/o  . GERD (gastroesophageal reflux disease)   . Impingement syndrome of left shoulder 06/16/2011  . Migraine with aura   . Suicidal overdose ([place])     Current Outpatient Medications  Medication Sig Dispense Refill  . busPIRone (BUSPAR) 10 MG tablet Take 5 mg by mouth 3 (three) times daily as needed.    . cyclobenzaprine (FLEXERIL) 10 MG tablet Take 10 mg by mouth 2 (two) times daily as needed for muscle spasms.    Marland Kitchen escitalopram (LEXAPRO) 10 MG tablet Take 5 mg by mouth daily.    Marland Kitchen gabapentin (NEURONTIN) 400 MG capsule Take 400 mg by mouth at bedtime.    Marland Kitchen HYDROcodone-acetaminophen (NORCO) 10-325 MG per tablet Take 1 tablet by mouth 3 (three) times daily.    Marland Kitchen lisinopril (PRINIVIL,ZESTRIL) 10 MG tablet Take 10 mg by mouth daily.    . ranitidine (ZANTAC) 150 MG tablet Take 150 mg by mouth 2 (two) times daily as needed (acid reflux).    Marland Kitchen VITAMIN E PO Take 500 mg by mouth daily.    Marland Kitchen zolpidem (AMBIEN) 10 MG tablet Take 1 tablet (10 mg total) by mouth at bedtime. 6 tablet 0   No current  facility-administered medications for this visit.     Allergies  Allergen Reactions  . Diclofenac Sodium Other (See Comments)    Nausea Per pt can tolerate toradol IM fine  . Venlafaxine Other (See Comments)    syncope  . Tramadol Other (See Comments)    Dizzy, doesn't work for patient    Family History  Problem Relation Age of Onset  . Cancer Mother   . Depression Mother   . Cancer Father   . Heart disease Father     Social History   Socioeconomic History  . Marital status: Divorced    Spouse name: Not on file  . Number of children: Not on file  . Years of education: Not on file  . Highest education level: Not on file  Social Needs  . Financial resource strain: Not on file  . Food insecurity - worry: Not on file  . Food insecurity - inability: Not on file  . Transportation needs - medical: Not on file  . Transportation needs - non-medical: Not on file  Occupational History  . Not on file  Tobacco Use  . Smoking status: Never Smoker  . Smokeless tobacco: Never Used  Substance and Sexual Activity  . Alcohol use: Yes  Alcohol/week: 0.0 oz    Comment: occassionally  . Drug use: No  . Sexual activity: No  Other Topics Concern  . Not on file  Social History Narrative   3 kids, 1 with down's, 1 with asperger's   Divorced   Works at [employer], former Counselling psychologist      Constitutional: Pt reports headache. Denies fever, malaise, fatigue, or abrupt weight changes.  HEENT: Denies eye pain, eye redness, ear pain, ringing in the ears, wax buildup, runny nose, nasal congestion, bloody nose, or sore throat. Musculoskeletal: pt reports muscle tension in neck and shoulders. Denies decrease in range of motion, difficulty with gait, or joint pain and swelling.  Neurological: Denies dizziness, difficulty with memory, difficulty with speech or problems with balance and coordination.    No other specific complaints in a complete review of systems (except as listed in HPI  above).  Objective:   Physical Exam  BP 122/80   Pulse 89   Temp 97.9 F (36.6 C) (Oral)   Wt 161 lb (73 kg)   SpO2 98%   BMI 25.22 kg/m  Wt Readings from Last 3 Encounters:  03/13/17 161 lb (73 kg)  10/02/16 165 lb (74.8 kg)  01/27/16 197 lb 4 oz (89.5 kg)    General: Appears her stated age, in NAD. HEENT: Head: normal shape and size, no sinus tenderness noted; Eyes: PERRLA and EOMs intact; Musculoskeletal: Normal flexion, extension and rotation of the cervical spine. No bony tenderness noted over the cervical spine.   Neurological: Alert and oriented. Coordination normal.    BMET    Component Value Date/Time   NA 139 03/12/2014 2038   K 4.6 03/12/2014 2038   CL 103 03/12/2014 2038   CO2 25 03/12/2014 2038   GLUCOSE 105 (H) 03/12/2014 2038   BUN 22 03/12/2014 2038   CREATININE 1.24 (H) 03/12/2014 2038   CALCIUM 8.3 (L) 03/12/2014 2038   GFRNONAA 48 (L) 03/12/2014 2038   GFRAA 56 (L) 03/12/2014 2038    Lipid Panel  No results found for: CHOL, TRIG, HDL, CHOLHDL, VLDL, LDLCALC  CBC    Component Value Date/Time   WBC 16.1 (H) 03/12/2014 2038   RBC 3.80 (L) 03/12/2014 2038   HGB 12.3 03/12/2014 2038   HCT 36.6 03/12/2014 2038   PLT 237 03/12/2014 2038   MCV 96.3 03/12/2014 2038   MCH 32.4 03/12/2014 2038   MCHC 33.6 03/12/2014 2038   RDW 13.5 03/12/2014 2038   LYMPHSABS 4.6 (H) 03/12/2014 2038   MONOABS 1.1 (H) 03/12/2014 2038   EOSABS 0.1 03/12/2014 2038   BASOSABS 0.0 03/12/2014 2038    Hgb A1C No results found for: HGBA1C          Assessment & Plan:   Acute Tension Type Headache, Not Intractable:  Will give 30 mg Toradol IM today Advised her to try stretching, heat and massage She can not take NSAID's I do not think Norco is going to be effective Advised her it is okay to take muscle relaxer as needed  Return/ER precautions discussed Webb Silversmith, NP

## 2017-03-14 ENCOUNTER — Telehealth: Payer: Self-pay | Admitting: Internal Medicine

## 2017-03-14 ENCOUNTER — Telehealth: Payer: Self-pay | Admitting: Family Medicine

## 2017-03-14 MED ORDER — BUTALBITAL-APAP-CAFFEINE 50-325-40 MG PO TABS: 1.0000 | ORAL_TABLET | Freq: Four times a day (QID) | ORAL | 0 refills | Status: DC | PRN

## 2017-03-14 NOTE — Telephone Encounter (Signed)
Copied from [place]. Topic: Quick Communication - See Telephone Encounter >> Mar 14, 2017  2:55 PM Ivar Drape wrote: CRM for notification. See Telephone encounter for:  03/14/17. Please change the patient's preferred pharmacist to:  [id number] - [place], [place] - [phone] (Phone) [phone] (Fax)   [facility] [place].

## 2017-03-14 NOTE — Telephone Encounter (Signed)
Lets try some Fioricet to see if that will help. I will send it in for her.

## 2017-03-14 NOTE — Telephone Encounter (Signed)
Pt seen 03/13/17. Please advise.

## 2017-03-14 NOTE — Telephone Encounter (Signed)
The [facility] was already on pts pharmacy list and I deleted [facility] from the pharmacy list. I notified pt and she said the fioricet was already transferred to [facility] and nothing further needed.

## 2017-03-14 NOTE — Telephone Encounter (Signed)
Copied from [place]. Topic: Quick Communication - See Telephone Encounter >> Mar 14, 2017  8:40 AM Robina Ade, Helene Kelp D wrote: CRM for notification. See Telephone encounter for: 03/14/17. Patient called and would like to let Webb Silversmith know that she still has her headache and wants to know what other options there are for her. Please call patient back, thanks.

## 2017-03-14 NOTE — Telephone Encounter (Signed)
Pt is aware as instructed and expressed understanding 

## 2017-03-14 NOTE — Addendum Note (Signed)
Addended by: Jearld Fenton on: 03/14/2017 12:12 PM   Modules accepted: Orders

## 2017-03-16 ENCOUNTER — Other Ambulatory Visit: Payer: Self-pay

## 2017-03-16 ENCOUNTER — Ambulatory Visit: Payer: Self-pay | Admitting: *Deleted

## 2017-03-16 ENCOUNTER — Encounter (HOSPITAL_COMMUNITY): Payer: Self-pay | Admitting: Emergency Medicine

## 2017-03-16 ENCOUNTER — Emergency Department (HOSPITAL_COMMUNITY)
Admission: EM | Admit: 2017-03-16 | Discharge: 2017-03-16 | Discharge status: Against Medical Advice | Payer: Federal, State, Local not specified - PPO | Attending: Emergency Medicine | Admitting: Emergency Medicine

## 2017-03-16 DIAGNOSIS — K625 Hemorrhage of anus and rectum: Secondary | ICD-10-CM | POA: Insufficient documentation

## 2017-03-16 DIAGNOSIS — Z5321 Procedure and treatment not carried out due to patient leaving prior to being seen by health care provider: Secondary | ICD-10-CM | POA: Insufficient documentation

## 2017-03-16 LAB — CBC
HCT: 44.3 % (ref 36.0–46.0)
HEMOGLOBIN: 15.1 g/dL — AB (ref 12.0–15.0)
MCH: 32.5 pg (ref 26.0–34.0)
MCHC: 34.1 g/dL (ref 30.0–36.0)
MCV: 95.3 fL (ref 78.0–100.0)
Platelets: 222 10*3/uL (ref 150–400)
RBC: 4.65 MIL/uL (ref 3.87–5.11)
RDW: 13.1 % (ref 11.5–15.5)
WBC: 17.4 10*3/uL — ABNORMAL HIGH (ref 4.0–10.5)

## 2017-03-16 LAB — COMPREHENSIVE METABOLIC PANEL
ALT: 18 U/L (ref 14–54)
ANION GAP: 12 (ref 5–15)
AST: 16 U/L (ref 15–41)
Albumin: 4.1 g/dL (ref 3.5–5.0)
Alkaline Phosphatase: 87 U/L (ref 38–126)
BILIRUBIN TOTAL: 0.7 mg/dL (ref 0.3–1.2)
BUN: 19 mg/dL (ref 6–20)
CO2: 21 mmol/L — ABNORMAL LOW (ref 22–32)
Calcium: 9 mg/dL (ref 8.9–10.3)
Chloride: 106 mmol/L (ref 101–111)
Creatinine, Ser: 1.07 mg/dL — ABNORMAL HIGH (ref 0.44–1.00)
GFR calc non Af Amer: 57 mL/min — ABNORMAL LOW (ref >60)
Glucose, Bld: 125 mg/dL — ABNORMAL HIGH (ref 65–99)
Potassium: 3.6 mmol/L (ref 3.5–5.1)
Sodium: 139 mmol/L (ref 135–145)
TOTAL PROTEIN: 7.2 g/dL (ref 6.5–8.1)

## 2017-03-16 LAB — TYPE AND SCREEN
ABO/RH(D): A POS
ANTIBODY SCREEN: NEGATIVE

## 2017-03-16 LAB — ABO/RH: ABO/RH(D): A POS

## 2017-03-16 NOTE — Telephone Encounter (Signed)
Agree with ER.  Thanks. Will await notes.

## 2017-03-16 NOTE — Telephone Encounter (Signed)
Pt reports "passed out" early am 3-4 am while having BM. States no LOC but "couldn't move, out of it." Pt doesn't recall if she was straining, states stool was "a bit hard."  Now with severe abdominal pain "cramping" constant, 10/10 and lower back pain. "Doubled over." Reports bright red bleeding from rectum few minutes prior to triage. "Covers toilet paper." No stool present. Rectal area, anus non-tender. States she "knows nothing tore". No visible hemorrhoids. Pt sent to ED. Husband will drive. Reason for Disposition . Black or tarry bowel movements  (Exception: chronic-unchanged  black-grey bowel movements AND is taking iron pills or Pepto-bismol) . [1] SEVERE pain (e.g., excruciating) AND [2] present > 1 hour  Answer Assessment - Initial Assessment Questions 1. LOCATION: "Where does it hurt?"      Lower stomach 2. RADIATION: "Does the pain shoot anywhere else?" (e.g., chest, back)     Lower pain 3. ONSET: "When did the pain begin?" (e.g., minutes, hours or days ago)      3am this morning 4. SUDDEN: "Gradual or sudden onset?"     Sudden with BM this am 5. PATTERN "Does the pain come and go, or is it constant?"    - If constant: "Is it getting better, staying the same, or worsening?"      (Note: Constant means the pain never goes away completely; most serious pain is constant and it progresses)     - If intermittent: "How long does it last?" "Do you have pain now?"     (Note: Intermittent means the pain goes away completely between bouts)     Constant cramping 6. SEVERITY: "How bad is the pain?"  (e.g., Scale 1-10; mild, moderate, or severe)   - MILD (1-3): doesn't interfere with normal activities, abdomen soft and not tender to touch    - MODERATE (4-7): interferes with normal activities or awakens from sleep, tender to touch    - SEVERE (8-10): excruciating pain, doubled over, unable to do any normal activities      Severe, doubled over 7. RECURRENT SYMPTOM: "Have you ever had this type  of abdominal pain before?" If so, ask: "When was the last time?" and "What happened that time?"      no 8. CAUSE: "What do you think is causing the abdominal pain?"     Not sure 9. RELIEVING/AGGRAVATING FACTORS: "What makes it better or worse?" (e.g., movement, antacids, bowel movement)     Nothing 10. OTHER SYMPTOMS: "Has there been any vomiting, diarrhea, constipation, or urine problems?"       Rectal bleeding, "covered toilet paper" States "passed out" this AM with BM, No LOC "but out of it." Nausea 11. PREGNANCY: "Is there any chance you are pregnant?" "When was your last menstrual period?"       no  Protocols used: ABDOMINAL PAIN - [facility]

## 2017-03-16 NOTE — Telephone Encounter (Signed)
I spoke with pt and she plans to go to [facility] ED now. Pt was not sure since pain going and coming if should go; advised pt if she passed out and still having pain should go to ED; pt still has bleeding but not as heavy. Pt said she would go to ED now.FYI to Dr Damita Dunnings.

## 2017-03-16 NOTE — ED Triage Notes (Signed)
Patients presents to the ED with c/o rectal bleeding. She reports that it happened 6 times since yesterday

## 2017-03-16 NOTE — ED Notes (Signed)
Pt states that she is leaving due to wait times  

## 2017-03-18 ENCOUNTER — Telehealth: Payer: Self-pay | Admitting: Family Medicine

## 2017-03-18 NOTE — Telephone Encounter (Signed)
ER visit, left prior to eval, please check on patient.  Thanks.

## 2017-03-19 ENCOUNTER — Emergency Department (HOSPITAL_COMMUNITY)
Admission: EM | Admit: 2017-03-19 | Discharge: 2017-03-19 | Discharge status: Against Medical Advice | Payer: Federal, State, Local not specified - PPO

## 2017-03-19 ENCOUNTER — Encounter: Payer: Self-pay | Admitting: Family Medicine

## 2017-03-19 ENCOUNTER — Telehealth: Payer: Self-pay

## 2017-03-19 ENCOUNTER — Ambulatory Visit: Payer: Federal, State, Local not specified - PPO | Admitting: Family Medicine

## 2017-03-19 DIAGNOSIS — R109 Unspecified abdominal pain: Secondary | ICD-10-CM | POA: Insufficient documentation

## 2017-03-19 NOTE — Progress Notes (Signed)
Bloody BMs started 03/16/17.  She passed out during a BM on 03/16/17, ~4AM. Woke up on the floor, unclear duration of LOC.  Later that day, was advised ER by triage.  Had lightheaded feeling and abd pain and nausea.  To ER but wait was too long and she couldn't sit due to rectal pain.  Hasn't seen anyone in the meantime. Came in for OV today.    Still with rectal pressure, but not passing blood at time of OV.  She doesn't feel presyncopal now.  Can still get vertigo sx with room spinning.    Had a colonoscopy 5-6 years ago.   No CP now but had some CP on 03/16/17.   Not SOB.   Last passed some BRBPR earlier today.  No black stools.    She reports LOC >1 times on the initial day of sx (she recalls 3 times), but not today or yesterday.    Meds, vitals, and allergies reviewed.   ROS: Per HPI unless specifically indicated in ROS section   nad but uncomfortable  Mmm Neck supple rrr ctab Abd soft, ttp diffusely but more in LLQ w/o rebound, soft BS noted Ext w/o edema.

## 2017-03-19 NOTE — Telephone Encounter (Signed)
Thanks

## 2017-03-19 NOTE — Assessment & Plan Note (Addendum)
Diffuse abd pain but esp in LLQ with recent syncope and prev WBC 17.  D/w pt.  Concern for diverticulitis.  She has a driver.  To ER at [facility] via car per patient preference.  Would be AMA to go home.  W/u at clinic/outpatient tx would be insufficient at this point.  We'll call ahead.    Addendum- staff called ahead re: pending arrival.

## 2017-03-19 NOTE — Telephone Encounter (Signed)
PLEASE NOTE: All timestamps contained within this report are represented as [place] Standard Time. CONFIDENTIALTY NOTICE: This fax transmission is intended only for the addressee. It contains information that is legally privileged, confidential or otherwise protected from use or disclosure. If you are not the intended recipient, you are strictly prohibited from reviewing, disclosing, copying using or disseminating any of this information or taking any action in reliance on or regarding this information. If you have received this fax in error, please notify us immediately by telephone so that we can arrange for its return to [place]. Phone: [phone], Toll-Free: [phone], Fax: [phone] Page: 1 of 2 Call Id: [id number] [place] Patient Name: April Stephenson Gender: Female DOB: 07/11/1960 Age: 57 Y 2 M 2 D Return Phone Number: [phone] (Primary), [phone] (Secondary) Address[place] [address] Client [place] Night - Client Client Site [place] Physician Renford Dills - MD Contact Type Call Who Is Calling Patient / Member / Family / Caregiver Call Type Triage / Clinical Relationship To Patient Self Return Phone Number [phone] (Primary) Chief Complaint Rectal Bleeding Reason for Call Symptomatic / Request for [place] states since 3 am on Friday, she has bright red rectal bleeding, cramps, nausea, was seen by the nurse in the ER last night, was told she has a fever and higher blood count. She still hs rectal bleedign today. Translation No Nurse Assessment Nurse: Luther Parody, RN, Malachy Mood Date/Time (Eastern Time): 03/17/2017 12:01:17 PM Confirm and document reason for call. If symptomatic, describe symptoms. ---Caller states that she was seen in the ed last night for bright  red rectal bleeding with dizziness, nausea and abd cramping. States that she had labs drawn while waiting in the hall and was told she had elevated wbc and fever. States that she waited several hours without being seen and left because she was uncomfortable. Does the patient have any new or worsening symptoms? ---Yes Will a triage be completed? ---Yes Related visit to physician within the last 2 weeks? ---No Does the PT have any chronic conditions? (i.e. diabetes, asthma, etc.) ---Yes List chronic conditions. ---chronic back pain, htn Is this a behavioral health or substance abuse call? ---No Guidelines Guideline Title Affirmed Question Affirmed Notes Nurse Date/Time (Eastern Time) Rectal Bleeding SEVERE rectal bleeding (large blood clots; on and off, or constant bleeding) Luther Parody, RN, Malachy Mood 03/17/2017 12:03:11 PM Disp. Time Eilene Ghazi Time) Disposition Final User 03/17/2017 12:12:46 PM Go to ED Now Yes Luther Parody, RN, Malachy Mood PLEASE NOTE: All timestamps contained within this report are represented as [place] Standard Time. CONFIDENTIALTY NOTICE: This fax transmission is intended only for the addressee. It contains information that is legally privileged, confidential or otherwise protected from use or disclosure. If you are not the intended recipient, you are strictly prohibited from reviewing, disclosing, copying using or disseminating any of this information or taking any action in reliance on or regarding this information. If you have received this fax in error, please notify us immediately by telephone so that we can arrange for its return to [place]. Phone: [phone], Toll-Free: [phone], Fax: [phone] Page: 2 of 2 Call Id: [id number] [place] Disagree/Comply Comply Caller Understands Yes PreDisposition Did not know what to do Care Advice Given Per Guideline GO TO ED NOW: You need to be seen in the Emergency Department. Go to the ER at ___________ [place] now. Drive carefully.  DRIVING: Another  adult should drive. Do not delay going to the Emergency Department. If immediate transportation is not available via car or taxi, then the patient should be instructed to call [id number]. CARE ADVICE given per Rectal Bleeding (Adult) guideline. Referrals [facility] - ED

## 2017-03-19 NOTE — Telephone Encounter (Signed)
Patient has appointment scheduled this afternoon.

## 2017-03-19 NOTE — ED Notes (Signed)
Patient called for a triage room with no answer.

## 2017-03-19 NOTE — ED Triage Notes (Signed)
Patient called x1 for triage. No answer

## 2017-03-19 NOTE — Patient Instructions (Signed)
Go to the ER at [facility].  I'll call ahead.

## 2017-03-19 NOTE — ED Notes (Signed)
PT has been called for triage x3 with no response from the lobby.

## 2017-03-19 NOTE — Telephone Encounter (Signed)
I spoke with pt; rectal bleeding is a lot less; has stomach ache;pt said more abd pain; pain level 7. No nausea now. Pt offered sooner appt today but pt preferred to see Dr Damita Dunnings. Pt scheduled appt with Dr Damita Dunnings 03/19/17 at 2:30. If pt condition worsens prior to appt pt will go to ED.

## 2017-03-20 ENCOUNTER — Telehealth: Payer: Self-pay | Admitting: Family Medicine

## 2017-03-20 NOTE — Telephone Encounter (Signed)
Patient says she did go to ER but couldn't sit for so long waiting to be evaluated.  The pressure on her rectum was excruciating.  I explained that I had called ahead to the ER but patient said that didn't seem to make a difference.  Patient states she is a little better today and has decided that if she gets worse again, she will go in by ambulance.

## 2017-03-20 NOTE — Telephone Encounter (Signed)
I would still go to the ER given the bleeding, syncope, pain, etc.  Thanks.

## 2017-03-20 NOTE — Telephone Encounter (Signed)
Patient states she has been to ER twice and could not stand to sit and wait.  She is some better now and if she gets worse, she will go in by ambulance.

## 2017-03-20 NOTE — Telephone Encounter (Signed)
Call pt.  I don't seen ER notes.  Unclear if she went to ER.  Advise ER.  Thanks.

## 2017-03-20 NOTE — Telephone Encounter (Signed)
For charting purposes, this is against medical advice, as discussed yesterday at the [facility].

## 2017-03-23 DIAGNOSIS — I10 Essential (primary) hypertension: Secondary | ICD-10-CM | POA: Insufficient documentation

## 2017-03-23 DIAGNOSIS — R198 Other specified symptoms and signs involving the digestive system and abdomen: Secondary | ICD-10-CM | POA: Insufficient documentation

## 2017-03-23 DIAGNOSIS — G8921 Chronic pain due to trauma: Secondary | ICD-10-CM | POA: Insufficient documentation

## 2017-03-24 LAB — HM COLONOSCOPY

## 2017-03-26 MED ORDER — HYDROCODONE-ACETAMINOPHEN 5-325 MG PO TABS
ORAL_TABLET | ORAL | Status: DC
Start: ? — End: 2017-03-26

## 2017-03-26 MED ORDER — ZOLPIDEM TARTRATE 5 MG PO TABS
5.00 | ORAL_TABLET | ORAL | Status: DC
Start: ? — End: 2017-03-26

## 2017-03-26 MED ORDER — LISINOPRIL 10 MG PO TABS
10.00 | ORAL_TABLET | ORAL | Status: DC
Start: 2017-03-25 — End: 2017-03-26

## 2017-03-26 MED ORDER — HYDRALAZINE HCL 20 MG/ML IJ SOLN
10.00 | INTRAMUSCULAR | Status: DC
Start: ? — End: 2017-03-26

## 2017-03-26 MED ORDER — ANTACID & ANTIGAS 200-200-20 MG/5ML PO SUSP
30.00 | ORAL | Status: DC
Start: ? — End: 2017-03-26

## 2017-03-26 MED ORDER — SODIUM CHLORIDE 0.9 % IV SOLN
INTRAVENOUS | Status: DC
Start: ? — End: 2017-03-26

## 2017-03-26 MED ORDER — GENERIC EXTERNAL MEDICATION
Status: DC
Start: ? — End: 2017-03-26

## 2017-03-26 MED ORDER — NITROGLYCERIN 0.4 MG SL SUBL
0.40 | SUBLINGUAL_TABLET | SUBLINGUAL | Status: DC
Start: ? — End: 2017-03-26

## 2017-03-26 MED ORDER — HYDROCODONE-ACETAMINOPHEN 10-325 MG PO TABS
ORAL_TABLET | ORAL | Status: DC
Start: ? — End: 2017-03-26

## 2017-03-28 ENCOUNTER — Telehealth: Payer: Self-pay | Admitting: Family Medicine

## 2017-03-28 ENCOUNTER — Encounter: Payer: Self-pay | Admitting: Family Medicine

## 2017-03-28 NOTE — Telephone Encounter (Signed)
Inpatient notes from care everywhere- pasted into chart.  Med hx updated.    Lower abdominal pain - unexplained by CT or colonoscopy. Portal vein thrombosis documented as above and will start on Eliquis. Will simply observe the effect on the patient's abdominal pain.  Rectal fullness - Colonoscopy 03/24/2017 negative. Findings: Erythema in sigmoid colon of unclear clinical significance, biopsied. Normal retroflexion. Otherwise normal colonoscopy.  Colon, Sigmoid, Mucosal Biopsy: -  Colonic mucosa, within normal limits.  Hypertension - controlled on current Rx.  The patient was discharged home with persistent LLQ pain, but it was reported as progressively "better". The plan is to observe on treatment for the discovered portal vein thrombosis. Hypercoagulable panel is pending. Discharge medications are as detailed above. Patient will follow up with PCP and specialists. ================================ Patient has f/u pending.  Please enter colonoscopy in EMR.  See date above.  Thanks.

## 2017-03-28 NOTE — Telephone Encounter (Signed)
Health Maintenance updated as instructed.

## 2017-03-30 ENCOUNTER — Encounter: Payer: Self-pay | Admitting: Family Medicine

## 2017-03-30 ENCOUNTER — Ambulatory Visit (INDEPENDENT_AMBULATORY_CARE_PROVIDER_SITE_OTHER): Payer: Medicare Other | Admitting: Family Medicine

## 2017-03-30 VITALS — BP 118/78 | HR 101 | Temp 98.5°F | Wt 154.2 lb

## 2017-03-30 DIAGNOSIS — I81 Portal vein thrombosis: Secondary | ICD-10-CM | POA: Diagnosis not present

## 2017-03-30 DIAGNOSIS — R55 Syncope and collapse: Secondary | ICD-10-CM | POA: Diagnosis not present

## 2017-03-30 LAB — CBC WITH DIFFERENTIAL/PLATELET
BASOS PCT: 0.3 %
Basophils Absolute: 35 cells/uL (ref 0–200)
EOS ABS: 35 {cells}/uL (ref 15–500)
Eosinophils Relative: 0.3 %
HCT: 40.4 % (ref 35.0–45.0)
HEMOGLOBIN: 14.6 g/dL (ref 11.7–15.5)
LYMPHS ABS: 2457 {cells}/uL (ref 850–3900)
MCH: 33 pg (ref 27.0–33.0)
MCHC: 36.1 g/dL — ABNORMAL HIGH (ref 32.0–36.0)
MCV: 91.2 fL (ref 80.0–100.0)
MONOS PCT: 5.2 %
MPV: 10.5 fL (ref 7.5–12.5)
NEUTROS ABS: 8564 {cells}/uL — AB (ref 1500–7800)
Neutrophils Relative %: 73.2 %
Platelets: 310 10*3/uL (ref 140–400)
RBC: 4.43 10*6/uL (ref 3.80–5.10)
RDW: 13.2 % (ref 11.0–15.0)
Total Lymphocyte: 21 %
WBC mixed population: 608 cells/uL (ref 200–950)
WBC: 11.7 10*3/uL — ABNORMAL HIGH (ref 3.8–10.8)

## 2017-03-30 LAB — COMPREHENSIVE METABOLIC PANEL
ALT: 13 U/L (ref 0–35)
AST: 7 U/L (ref 0–37)
Albumin: 4.4 g/dL (ref 3.5–5.2)
Alkaline Phosphatase: 65 U/L (ref 39–117)
BILIRUBIN TOTAL: 0.3 mg/dL (ref 0.2–1.2)
BUN: 19 mg/dL (ref 6–23)
CALCIUM: 9.3 mg/dL (ref 8.4–10.5)
CHLORIDE: 105 meq/L (ref 96–112)
CO2: 29 meq/L (ref 19–32)
Creatinine, Ser: 0.91 mg/dL (ref 0.40–1.20)
GFR: 67.67 mL/min (ref >60.00)
Glucose, Bld: 116 mg/dL — ABNORMAL HIGH (ref 70–99)
POTASSIUM: 4.1 meq/L (ref 3.5–5.1)
Sodium: 139 mEq/L (ref 135–145)
Total Protein: 7.6 g/dL (ref 6.0–8.3)

## 2017-03-30 MED ORDER — APIXABAN 5 MG PO TABS: 5.0000 mg | ORAL_TABLET | Freq: Two times a day (BID) | ORAL | 4 refills | Status: DC

## 2017-03-30 NOTE — Progress Notes (Signed)
Hospital follow-up.  Outpatient records reviewed.  Discussed with patient about results, pathophysiology and plans.   ============================================ Lower abdominal pain - unexplained by CT or colonoscopy. Portal vein thrombosis documented as above and will start on Eliquis. Will simply observe the effect on the patient's abdominal pain.  Rectal fullness - Colonoscopy 03/24/2017 negative. Findings: Erythema in sigmoid colon of unclear clinical significance, biopsied. Normal retroflexion. Otherwise normal colonoscopy.  Colon, Sigmoid, Mucosal Biopsy: -  Colonic mucosa, within normal limits.  Hypertension - controlled on current Rx.  The patient was discharged home with persistent LLQ pain, but it was reported as progressively "better". The plan is to observe on treatment for the discovered portal vein thrombosis. Hypercoagulable panel is pending. Discharge medications are as detailed above. Patient will follow up with PCP and specialists. ======================================= She previously had abdominal pain and syncope.  She ended up getting admitted to outside hospital.  She was found to have incidental portal vein thrombosis.  She was started on anticoagulation.  Colonoscopy done with the results listed above.  She was improving, had less abdominal pain, and was discharged home. She is temporarily on 2 tabs of eliquis BID then will change to 1 tab BID tomorrow.    D/w pt about robaxin, she wasn't sure about the dose but can update [place] as needed.    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  No FH clotting disorder.  Not SOB, no exertional CP.  Now with 1/10 nonbothersome LLQ pain.  This is clearly better than previous.  She does have history of syncope noted but none since the hospitalization.  PMH and SH reviewed  ROS: Per HPI unless specifically indicated in ROS section   Meds, vitals, and allergies reviewed.   GEN: nad, alert and oriented HEENT: mucous membranes moist NECK:  supple w/o LA CV: rrr PULM: ctab, no inc wob ABD: soft, +bs, minimally tender in the left lower quadrant without any rebound.  This is clearly improved from previous. EXT: no edema SKIN: no acute rash

## 2017-03-30 NOTE — Patient Instructions (Signed)
I sent more eliquis to keep you from running out.  Rosaria Ferries will call about your referral. If you have bleeding or feel worse then let us know.   Go to the lab on the way out.  We'll contact you with your lab report. Take care.  Glad to see you.

## 2017-04-01 DIAGNOSIS — I81 Portal vein thrombosis: Secondary | ICD-10-CM | POA: Insufficient documentation

## 2017-04-01 DIAGNOSIS — R55 Syncope and collapse: Secondary | ICD-10-CM | POA: Insufficient documentation

## 2017-04-01 NOTE — Assessment & Plan Note (Signed)
No clear source for this event.  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  No FH clotting disorder.  Not SOB, no exertional CP.  Now with 1/10 nonbothersome LLQ pain.  This is clearly better than previous.  Would continue with anticoagulation as previously prescribed.  No adverse effect of medication.  No bleeding.  Pathophysiology of clot discussed with patient.  Recheck routine labs today.  Referred to both GI and hematology because of the portal vein thrombosis.  She has minimal left lower quadrant pain right now.  She has colonoscopy findings noted above.  At this point she does appear okay for outpatient follow-up.  All questions answered. >25 minutes spent in face to face time with patient, >50% spent in counselling or coordination of care.   Outside results discussed with patient. According to outside results her lupus anticoagulant was negative.  Her anticardiolipin antibody, IgG, was indeterminant at 15.  Anticardiolipin was negative.  Factor V Leiden was negative.  She was negative for prothrombin mutation.  I do not see protein C or protein S testing done.  I will defer this to hematology.  It may be that she does not need testing yet for those, in the subacute setting of clot.

## 2017-04-01 NOTE — Assessment & Plan Note (Signed)
Not discussed at the office visit but we should likely have the patient see cardiology.  I put in the referral.  Will notify patient.

## 2017-04-11 ENCOUNTER — Inpatient Hospital Stay: Payer: Medicare Other

## 2017-04-11 ENCOUNTER — Encounter: Payer: Self-pay | Admitting: Internal Medicine

## 2017-04-11 ENCOUNTER — Inpatient Hospital Stay: Payer: Medicare Other | Attending: Internal Medicine | Admitting: Internal Medicine

## 2017-04-11 ENCOUNTER — Other Ambulatory Visit: Payer: Self-pay

## 2017-04-11 VITALS — BP 114/80 | HR 98 | Temp 98.7°F | Resp 18 | Ht 68.11 in | Wt 154.2 lb

## 2017-04-11 DIAGNOSIS — I81 Portal vein thrombosis: Secondary | ICD-10-CM

## 2017-04-11 DIAGNOSIS — N96 Recurrent pregnancy loss: Secondary | ICD-10-CM | POA: Diagnosis not present

## 2017-04-11 DIAGNOSIS — Z90710 Acquired absence of both cervix and uterus: Secondary | ICD-10-CM | POA: Diagnosis not present

## 2017-04-11 DIAGNOSIS — Z8541 Personal history of malignant neoplasm of cervix uteri: Secondary | ICD-10-CM

## 2017-04-11 DIAGNOSIS — Z7901 Long term (current) use of anticoagulants: Secondary | ICD-10-CM

## 2017-04-11 DIAGNOSIS — D6859 Other primary thrombophilia: Secondary | ICD-10-CM | POA: Diagnosis not present

## 2017-04-11 LAB — CBC WITH DIFFERENTIAL/PLATELET
Basophils Absolute: 0.1 10*3/uL (ref 0–0.1)
Basophils Relative: 1 %
EOS ABS: 0.1 10*3/uL (ref 0–0.7)
EOS PCT: 1 %
HCT: 46.1 % (ref 35.0–47.0)
Hemoglobin: 15.6 g/dL (ref 12.0–16.0)
Lymphocytes Relative: 39 %
Lymphs Abs: 3.7 10*3/uL — ABNORMAL HIGH (ref 1.0–3.6)
MCH: 33 pg (ref 26.0–34.0)
MCHC: 33.9 g/dL (ref 32.0–36.0)
MCV: 97.4 fL (ref 80.0–100.0)
MONOS PCT: 8 %
Monocytes Absolute: 0.7 10*3/uL (ref 0.2–0.9)
Neutro Abs: 4.9 10*3/uL (ref 1.4–6.5)
Neutrophils Relative %: 51 %
PLATELETS: 266 10*3/uL (ref 150–440)
RBC: 4.74 MIL/uL (ref 3.80–5.20)
RDW: 13.7 % (ref 11.5–14.5)
WBC: 9.5 10*3/uL (ref 3.6–11.0)

## 2017-04-11 NOTE — Progress Notes (Signed)
Here for new pt evaluation.  

## 2017-04-11 NOTE — Assessment & Plan Note (Addendum)
#  Portal vein thrombosis-very symptomatic with significant abdominal pain at presentation.  Currently resolved.  Continue Eliquis would recommend total of 6 months of anticoagulation; unless any hypercoagulable cause noted.  # Hypercoagulable state-unclear etiology to explain patient's portal vein thrombosis.  Recommend PNH workup/Jak 2 mutation.   #Given the prior history of miscarriages x2; indeterminate IgM-antiphospholipid [unlikely]-recommend repeating antiphospholipid antibody panel after stopping anticoagulation.  # follow up in 69months/cbc; labs today.   # Thank you Dr.Duncan for allowing me to participate in the care of your pleasant patient. Please do not hesitate to contact me with questions or concerns in the interim.  Cc; Dr.Duncan

## 2017-04-11 NOTE — Progress Notes (Signed)
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  EGD/Colo-NEG []  # cervical cancer [screening Pap smears early-stage] status post hysterectomy; BSO.  No chemoradiation.     Cervical cancer (HCC)     HISTORY OF PRESENTING ILLNESS:  April Stephenson 57 y.o.  female with remote history of cervical cancer stage I; was recently evaluated at [facility] emergency room for abdominal pain; diagnosed with portal vein thrombosis.  Patient states that she woke up approximately 4:00 in the morning with severe abdominal pain; had a syncopal episode.  Also noted to have blood in stools.  Eventually patient was admitted to the hospital at [facility] workup with a CT scan showed left hepatic lobe hypodensity/thrombosis of the left portal vein.  Otherwise no other vascular abnormalities noted; no other etiology of the abdominal pain was found.  The patient also had a EGD colonoscopy that was negative.  She was started on anticoagulation she is currently on Eliquis.  Patient seems to be tolerating Eliquis fairly well.  No blood in stools black colored stools.  She notes to have improvement of the abdominal pain.  Patient did not have any prior blood clots.  No family history of blood clots.  She never smoked.  No history of any cirrhosis.  She did have a history of miscarriages x2 in 8-12 weeks.  ROS: A complete 10 point review of system is done which is negative  except mentioned above in history of present illness  MEDICAL HISTORY:  Past Medical History:  Diagnosis Date  . Arthritis    with longstanding foot and back pain after injuries, followed by Dr. Sharol Given  . Cancer (HCC)    cervical cancer  . Depression    per Dr. Silvio Pate with psych, h/o suicide attempt at 57 y/o  . Dupuytren's contracture of left hand 2016  . GERD (gastroesophageal reflux disease)   . Impingement syndrome of left shoulder 06/16/2011  . Migraine with aura   . Portal vein thrombosis   . Suicidal overdose ([place])     SURGICAL HISTORY: Past Surgical History:  Procedure Laterality Date  . ABDOMINAL HYSTERECTOMY  2000   TAH/BSO  . APPENDECTOMY    . COLONOSCOPY    . FOOT SURGERY     R foot-great toe  . ORIF RADIAL FRACTURE Bilateral 03/13/2014   Procedure: OPEN REDUCTION INTERNAL FIXATION (ORIF) RADIAL FRACTURE;  Surgeon: Dayna Barker, MD;  Location: [facility] [facility];  Service: Plastics;  Laterality: Bilateral;  . SHOULDER SURGERY     2013  . TONSILLECTOMY      SOCIAL HISTORY: lives in [place]; no smkoig/acpol; 3 chilrsn; wordedk for post ofice Social History   Socioeconomic History  . Marital status: Divorced    Spouse name: Not on file  . Number of children: Not on file  . Years of education: Not on file  . Highest education level: Not on file  Social Needs  . Financial resource strain: Not on file  . Food insecurity - worry: Not on file  . Food insecurity -  inability: Not on file  . Transportation needs - medical: Not on file  . Transportation needs - non-medical: Not on file  Occupational History  . Not on file  Tobacco Use  . Smoking status: Never Smoker  . Smokeless tobacco: Never Used  Substance and Sexual Activity  . Alcohol use: Yes    Alcohol/week: 0.0 oz    Comment: occassionally  . Drug use: No  . Sexual activity: Yes  Other Topics Concern  . Not on file  Social History Narrative   3 kids, 1 with down's, 1 with asperger's   Divorced   Works at  [employer], former Counselling psychologist     FAMILY HISTORY: Family History  Problem Relation Age of Onset  . Depression Mother   . Colon cancer Mother   . Kidney cancer Mother   . Heart disease Father   . Stroke Father   . Depression Brother     ALLERGIES:  is allergic to diclofenac sodium; tramadol; venlafaxine; and voltaren  [diclofenac sodium].  MEDICATIONS:  Current Outpatient Medications  Medication Sig Dispense Refill  . apixaban (ELIQUIS) 5 MG TABS tablet Take 1 tablet (5 mg total) by mouth 2 (two) times daily. 60 tablet 4  . lisinopril (PRINIVIL,ZESTRIL) 10 MG tablet Take 10 mg by mouth daily.    Marland Kitchen VITAMIN E PO Take 500 mg by mouth daily.    Marland Kitchen zolpidem (AMBIEN) 10 MG tablet Take 1 tablet (10 mg total) by mouth at bedtime. 6 tablet 0  . butalbital-acetaminophen-caffeine (FIORICET, ESGIC) 50-325-40 MG tablet Take 1-2 tablets by mouth every 6 (six) hours as needed for headache. (Patient not taking: Reported on 04/11/2017) 20 tablet 0  . diclofenac sodium (VOLTAREN) 1 % GEL     . HYDROcodone-acetaminophen (NORCO) 10-325 MG per tablet Take 1 tablet by mouth 3 (three) times daily.    . methocarbamol (ROBAXIN) 500 MG tablet Take 500 mg by mouth 4 (four) times daily.    . ranitidine (ZANTAC) 150 MG tablet Take 150 mg by mouth 2 (two) times daily as needed (acid reflux).     No current facility-administered medications for this visit.       Marland Kitchen  PHYSICAL EXAMINATION: ECOG PERFORMANCE STATUS: 0 - Asymptomatic  Vitals:   04/11/17 1134  BP: 114/80  Pulse: 98  Resp: 18  Temp: 98.7 F (37.1 C)   Filed Weights   04/11/17 1134  Weight: 154 lb 3.2 oz (69.9 kg)    GENERAL: Well-nourished well-developed; Alert, no distress and comfortable.   Alone.  EYES: no pallor or icterus OROPHARYNX: no thrush or ulceration; good dentition  NECK: supple, no masses felt LYMPH:  no palpable lymphadenopathy in the cervical, axillary or inguinal regions LUNGS: clear to auscultation and  No wheeze or  crackles HEART/CVS: regular rate & rhythm and no murmurs; No lower extremity edema ABDOMEN: abdomen soft, non-tender and normal bowel sounds Musculoskeletal:no cyanosis of digits and no clubbing  PSYCH: alert & oriented x 3 with fluent speech NEURO: no focal motor/sensory deficits SKIN:  no rashes or significant lesions  LABORATORY DATA:  I have reviewed the data as listed Lab Results  Component Value Date   WBC 11.7 (H) 03/30/2017   HGB 14.6 03/30/2017   HCT 40.4 03/30/2017   MCV 91.2 03/30/2017   PLT 310 03/30/2017   Recent Labs    03/16/17 1816 03/30/17 1156  NA 139 139  K 3.6 4.1  CL 106 105  CO2 21* 29  GLUCOSE 125* 116*  BUN 19 19  CREATININE 1.07* 0.91  CALCIUM 9.0 9.3  GFRNONAA 57*  --   GFRAA >60  --   PROT 7.2 7.6  ALBUMIN 4.1 4.4  AST 16 7  ALT 18 13  ALKPHOS 87 65  BILITOT 0.7 0.3    RADIOGRAPHIC STUDIES: I have personally reviewed the radiological images as listed and agreed with the findings in the report. No results found.  ASSESSMENT & PLAN:   Portal vein thrombosis #Portal vein thrombosis-very symptomatic with significant abdominal pain at presentation.  Currently resolved.  Continue Eliquis would recommend total of 6 months of anticoagulation; unless any hypercoagulable cause noted.  # Hypercoagulable state-unclear etiology to explain patient's portal vein thrombosis.  Recommend [facility] workup/Jak 2 mutation.   #Given the prior history of miscarriages x2; indeterminate IgM-antiphospholipid [unlikely]-recommend repeating antiphospholipid antibody panel after stopping anticoagulation.  # follow up in 27months/cbc; labs today.   # Thank you Dr.Duncan for allowing me to participate in the care of your pleasant patient. Please do not hesitate to contact me with questions or concerns in the interim.  Cc; Dr.Duncan  All questions were answered. The patient knows to call the clinic with any problems, questions or concerns.     Cammie Sickle,  MD 04/11/2017 12:11 PM

## 2017-04-16 LAB — PNH PROFILE (-HIGH SENSITIVITY)

## 2017-04-16 LAB — JAK2 GENOTYPR

## 2017-04-24 ENCOUNTER — Encounter: Payer: Self-pay | Admitting: Gastroenterology

## 2017-04-24 ENCOUNTER — Encounter (INDEPENDENT_AMBULATORY_CARE_PROVIDER_SITE_OTHER): Payer: Self-pay

## 2017-04-24 ENCOUNTER — Ambulatory Visit (INDEPENDENT_AMBULATORY_CARE_PROVIDER_SITE_OTHER): Payer: Medicare Other | Admitting: Gastroenterology

## 2017-04-24 VITALS — BP 113/79 | HR 90 | Ht 68.0 in | Wt 162.0 lb

## 2017-04-24 DIAGNOSIS — I81 Portal vein thrombosis: Secondary | ICD-10-CM

## 2017-04-24 NOTE — Progress Notes (Signed)
Gastroenterology Consultation  Referring Provider:     Tonia Ghent, MD Primary Care Physician:  Tonia Ghent, MD Primary Gastroenterologist:  Dr. Allen Norris     Reason for Consultation:     Portal vein thrombosis        HPI:   April Stephenson is a 57 y.o. y/o female referred for consultation & management of Portal vein thrombosis by Dr. Damita Dunnings, Elveria Rising, MD.  This patient comes in today after recently being in an outside hospital and having a CT scan done for abdominal pain and lower GI bleeding.  The patient underwent a colonoscopy and EGD and no source of the bleeding was found.  The patient was found on CT scan to have portal vein thrombosis. The CT scan showed a tubular density in the lateral segment of the left hepatic lobe consistent with a thrombosis involving the left portal vein.  The patient reports that her abdominal pain is throughout her whole abdomen and worse when she moves around.  There is no report of any foods make it worse.  The patient does report that she has lost probably 65 pounds but she has been trying to lose weight.  She states that she has always been skinny and then gained a substantial amount of weight and she states it was easy to come off because of her history of being much thinner.  Past Medical History:  Diagnosis Date  . Arthritis    with longstanding foot and back pain after injuries, followed by Dr. Sharol Given  . Cancer (HCC)    cervical cancer  . Depression    per Dr. Silvio Pate with psych, h/o suicide attempt at 57 y/o  . Dupuytren's contracture of left hand 2016  . GERD (gastroesophageal reflux disease)   . Impingement syndrome of left shoulder 06/16/2011  . Migraine with aura   . Portal vein thrombosis   . Suicidal overdose [facility])     Past Surgical History:  Procedure Laterality Date  . ABDOMINAL HYSTERECTOMY  2000   TAH/BSO  . APPENDECTOMY    . COLONOSCOPY    . FOOT SURGERY     R foot-great toe  . ORIF RADIAL FRACTURE Bilateral 03/13/2014   Procedure: OPEN REDUCTION INTERNAL FIXATION (ORIF) RADIAL FRACTURE;  Surgeon: Dayna Barker, MD;  Location: [place];  Service: Plastics;  Laterality: Bilateral;  . SHOULDER SURGERY     2013  . TONSILLECTOMY      Prior to Admission medications   Medication Sig Start Date End Date Taking? Authorizing Provider  apixaban (ELIQUIS) 5 MG TABS tablet Take 1 tablet (5 mg total) by mouth 2 (two) times daily. 03/30/17  Yes Tonia Ghent, MD  diclofenac sodium (VOLTAREN) 1 % GEL  03/27/16  Yes [provider]  HYDROcodone-acetaminophen (NORCO) 10-325 MG per tablet Take 1 tablet by mouth 3 (three) times daily.   Yes [provider]  lisinopril (PRINIVIL,ZESTRIL) 10 MG tablet Take 10 mg by mouth daily.   Yes [provider]  methocarbamol (ROBAXIN) 500 MG tablet Take 500 mg by mouth 4 (four) times daily.   Yes [provider]  ranitidine (ZANTAC) 150 MG tablet Take 150 mg by mouth 2 (two) times daily as needed (acid reflux).   Yes [provider]  VITAMIN E PO Take 500 mg by mouth daily.   Yes [provider]  zolpidem (AMBIEN) 10 MG tablet Take 1 tablet (10 mg total) by mouth at bedtime. 10/05/16  Yes Tonia Ghent,  MD  butalbital-acetaminophen-caffeine (FIORICET, ESGIC) 50-325-40 MG tablet Take 1-2 tablets by mouth every 6 (six) hours as needed for headache. Patient not taking: Reported on 04/11/2017 03/14/17 03/14/18  Jearld Fenton, NP    Family History  Problem Relation Age of Onset  . Depression Mother   . Colon cancer Mother   . Kidney cancer Mother   . Heart disease Father   . Stroke Father   . Depression Brother      Social History   Tobacco Use  . Smoking status: Never Smoker  . Smokeless tobacco: Never Used  Substance Use Topics  . Alcohol use: Yes    Alcohol/week: 0.0 oz    Comment: occassionally  . Drug use: No    Allergies as of 04/24/2017 - Review Complete 04/24/2017  Allergen Reaction Noted  . Diclofenac sodium Other  (See Comments) 03/28/2011  . Tramadol Other (See Comments) and Nausea And Vomiting 05/15/2012  . Venlafaxine Other (See Comments) 08/12/2010  . Voltaren  [diclofenac sodium] Other (See Comments) 03/23/2017    Review of Systems:    All systems reviewed and negative except where noted in HPI.   Physical Exam:  BP 113/79   Pulse 90   Ht 5\' 8"  (1.727 m)   Wt 162 lb (73.5 kg)   BMI 24.63 kg/m  No LMP recorded. Patient has had a hysterectomy. Psych:  Alert and cooperative. Normal mood and affect. General:   Alert,  Well-developed, well-nourished, pleasant and cooperative in NAD Head:  Normocephalic and atraumatic. Eyes:  Sclera clear, no icterus.   Conjunctiva pink. Ears:  Normal auditory acuity. Nose:  No deformity, discharge, or lesions. Mouth:  No deformity or lesions,oropharynx pink & moist. Neck:  Supple; no masses or thyromegaly. Lungs:  Respirations even and unlabored.  Clear throughout to auscultation.   No wheezes, crackles, or rhonchi. No acute distress. Heart:  Regular rate and rhythm; no murmurs, clicks, rubs, or gallops. Abdomen:  Normal bowel sounds.  No bruits.  Soft, The abdomen was tender to 1 finger palpation while flexing the abdominal wall muscles and non-distended without masses, hepatosplenomegaly or hernias noted.  No guarding or rebound tenderness.  Negative Carnett sign.   Rectal:  Deferred.  Msk:  Symmetrical without gross deformities.  Good, equal movement & strength bilaterally. Pulses:  Normal pulses noted. Extremities:  No clubbing or edema.  No cyanosis. Neurologic:  Alert and oriented x3;  grossly normal neurologically. Skin:  Intact without significant lesions or rashes.  No jaundice. Lymph Nodes:  No significant cervical adenopathy. Psych:  Alert and cooperative. Normal mood and affect.  Imaging Studies: No results found.  Assessment and Plan:   April Stephenson is a 48 y.o. y/o female who has musculoskeletal abdominal pain that is reproducible  with light palpation while flexing the abdominal wall muscles. The patient was also found to have a thrombus in her left hepatic lobe and the portal vein.  The patient has early been started on anticoagulation.  The patient has also been seen by hematology.  The workup for portal vein thrombosis involves hematology and oncology.  The patient has early had an EGD and colonoscopy at an outside facility and that does not need to be repeated.  The patient has been explained the plan and will follow up as needed.  She should continue follow-up with Hematology/oncology. The patient has been explained the plan and agrees with it.  Lucilla Lame, MD. Marval Regal   Note: This dictation was prepared with Dragon dictation along  with smaller phrase technology. Any transcriptional errors that result from this process are unintentional.

## 2017-05-01 ENCOUNTER — Encounter: Payer: Self-pay | Admitting: Cardiovascular Disease

## 2017-05-01 ENCOUNTER — Ambulatory Visit (INDEPENDENT_AMBULATORY_CARE_PROVIDER_SITE_OTHER): Payer: Medicare Other | Admitting: Cardiovascular Disease

## 2017-05-01 ENCOUNTER — Ambulatory Visit (INDEPENDENT_AMBULATORY_CARE_PROVIDER_SITE_OTHER): Payer: Medicare Other

## 2017-05-01 VITALS — BP 107/82 | HR 93 | Ht 67.0 in | Wt 155.5 lb

## 2017-05-01 DIAGNOSIS — R0602 Shortness of breath: Secondary | ICD-10-CM

## 2017-05-01 DIAGNOSIS — I10 Essential (primary) hypertension: Secondary | ICD-10-CM

## 2017-05-01 DIAGNOSIS — R55 Syncope and collapse: Secondary | ICD-10-CM

## 2017-05-01 NOTE — Patient Instructions (Addendum)
Medication Instructions:  Your physician recommends that you continue on your current medications as directed. Please refer to the Current Medication list given to you today.   Labwork: none  Testing/Procedures: Your physician has requested that you have an echocardiogram. Echocardiography is a painless test that uses sound waves to create images of your heart. It provides your doctor with information about the size and shape of your heart and how well your heart's chambers and valves are working. This procedure takes approximately one hour. There are no restrictions for this procedure.  Your physician has recommended that you wear a zio monitor. Zio monitors are medical devices that record the heart's electrical activity. Doctors most often use these monitors to diagnose arrhythmias. Arrhythmias are problems with the speed or rhythm of the heartbeat. The monitor is a small, portable device. You can wear one while you do your normal daily activities. This is usually used to diagnose what is causing palpitations/syncope (passing out).  Zio monitor [id number]  Follow-Up: Your physician wants you to follow-up in: 1 year with Dr. Fletcher Anon. You will receive a reminder letter in the mail two months in advance. If you don't receive a letter, please call our office to schedule the follow-up appointment.   Any Other Special Instructions Will Be Listed Below (If Applicable).     If you need a refill on your cardiac medications before your next appointment, please call your pharmacy.  Echocardiogram An echocardiogram, or echocardiography, uses sound waves (ultrasound) to produce an image of your heart. The echocardiogram is simple, painless, obtained within a short period of time, and offers valuable information to your health care provider. The images from an echocardiogram can provide information such as:  Evidence of coronary artery disease (CAD).  Heart size.  Heart muscle function.  Heart  valve function.  Aneurysm detection.  Evidence of a past heart attack.  Fluid buildup around the heart.  Heart muscle thickening.  Assess heart valve function.  Tell a health care provider about:  Any allergies you have.  All medicines you are taking, including vitamins, herbs, eye drops, creams, and over-the-counter medicines.  Any problems you or family members have had with anesthetic medicines.  Any blood disorders you have.  Any surgeries you have had.  Any medical conditions you have.  Whether you are pregnant or may be pregnant. What happens before the procedure? No special preparation is needed. Eat and drink normally. What happens during the procedure?  In order to produce an image of your heart, gel will be applied to your chest and a wand-like tool (transducer) will be moved over your chest. The gel will help transmit the sound waves from the transducer. The sound waves will harmlessly bounce off your heart to allow the heart images to be captured in real-time motion. These images will then be recorded.  You may need an IV to receive a medicine that improves the quality of the pictures. What happens after the procedure? You may return to your normal schedule including diet, activities, and medicines, unless your health care provider tells you otherwise. This information is not intended to replace advice given to you by your health care provider. Make sure you discuss any questions you have with your health care provider. Document Released: 02/04/2000 Document Revised: 09/25/2015 Document Reviewed: 10/14/2012 Elsevier Interactive Patient Education  2017 [employer].  Cardiac Event Monitoring A cardiac event monitor is a small recording device that is used to detect abnormal heart rhythms (arrhythmias). The monitor is used  to record your heart rhythm when you have symptoms, such as:  Fast heartbeats (palpitations), such as heart racing or  fluttering.  Dizziness.  Fainting or light-headedness.  Unexplained weakness.  Some monitors are wired to electrodes placed on your chest. Electrodes are flat, sticky disks that attach to your skin. Other monitors may be hand-held or worn on the wrist. The monitor can be worn for up to 30 days. If the monitor is attached to your chest, a technician will prepare your chest for the electrode placement and show you how to work the monitor. Take time to practice using the monitor before you leave the office. Make sure you understand how to send the information from the monitor to your health care provider. In some cases, you may need to use a landline telephone instead of a cell phone. What are the risks? Generally, this device is safe to use, but it possible that the skin under the electrodes will become irritated. How to use your cardiac event monitor  Wear your monitor at all times, except when you are in water: ? Do not let the monitor get wet. ? Take the monitor off when you bathe. Do not swim or use a hot tub with it on.  Keep your skin clean. Do not put body lotion or moisturizer on your chest.  Change the electrodes as told by your health care provider or any time they stop sticking to your skin. You may need to use medical tape to keep them on.  Try to put the electrodes in slightly different places on your chest to help prevent skin irritation. They must remain in the area under your left breast and in the upper right section of your chest.  Make sure the monitor is safely clipped to your clothing or in a location close to your body that your health care provider recommends.  Press the button to record as soon as you feel heart-related symptoms, such as: ? Dizziness. ? Weakness. ? Light-headedness. ? Palpitations. ? Thumping or pounding in your chest. ? Shortness of breath. ? Unexplained weakness.  Keep a diary of your activities, such as walking, doing chores, and taking  medicine. It is very important to note what you were doing when you pushed the button to record your symptoms. This will help your health care provider determine what might be contributing to your symptoms.  Send the recorded information as recommended by your health care provider. It may take some time for your health care provider to process the results.  Change the batteries as told by your health care provider.  Keep electronic devices away from your monitor. This includes: ? Tablets. ? MP3 players. ? Cell phones.  While wearing your monitor you should avoid: ? Electric blankets. ? Armed forces operational officer. ? Electric toothbrushes. ? Microwave ovens. ? Magnets. ? Metal detectors. Get help right away if:  You have chest pain.  You have extreme difficulty breathing or shortness of breath.  You develop a very fast heartbeat that persists.  You develop dizziness that does not go away.  You faint or constantly feel like you are about to faint. Summary  A cardiac event monitor is a small recording device that is used to help detect abnormal heart rhythms (arrhythmias).  The monitor is used to record your heart rhythm when you have heart-related symptoms.  Make sure you understand how to send the information from the monitor to your health care provider.  It is important to press the button  on the monitor when you have any heart-related symptoms.  Keep a diary of your activities, such as walking, doing chores, and taking medicine. It is very important to note what you were doing when you pushed the button to record your symptoms. This will help your health care provider learn what might be causing your symptoms. This information is not intended to replace advice given to you by your health care provider. Make sure you discuss any questions you have with your health care provider. Document Released: 11/16/2007 Document Revised: 01/22/2016 Document Reviewed: 01/22/2016 Elsevier  Interactive Patient Education  2017 [employer].

## 2017-05-01 NOTE — Progress Notes (Addendum)
Cardiology Office Note   Date:  05/01/2017   ID:  JOUA BAKE, DOB 1960/11/10, MRN [id number]  PCP:  Tonia Ghent, MD  Cardiologist:   Kathlyn Sacramento, MD   Chief Complaint  Patient presents with  . other    Ref by Dr. Damita Dunnings for syncope with a having a stress test with Pennelope Bracken., was told has enlarged heart. Meds reviewed by the pt. verbally.  Pt. c/o feeling dizzy and lightheaded with a spell of syncope about two weeks ago with occas. squeezing in chest and shortness of breath.        History of Present Illness: April Stephenson is a 57 y.o. female who was referred by Dr. Damita Dunnings for evaluation of syncope.  The patient reports prolonged history of this with previous workup at [facility].  She reports having a stress test that did not show any significant abnormalities.  However, she reports that she did have a syncopal episode during the stress test.  According to her, her syncopal episodes or presyncope are very frequent and she had about 10 episodes in the last 4 months.  The episodes can happen anytime and typically are not triggered by any events.  She gets extremely dizzy and occasionally with complete loss of consciousness.  She does not seem to be very concerned about these episodes. She reports rare episodes of substernal chest tightness usually when she is under stress.  No exertional symptoms other than mild dyspnea. She was hospitalized in February with vague abdominal pain.  CT scan showed evidence of portal vein thrombosis of unclear etiology.  She was started on anticoagulation with Eliquis.  She is getting further workup by oncology.   The patient is not a smoker.  There is family history of coronary artery disease but not prematurely.    Past Medical History:  Diagnosis Date  . Arthritis    with longstanding foot and back pain after injuries, followed by Dr. Sharol Given  . Cancer (HCC)    cervical cancer  . Depression    per Dr. Silvio Pate with psych, h/o  suicide attempt at 57 y/o  . Dupuytren's contracture of left hand 2016  . GERD (gastroesophageal reflux disease)   . Impingement syndrome of left shoulder 06/16/2011  . Migraine with aura   . Portal vein thrombosis   . Suicidal overdose [facility])     Past Surgical History:  Procedure Laterality Date  . ABDOMINAL HYSTERECTOMY  2000   TAH/BSO  . APPENDECTOMY    . COLONOSCOPY    . FOOT SURGERY     R foot-great toe  . ORIF RADIAL FRACTURE Bilateral 03/13/2014   Procedure: OPEN REDUCTION INTERNAL FIXATION (ORIF) RADIAL FRACTURE;  Surgeon: Dayna Barker, MD;  Location: [facility];  Service: Plastics;  Laterality: Bilateral;  . SHOULDER SURGERY     2013  . TONSILLECTOMY       Current Outpatient Medications  Medication Sig Dispense Refill  . apixaban (ELIQUIS) 5 MG TABS tablet Take 1 tablet (5 mg total) by mouth 2 (two) times daily. 60 tablet 4  . butalbital-acetaminophen-caffeine (FIORICET, ESGIC) 50-325-40 MG tablet Take 1-2 tablets by mouth every 6 (six) hours as needed for headache. 20 tablet 0  . diclofenac sodium (VOLTAREN) 1 % GEL     . HYDROcodone-acetaminophen (NORCO) 10-325 MG per tablet Take 1 tablet by mouth 3 (three) times daily.    Marland Kitchen lisinopril (PRINIVIL,ZESTRIL) 10 MG tablet Take 10 mg by mouth daily.    . methocarbamol (  ROBAXIN) 500 MG tablet Take 500 mg by mouth 4 (four) times daily.    . ranitidine (ZANTAC) 150 MG tablet Take 150 mg by mouth 2 (two) times daily as needed (acid reflux).    Marland Kitchen VITAMIN E PO Take 500 mg by mouth daily.    Marland Kitchen zolpidem (AMBIEN) 10 MG tablet Take 1 tablet (10 mg total) by mouth at bedtime. 6 tablet 0   No current facility-administered medications for this visit.     Allergies:   Diclofenac sodium; Tramadol; Venlafaxine; and Voltaren  [diclofenac sodium]    Social History:  The patient  reports that  has never smoked. she has never used smokeless tobacco. She reports that she drinks alcohol. She reports that she does not use drugs.   Family History:   The patient's family history includes Colon cancer in her mother; Depression in her brother and mother; Heart disease in her father; Kidney cancer in her mother; Stroke in her father.    ROS:  Please see the history of present illness.   Otherwise, review of systems are positive for none.   All other systems are reviewed and negative.    PHYSICAL EXAM: VS:  BP 107/82 (BP Location: Right Arm, Patient Position: Sitting, Cuff Size: Normal)   Pulse 93   Ht 5\' 7"  (1.702 m)   Wt 155 lb 8 oz (70.5 kg)   BMI 24.35 kg/m  , BMI Body mass index is 24.35 kg/m. GEN: Well nourished, well developed, in no acute distress  HEENT: normal  Neck: no JVD, carotid bruits, or masses Cardiac: RRR; no murmurs, rubs, or gallops,no edema  Respiratory:  clear to auscultation bilaterally, normal work of breathing GI: soft, nontender, nondistended, + BS MS: no deformity or atrophy  Skin: warm and dry, no rash Neuro:  Strength and sensation are intact Psych: euthymic mood, full affect   EKG:  EKG is ordered today. The ekg ordered today demonstrates normal sinus rhythm with LVH.  Anterolateral ST and T wave changes suggestive of ischemia.   Recent Labs: 03/30/2017: ALT 13; BUN 19; Creatinine, Ser 0.91; Potassium 4.1; Sodium 139 04/11/2017: Hemoglobin 15.6; Platelets 266    Lipid Panel No results found for: CHOL, TRIG, HDL, CHOLHDL, VLDL, LDLCALC, LDLDIRECT    Wt Readings from Last 3 Encounters:  05/01/17 155 lb 8 oz (70.5 kg)  04/24/17 162 lb (73.5 kg)  04/11/17 154 lb 3.2 oz (69.9 kg)       PAD Screen 05/01/2017  Previous PAD dx? No  Previous surgical procedure? No  Pain with walking? No  Feet/toe relief with dangling? No  Painful, non-healing ulcers? No  Extremities discolored? No      ASSESSMENT AND PLAN:  1.  Syncope: Likely neurocardiogenic given multiple recurrent episodes.  However, some of these episodes are sudden without any preceding symptoms which raises concern for arrhythmia.   Due to that, I requested a 2-week outpatient monitor. Orthostatic vital signs today reveals significant increase in heart rate from 94-119 without significant change in blood pressure.  This is suggestive of postural orthostatic tachycardia syndrome which might be contributing to some of her symptoms.  I advised her to stay well-hydrated and we can consider switching lisinopril to a beta-blocker in the near future. Given associated dyspnea and abnormal baseline EKG, I requested an echocardiogram to ensure no structural heart abnormalities.  2.  Atypical chest pain: Mostly related to stress and not with physical activities.  Nonetheless, baseline EKG is abnormal.  We requested her previous cardiac  records from the [place] before proceeding with further ischemic workup.  3.  Essential hypertension: Controlled with lisinopril.  4.  Recent diagnosis of portal vein thrombosis of unclear etiology: Currently on anticoagulation with Eliquis and further hypercoagulable workup is pending.  Addendum: Records were reviewed from the [place].  She had a nuclear stress test in May 2018 which was normal.  EF was 76% with hypertensive response to exercise.   Disposition:   FU with me in 1 month  Signed,  Kathlyn Sacramento, MD  05/01/2017 3:03 PM    [facility]

## 2017-05-03 ENCOUNTER — Other Ambulatory Visit: Payer: Self-pay | Admitting: Cardiovascular Disease

## 2017-05-03 DIAGNOSIS — R0602 Shortness of breath: Secondary | ICD-10-CM

## 2017-05-03 DIAGNOSIS — R55 Syncope and collapse: Secondary | ICD-10-CM

## 2017-05-15 DIAGNOSIS — R55 Syncope and collapse: Secondary | ICD-10-CM

## 2017-05-18 ENCOUNTER — Other Ambulatory Visit: Payer: Self-pay

## 2017-05-18 ENCOUNTER — Ambulatory Visit (INDEPENDENT_AMBULATORY_CARE_PROVIDER_SITE_OTHER): Payer: Medicare Other

## 2017-05-18 DIAGNOSIS — R0602 Shortness of breath: Secondary | ICD-10-CM

## 2017-05-18 DIAGNOSIS — R55 Syncope and collapse: Secondary | ICD-10-CM | POA: Diagnosis not present

## 2017-06-06 ENCOUNTER — Telehealth: Payer: Self-pay | Admitting: *Deleted

## 2017-06-06 MED ORDER — METOPROLOL TARTRATE 25 MG PO TABS: 25.0000 mg | ORAL_TABLET | Freq: Two times a day (BID) | ORAL | 3 refills | Status: DC

## 2017-06-06 NOTE — Telephone Encounter (Signed)
Spoke with patient and reviewed results, recommendations, medication changes, along with follow up appointment. Instructed her to discontinue lisinopril, start metoprolol 25 mg twice daily and confirmed her upcoming appointment here in our office. She verbalized understanding of our conversation, agreement with plan, and had no further questions at this time.

## 2017-06-06 NOTE — Telephone Encounter (Signed)
Patient called and is inquiring about imaging results. Patient states you can call her . Her contact # is [phone]. Please advise. Thank you

## 2017-06-20 ENCOUNTER — Encounter: Payer: Self-pay | Admitting: *Deleted

## 2017-06-27 ENCOUNTER — Ambulatory Visit (INDEPENDENT_AMBULATORY_CARE_PROVIDER_SITE_OTHER): Payer: Medicare Other | Admitting: Nurse Practitioner

## 2017-06-27 ENCOUNTER — Telehealth: Payer: Self-pay | Admitting: Nurse Practitioner

## 2017-06-27 ENCOUNTER — Encounter: Payer: Self-pay | Admitting: Nurse Practitioner

## 2017-06-27 VITALS — BP 94/70 | HR 80 | Ht 67.0 in | Wt 166.0 lb

## 2017-06-27 DIAGNOSIS — I951 Orthostatic hypotension: Secondary | ICD-10-CM | POA: Diagnosis not present

## 2017-06-27 DIAGNOSIS — I471 Supraventricular tachycardia: Secondary | ICD-10-CM

## 2017-06-27 DIAGNOSIS — R55 Syncope and collapse: Secondary | ICD-10-CM | POA: Diagnosis not present

## 2017-06-27 MED ORDER — METOPROLOL TARTRATE 25 MG PO TABS: 12.5000 mg | ORAL_TABLET | Freq: Two times a day (BID) | ORAL | 3 refills | Status: DC

## 2017-06-27 MED ORDER — METOPROLOL TARTRATE 25 MG PO TABS: 12.5000 mg | ORAL_TABLET | Freq: Two times a day (BID) | ORAL | Status: DC

## 2017-06-27 NOTE — Patient Instructions (Addendum)
Medication Instructions:  Your physician has recommended you make the following change in your medication:   1. DECREASE Metoprolol tartrate 25 mg- take 1/2 tablet (12.5 mg) by mouth twice daily   Labwork: - Your physician recommends that you have lab work today: TSH   Testing/Procedures: - none ordered  Follow-Up: Your physician recommends that you schedule a follow-up appointment in: 6-8 weeks with Dr. Fletcher Anon   Any Other Special Instructions Will Be Listed Below (If Applicable).     If you need a refill on your cardiac medications before your next appointment, please call your pharmacy.

## 2017-06-27 NOTE — Telephone Encounter (Signed)
Pt calling asking if we could send her OV not along with her prescriptions to the [place] for they handle her medications  Please fax it to : [place]

## 2017-06-27 NOTE — Progress Notes (Signed)
Office Visit    Patient Name: April Stephenson Date of Encounter: 06/27/2017  Primary Care Provider:  Tonia Ghent, MD Primary Cardiologist:  Kathlyn Sacramento, MD  Chief Complaint    57 year old female with a history of atypical chest pain, orthostatic hypotension, syncope, GERD, portal vein thrombosis, and recent finding of PSVT, who presents for follow-up.  Past Medical History    Past Medical History:  Diagnosis Date  . Arthritis    with longstanding foot and back pain after injuries, followed by Dr. Sharol Given  . Atypical chest pain    a. 06/2016 MV: EF 76%, HTN response, no ischemia/infarct (performed @ [facility]).  . Cancer ([place])    cervical cancer  . Depression    per Dr. Silvio Pate with psych, h/o suicide attempt at 57 y/o  . Dupuytren's contracture of left hand 2016  . GERD (gastroesophageal reflux disease)   . Impingement syndrome of left shoulder 06/16/2011  . Migraine with aura   . Orthostatic hypotension   . Portal vein thrombosis    a. unclear etiology->on eliquis.  Marland Kitchen PSVT (paroxysmal supraventricular tachycardia) ([place])    a. 05/2017 2wk Event Monitor: Intermittent SVT, fastest 190 bpm, longest 14 seconds. No significant bradycardia. Avg HR 81.  . Suicidal overdose ([place])   . Syncope    a. 04/2017 Echo: EF 55-60%, Gr1 DD, no rwma, nl RV fxn.   Past Surgical History:  Procedure Laterality Date  . ABDOMINAL HYSTERECTOMY  2000   TAH/BSO  . APPENDECTOMY    . COLONOSCOPY    . FOOT SURGERY     R foot-great toe  . ORIF RADIAL FRACTURE Bilateral 03/13/2014   Procedure: OPEN REDUCTION INTERNAL FIXATION (ORIF) RADIAL FRACTURE;  Surgeon: Dayna Barker, MD;  Location: [facility];  Service: Plastics;  Laterality: Bilateral;  . SHOULDER SURGERY     2013  . TONSILLECTOMY      Allergies  Allergies  Allergen Reactions  . Diclofenac Sodium Other (See Comments)    Nausea Per pt can tolerate toradol IM fine  . Diclofenac Other (See Comments)  . Tramadol Other (See  Comments) and Nausea And Vomiting    Dizzy, doesn't work for patient Dizzy, doesn't work for patient Dizzy, doesn't work for patient  Dizzy, doesn't work for patient  . Venlafaxine Other (See Comments)    syncope syncope  . Voltaren  [Diclofenac Sodium] Other (See Comments)    History of Present Illness    57 year old female with the above past medical history including atypical chest pain with negative Myoview May 2018, orthostatic hypotension, syncope, and portal vein thrombosis-now on Eliquis.  She was recently evaluated by Dr. Fletcher Anon in March 2019 secondary to recurrent syncopal episodes.  There was concern for possible arrhythmia and a 2-week event monitor was placed.  This returned showing intermittent paroxysmal supraventricular tachycardia with rates as high as 190 bpm, sometimes lasting up to 14 seconds.  Patient says she did experience presyncope while wearing the monitor but never noticed palpitations.  Following return of the monitor, lisinopril was discontinued and she was placed on metoprolol 25 mg twice daily.  After initiating metoprolol, she did not have any more syncopal spells but did have a few episodes of prolonged dizziness associated with low blood pressures, sometimes as low as the high 60s.  She has a chart with her blood pressures today.  It is not uncommon for high blood pressures in the 80s since starting metoprolol.  She denies chest pain, dyspnea, palpitations, PND, orthopnea,  syncope, edema, or early satiety.  Home Medications    Prior to Admission medications   Medication Sig Start Date End Date Taking? Authorizing Provider  apixaban (ELIQUIS) 5 MG TABS tablet Take 1 tablet (5 mg total) by mouth 2 (two) times daily. 03/30/17  Yes Tonia Ghent, MD  butalbital-acetaminophen-caffeine [facility] ESGIC) [phone] MG tablet Take 1-2 tablets by mouth every 6 (six) hours as needed for headache. 03/14/17 03/14/18 Yes Baity, Coralie Keens, NP  diazepam (VALIUM) 5 MG tablet Take  5 mg by mouth at bedtime. 05/22/17  Yes [provider]  diclofenac sodium (VOLTAREN) 1 % GEL  03/27/16  Yes [provider]  HYDROcodone-acetaminophen (NORCO) 10-325 MG per tablet Take 1 tablet by mouth 3 (three) times daily.   Yes [provider]  methocarbamol (ROBAXIN) 500 MG tablet Take 500 mg by mouth 4 (four) times daily.   Yes [provider]  metoprolol tartrate (LOPRESSOR) 25 MG tablet Take 0.5 tablets (12.5 mg total) by mouth 2 (two) times daily. 06/27/17 09/25/17 Yes Theora Gianotti, NP  ranitidine (ZANTAC) 150 MG tablet Take 150 mg by mouth 2 (two) times daily as needed (acid reflux).   Yes [provider]  VITAMIN E PO Take 500 mg by mouth daily.   Yes [provider]  zolpidem (AMBIEN) 10 MG tablet Take 1 tablet (10 mg total) by mouth at bedtime. 10/05/16  Yes Tonia Ghent, MD    Review of Systems    No recurrent syncope or presyncope but she did have prolonged episodes of dizziness in the setting of low blood pressure since starting metoprolol.  She denies chest pain, dyspnea, palpitations, PND, orthopnea, edema, or early satiety.  All other systems reviewed and are otherwise negative except as noted above.  Physical Exam    VS:  BP 94/70 (BP Location: Left Arm, Patient Position: Sitting, Cuff Size: Normal)   Pulse 80   Ht 5\' 7"  (1.702 m)   Wt 166 lb (75.3 kg)   BMI 26.00 kg/m  , BMI Body mass index is 26 kg/m. GEN: Well nourished, well developed, in no acute distress.  HEENT: normal.  Neck: Supple, no JVD, carotid bruits, or masses. Cardiac: RRR, no murmurs, rubs, or gallops. No clubbing, cyanosis, edema.  Radials/DP/PT 2+ and equal bilaterally.  Respiratory:  Respirations regular and unlabored, clear to auscultation bilaterally. GI: Soft, nontender, nondistended, BS + x 4. MS: no deformity or atrophy. Skin: warm and dry, no rash. Neuro:  Strength and sensation are intact. Psych: Normal affect.  Accessory  Clinical Findings    ECG -regular sinus rhythm, 80, LVH, nonspecific ST and T changes.  Lab Results  Component Value Date   CREATININE 0.91 03/30/2017   BUN 19 03/30/2017   NA 139 03/30/2017   K 4.1 03/30/2017   CL 105 03/30/2017   CO2 29 03/30/2017   Lab Results  Component Value Date   WBC 9.5 04/11/2017   HGB 15.6 04/11/2017   HCT 46.1 04/11/2017   MCV 97.4 04/11/2017   PLT 266 04/11/2017    Assessment & Plan    1.  Paroxysmal supraventricular tachycardia:  Recently noted on 2-week event monitor.  She did have triggered events for lightheadedness which were generally brief, not lasting more than 14 seconds.  The fastest rate recorded 190 bpm, which lasted 7.7 seconds.  She had 7 triggered events associated with sinus rhythm and 2 diary entries associated with sinus rhythm.  Since being placed on metoprolol, she has not  had any syncope but has had hypotension with dizziness.  I am going to reduce her metoprolol to 12.5 mg twice daily.  We discussed that PSVT may be contributing to syncopal spells and that if we cannot adequately manage her with low-dose beta-blocker, we we will need to consider referral for electrophysiology evaluation.  She would like to try low-dose metoprolol first and is not interested in EP referral or considering ablation at this time.  2.  Syncope: Normal LV function on recent echo.  Monitoring as above.  3.  Orthostatic hypotension: As above, on May 7, she had prolonged episode of lower blood pressures associated with dizziness.  As result I will reduce metoprolol to 12.5 twice daily.  We discussed the point of adequate hydration.  4.  Atypical chest pain: No recent recurrence.  Negative Myoview last year.  5.  Disposition: F/u TSH today.  Follow-up in 6 to 8 weeks.  I asked her to call us if she has recurrent syncope, at which time we can see her sooner and/or refer to EP.   Murray Hodgkins, NP 06/27/2017, 4:57 PM

## 2017-06-28 LAB — TSH: TSH: 1.5 u[IU]/mL (ref 0.450–4.500)

## 2017-06-28 NOTE — Telephone Encounter (Signed)
A copy of Emeline Gins (NP) note from [place] office visit and a RX for metoprolol tartrate 25 mg- 1/2 tablet (12.5 mg) by mouth BID was faxed to the [place] at [phone] Attn: Ruby Cola.  Done per the patient's request. Confirmation received.

## 2017-07-10 ENCOUNTER — Other Ambulatory Visit: Payer: Self-pay | Admitting: Internal Medicine

## 2017-07-10 DIAGNOSIS — I81 Portal vein thrombosis: Secondary | ICD-10-CM

## 2017-07-11 ENCOUNTER — Inpatient Hospital Stay (HOSPITAL_BASED_OUTPATIENT_CLINIC_OR_DEPARTMENT_OTHER): Payer: Medicare Other | Admitting: Internal Medicine

## 2017-07-11 ENCOUNTER — Encounter: Payer: Self-pay | Admitting: Internal Medicine

## 2017-07-11 ENCOUNTER — Inpatient Hospital Stay: Payer: Medicare Other | Attending: Internal Medicine

## 2017-07-11 ENCOUNTER — Other Ambulatory Visit: Payer: Self-pay

## 2017-07-11 VITALS — BP 118/80 | HR 85 | Temp 98.4°F | Resp 20 | Ht 67.0 in | Wt 165.0 lb

## 2017-07-11 DIAGNOSIS — I81 Portal vein thrombosis: Secondary | ICD-10-CM

## 2017-07-11 DIAGNOSIS — Z8541 Personal history of malignant neoplasm of cervix uteri: Secondary | ICD-10-CM | POA: Insufficient documentation

## 2017-07-11 DIAGNOSIS — Z7901 Long term (current) use of anticoagulants: Secondary | ICD-10-CM | POA: Insufficient documentation

## 2017-07-11 DIAGNOSIS — Z90710 Acquired absence of both cervix and uterus: Secondary | ICD-10-CM | POA: Insufficient documentation

## 2017-07-11 DIAGNOSIS — N96 Recurrent pregnancy loss: Secondary | ICD-10-CM | POA: Insufficient documentation

## 2017-07-11 LAB — CBC WITH DIFFERENTIAL/PLATELET
BASOS ABS: 0 10*3/uL (ref 0–0.1)
BASOS PCT: 1 %
EOS ABS: 0.2 10*3/uL (ref 0–0.7)
EOS PCT: 3 %
HCT: 39 % (ref 35.0–47.0)
HEMOGLOBIN: 13.9 g/dL (ref 12.0–16.0)
Lymphocytes Relative: 42 %
Lymphs Abs: 2.6 10*3/uL (ref 1.0–3.6)
MCH: 33.6 pg (ref 26.0–34.0)
MCHC: 35.5 g/dL (ref 32.0–36.0)
MCV: 94.6 fL (ref 80.0–100.0)
Monocytes Absolute: 0.4 10*3/uL (ref 0.2–0.9)
Monocytes Relative: 6 %
NEUTROS PCT: 48 %
Neutro Abs: 3 10*3/uL (ref 1.4–6.5)
PLATELETS: 189 10*3/uL (ref 150–440)
RBC: 4.13 MIL/uL (ref 3.80–5.20)
RDW: 13.1 % (ref 11.5–14.5)
WBC: 6.2 10*3/uL (ref 3.6–11.0)

## 2017-07-11 NOTE — Progress Notes (Signed)
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  EGD/Colo-NEG [[place]];   # cervical cancer [screening Pap smears early-stage] status post hysterectomy; BSO.  No chemoradiation.     Cervical cancer (HCC)     HISTORY OF PRESENTING ILLNESS:  April Stephenson 57 y.o.  female history of portal vein thrombosis of unclear etiology currently on Eliquis is here for follow-up.  Patient has known completing.  Denies any nosebleeds.  Denies any falls.  None no blood clots.   Review of Systems  Constitutional: Negative for chills, diaphoresis, fever, malaise/fatigue and weight loss.  HENT: Negative for nosebleeds and sore throat.   Eyes: Negative for double vision.  Respiratory: Negative for cough, hemoptysis, sputum production, shortness of breath and wheezing.   Cardiovascular: Negative for chest pain, palpitations, orthopnea and leg swelling.  Gastrointestinal: Negative for abdominal pain, blood in stool, constipation, diarrhea, heartburn, melena, nausea and vomiting.  Genitourinary: Negative for dysuria, frequency and urgency.  Musculoskeletal: Negative for back pain and joint pain.  Skin: Negative.  Negative for itching and rash.  Neurological: Negative for dizziness, tingling, focal weakness, weakness and headaches.  Endo/Heme/Allergies: Does not bruise/bleed easily.  Psychiatric/Behavioral:  Negative for depression. The patient is not nervous/anxious and does not have insomnia.      MEDICAL HISTORY:  Past Medical History:  Diagnosis Date  . Arthritis    with longstanding foot and back pain after injuries, followed by Dr. Sharol Given  . Atypical chest pain    a. 06/2016 MV: EF 76%, HTN response, no ischemia/infarct (performed @ [facility]).  . Cancer ([place])    cervical cancer  . Depression    per Dr. Silvio Pate with psych, h/o suicide attempt at 57 y/o  . Dupuytren's contracture of left hand 2016  . GERD (gastroesophageal reflux disease)   . Impingement syndrome of left shoulder 06/16/2011  . Migraine with aura   . Orthostatic hypotension   . Portal vein thrombosis    a. unclear etiology->on eliquis.  Marland Kitchen PSVT (paroxysmal supraventricular tachycardia) ([place])    a. 05/2017 2wk Event Monitor: Intermittent SVT, fastest 190 bpm, longest 14 seconds. No significant bradycardia. Avg HR 81.  . Suicidal overdose ([place])   . Syncope    a. 04/2017 Echo: EF 55-60%, Gr1 DD, no rwma, nl RV fxn.    SURGICAL HISTORY: Past Surgical History:  Procedure Laterality Date  . ABDOMINAL HYSTERECTOMY  2000   TAH/BSO  . APPENDECTOMY    . COLONOSCOPY    . FOOT SURGERY     R foot-great toe  . ORIF RADIAL FRACTURE Bilateral 03/13/2014   Procedure: OPEN REDUCTION INTERNAL FIXATION (ORIF) RADIAL FRACTURE;  Surgeon: Dayna Barker, MD;  Location: [facility];  Service: Plastics;  Laterality: Bilateral;  . SHOULDER SURGERY     2013  . TONSILLECTOMY      SOCIAL HISTORY: lives in [place]; no smkoig/acpol; 3 chilrsn; wordedk for post ofice Social History   Socioeconomic History  . Marital status:  Divorced    Spouse name: Not on file  . Number of children: Not on file  . Years of education: Not on file  . Highest education level: Not on file  Occupational History  . Not on file  Social Needs  . Financial resource strain: Not on file  . Food insecurity:    Worry: Not on file    Inability: Not on file   . Transportation needs:    Medical: Not on file    Non-medical: Not on file  Tobacco Use  . Smoking status: Never Smoker  . Smokeless tobacco: Never Used  Substance and Sexual Activity  . Alcohol use: Yes    Alcohol/week: 0.0 oz    Comment: occassionally  . Drug use: No  . Sexual activity: Yes  Lifestyle  . Physical activity:    Days per week: Not on file    Minutes per session: Not on file  . Stress: Not on file  Relationships  . Social connections:    Talks on phone: Not on file    Gets together: Not on file    Attends religious service: Not on file    Active member of club or organization: Not on file    Attends meetings of clubs or organizations: Not on file    Relationship status: Not on file  . Intimate partner violence:    Fear of current or ex partner: Not on file    Emotionally abused: Not on file    Physically abused: Not on file    Forced sexual activity: Not on file  Other Topics Concern  . Not on file  Social History Narrative   3 kids, 1 with down's, 1 with asperger's   Divorced   Works at [employer], former Counselling psychologist     FAMILY HISTORY: Family History  Problem Relation Age of Onset  . Depression Mother   . Colon cancer Mother   . Kidney cancer Mother   . Heart disease Father   . Stroke Father   . Depression Brother     ALLERGIES:  is allergic to diclofenac sodium; diclofenac; tramadol; venlafaxine; and voltaren  [diclofenac sodium].  MEDICATIONS:  Current Outpatient Medications  Medication Sig Dispense Refill  . apixaban (ELIQUIS) 5 MG TABS tablet Take 1 tablet (5 mg total) by mouth 2 (two) times daily. 60 tablet 4  . diazepam (VALIUM) 5 MG tablet Take 5 mg by mouth at bedtime.  0  . diclofenac sodium (VOLTAREN) 1 % GEL     . HYDROcodone-acetaminophen (NORCO) 10-325 MG per tablet Take 1 tablet by mouth 3 (three) times daily.    . metoprolol tartrate (LOPRESSOR) 25 MG tablet Take 0.5 tablets (12.5 mg total) by mouth 2 (two) times daily. 45  tablet 3  . ranitidine (ZANTAC) 150 MG tablet Take 150 mg by mouth 2 (two) times daily as needed (acid reflux).    Marland Kitchen VITAMIN E PO Take 500 mg by mouth daily.    Marland Kitchen zolpidem (AMBIEN) 10 MG tablet Take 1 tablet (10 mg total) by mouth at bedtime. 6 tablet 0  . methocarbamol (ROBAXIN) 500 MG tablet Take 500 mg by mouth every 8 (eight) hours as needed for muscle spasms.      No current facility-administered medications for this visit.       Marland Kitchen  PHYSICAL EXAMINATION: ECOG PERFORMANCE STATUS: 0 - Asymptomatic  Vitals:   07/11/17 1116  BP: 118/80  Pulse: 85  Resp: 20  Temp: 98.4 F (36.9 C)  Filed Weights   07/11/17 1116  Weight: 165 lb (74.8 kg)    GENERAL: Well-nourished well-developed; Alert, no distress and comfortable.  Accompanied by family.  EYES: no pallor or icterus OROPHARYNX: no thrush or ulceration; NECK: supple; no lymph nodes felt. LYMPH:  no palpable lymphadenopathy in the axillary or inguinal regions LUNGS: Decreased breath sounds auscultation bilaterally. No wheeze or crackles HEART/CVS: regular rate & rhythm and no murmurs; No lower extremity edema ABDOMEN:abdomen soft, non-tender and normal bowel sounds. No hepatomegaly or splenomegaly.  Musculoskeletal:no cyanosis of digits and no clubbing  PSYCH: alert & oriented x 3 with fluent speech NEURO: no focal motor/sensory deficits SKIN:  no rashes or significant lesions  LABORATORY DATA:  I have reviewed the data as listed Lab Results  Component Value Date   WBC 6.2 07/11/2017   HGB 13.9 07/11/2017   HCT 39.0 07/11/2017   MCV 94.6 07/11/2017   PLT 189 07/11/2017   Recent Labs    03/16/17 1816 03/30/17 1156  NA 139 139  K 3.6 4.1  CL 106 105  CO2 21* 29  GLUCOSE 125* 116*  BUN 19 19  CREATININE 1.07* 0.91  CALCIUM 9.0 9.3  GFRNONAA 57*  --   GFRAA >60  --   PROT 7.2 7.6  ALBUMIN 4.1 4.4  AST 16 7  ALT 18 13  ALKPHOS 87 65  BILITOT 0.7 0.3    RADIOGRAPHIC STUDIES: I have personally reviewed  the radiological images as listed and agreed with the findings in the report. No results found.  ASSESSMENT & PLAN:   Portal vein thrombosis #Symptomatic portal vein thrombosis- [March 23, 2017]-currently on Eliquis; no evidence of any progression/new blood clots.  Stable.  Continue Eliquis for total of 6 months [until end of July 2019].  #Etiology of patient's hypercoagulable state is unclear-work-up including PNH/Jak 2.  Given the indeterminate IgM antiphospholipid/and also history of prior miscarriages x2-I would recommend repeating antiphospholipid antibody panel in approximately 4 months [off anticoagulation.]  # follow up in 4 months/labs- 2 weeks prior; APS testing.  Cc; Dr.Duncan  All questions were answered. The patient knows to call the clinic with any problems, questions or concerns.     Cammie Sickle, MD 07/11/2017 5:27 PM

## 2017-07-11 NOTE — Assessment & Plan Note (Addendum)
#  Symptomatic portal vein thrombosis- [March 23, 2017]-currently on Eliquis; no evidence of any progression/new blood clots.  Stable.  Continue Eliquis for total of 6 months [until end of July 2019].  #Etiology of patient's hypercoagulable state is unclear-work-up including PNH/Jak 2.  Given the indeterminate IgM antiphospholipid/and also history of prior miscarriages x2-I would recommend repeating antiphospholipid antibody panel in approximately 4 months [off anticoagulation.]  # follow up in 4 months/labs- 2 weeks prior; APS testing.  Cc; Dr.Duncan

## 2017-07-11 NOTE — Patient Instructions (Signed)
#  Continue Eliquis until end of July; then stop.  #Have labs done end of September/2 weeks prior to next visit.

## 2017-07-17 ENCOUNTER — Encounter: Payer: Self-pay | Admitting: Family Medicine

## 2017-07-17 ENCOUNTER — Ambulatory Visit (INDEPENDENT_AMBULATORY_CARE_PROVIDER_SITE_OTHER): Payer: Medicare Other | Admitting: Family Medicine

## 2017-07-17 DIAGNOSIS — T148XXA Other injury of unspecified body region, initial encounter: Secondary | ICD-10-CM

## 2017-07-17 MED ORDER — TRIAMCINOLONE ACETONIDE 0.1 % EX CREA: 1.0000 "application " | TOPICAL_CREAM | Freq: Two times a day (BID) | CUTANEOUS | 0 refills | Status: DC | PRN

## 2017-07-17 NOTE — Progress Notes (Signed)
Reds spot/bruise on the L>R forearm, on flexor side.  Itchy some of the time.  Some bleeding on from the lesion on the L proximal forearm- she had a few pinpoint areas with small drops of blood previously noted..  Safe at home, denies assault.  No gum bleeding.  No nosebleeds.  No blood in urine or stools.  The spots were more noticeable after the hematology appointment but had started at that point.  Prev CBC unremarkable on 07/11/17.  Skin well perfused.  Meds, vitals, and allergies reviewed.   ROS: Per HPI unless specifically indicated in ROS section   nad ncat rrr ctab abd soft Ext w/o edema She has what looks to be nontender and otherwise unremarkable variable bruising on the medial side of the bilateral forearms, left greater than right.  No bruising on the extensor surfaces.

## 2017-07-17 NOTE — Patient Instructions (Signed)
Try to leave the spots alone and use TAC cream if needed for itching.  Take care.  Glad to see you.

## 2017-07-18 DIAGNOSIS — T148XXA Other injury of unspecified body region, initial encounter: Secondary | ICD-10-CM | POA: Insufficient documentation

## 2017-07-18 NOTE — Assessment & Plan Note (Signed)
This looks to be bruises on the forearm.  She denies assault.  She says she is safe at home.  She does not have any other ominous findings or historical items.  She is on a blood thinner which likely exacerbates the bruising tendency.  Recent CBC had normal platelets.  I would not intervene other than using topical triamcinolone cream on the areas as needed for itching.  If progressive or other symptoms then she can update me.  She agrees.

## 2017-08-21 ENCOUNTER — Ambulatory Visit (INDEPENDENT_AMBULATORY_CARE_PROVIDER_SITE_OTHER): Payer: Medicare Other | Admitting: Nurse Practitioner

## 2017-08-21 ENCOUNTER — Encounter: Payer: Self-pay | Admitting: Nurse Practitioner

## 2017-08-21 VITALS — BP 118/80 | HR 82 | Ht 67.0 in | Wt 168.5 lb

## 2017-08-21 DIAGNOSIS — R55 Syncope and collapse: Secondary | ICD-10-CM | POA: Diagnosis not present

## 2017-08-21 DIAGNOSIS — I471 Supraventricular tachycardia: Secondary | ICD-10-CM

## 2017-08-21 DIAGNOSIS — I951 Orthostatic hypotension: Secondary | ICD-10-CM | POA: Diagnosis not present

## 2017-08-21 NOTE — Patient Instructions (Signed)
Follow-Up: Your physician wants you to follow-up in: 6 months with Dr. Arida. You will receive a reminder letter in the mail two months in advance. If you don't receive a letter, please call our office to schedule the follow-up appointment.  It was a pleasure seeing you today here in the office. Please do not hesitate to give us a call back if you have any further questions. [phone]  Mikle Sternberg A. RN, BSN     

## 2017-08-21 NOTE — Progress Notes (Signed)
Office Visit    Patient Name: April Stephenson Date of Encounter: 08/21/2017  Primary Care Provider:  Tonia Ghent, MD Primary Cardiologist:  Kathlyn Sacramento, MD  Chief Complaint    57 year old female with a history of atypical chest pain, orthostatic hypotension, syncope, GERD, portal vein thrombosis, and paroxysmal supraventricular tachycardia, who presents for follow-up.  Past Medical History    Past Medical History:  Diagnosis Date  . Arthritis    with longstanding foot and back pain after injuries, followed by Dr. Sharol Given  . Atypical chest pain    a. 06/2016 MV: EF 76%, HTN response, no ischemia/infarct (performed @ [facility]).  . Cancer ([place])    cervical cancer  . Depression    per Dr. Silvio Pate with psych, h/o suicide attempt at 57 y/o  . Dupuytren's contracture of left hand 2016  . GERD (gastroesophageal reflux disease)   . Impingement syndrome of left shoulder 06/16/2011  . Migraine with aura   . Orthostatic hypotension   . Portal vein thrombosis    a. unclear etiology->on eliquis.  Marland Kitchen PSVT (paroxysmal supraventricular tachycardia) ([place])    a. 05/2017 2wk Event Monitor: Intermittent SVT, fastest 190 bpm, longest 14 seconds. No significant bradycardia. Avg HR 81.  . Suicidal overdose ([place])   . Syncope    a. 04/2017 Echo: EF 55-60%, Gr1 DD, no rwma, nl RV fxn.   Past Surgical History:  Procedure Laterality Date  . ABDOMINAL HYSTERECTOMY  2000   TAH/BSO  . APPENDECTOMY    . COLONOSCOPY    . FOOT SURGERY     R foot-great toe  . ORIF RADIAL FRACTURE Bilateral 03/13/2014   Procedure: OPEN REDUCTION INTERNAL FIXATION (ORIF) RADIAL FRACTURE;  Surgeon: Dayna Barker, MD;  Location: [facility];  Service: Plastics;  Laterality: Bilateral;  . SHOULDER SURGERY     2013  . TONSILLECTOMY      Allergies  Allergies  Allergen Reactions  . Diclofenac Sodium Other (See Comments)    Nausea Per pt can tolerate toradol IM fine  . Diclofenac Other (See Comments)  . Tramadol  Other (See Comments) and Nausea And Vomiting    Dizzy, doesn't work for patient Dizzy, doesn't work for patient Dizzy, doesn't work for patient  Dizzy, doesn't work for patient  . Venlafaxine Other (See Comments)    syncope syncope  . Voltaren  [Diclofenac Sodium] Other (See Comments)    History of Present Illness    57 year old female with the above past medical history including atypical chest pain with negative Myoview May 2018, orthostatic hypotension, syncope, portal vein thrombosis-treated with Eliquis, and PSVT.  In March 2019 she was evaluated secondary to recurrent syncope.  2-week event monitoring showed intermittent paroxysmal supraventricular tachycardia with rates as high as 190 bpm, sometimes lasting up to 14 seconds.  Patient did not have palpitations while wearing monitor but did have, but did report presyncope associated with sinus rhythm.  Following monitoring, lisinopril was discontinued and she was placed on metoprolol 25 twice daily.  When I saw her in clinic in May, she showed me records of her blood pressures with systolics often in the 72C associated with lightheadedness.  I reduced her metoprolol to 12.5 mg twice daily.  Since then, she has done quite well.  She has had no recurrence of orthostatic lightheadedness and has not had any palpitations or presyncope/syncope.  She says sometimes if she is feeling jittery, she will take a whole metoprolol dose instead of a half a tablet and  notes that that jittery sensation resolves.  She has never checked her blood pressure or heart rate  during 1 of those episodes.  She denies chest pain, palpitations, PND, orthopnea, dyspnea, dizziness, syncope, edema, or early satiety.  Home Medications    Prior to Admission medications   Medication Sig Start Date End Date Taking? Authorizing Provider  apixaban (ELIQUIS) 5 MG TABS tablet Take 1 tablet (5 mg total) by mouth 2 (two) times daily. 03/30/17  Yes Tonia Ghent, MD  diazepam  (VALIUM) 5 MG tablet Take 5 mg by mouth at bedtime. 05/22/17  Yes [provider]  diclofenac sodium (VOLTAREN) 1 % GEL  03/27/16  Yes [provider]  HYDROcodone-acetaminophen (NORCO) 10-325 MG per tablet Take 1 tablet by mouth 3 (three) times daily.   Yes [provider]  methocarbamol (ROBAXIN) 500 MG tablet Take 500 mg by mouth every 8 (eight) hours as needed for muscle spasms.    Yes [provider]  metoprolol tartrate (LOPRESSOR) 25 MG tablet Take 0.5 tablets (12.5 mg total) by mouth 2 (two) times daily. 06/27/17 09/25/17 Yes Theora Gianotti, NP  ranitidine (ZANTAC) 150 MG tablet Take 150 mg by mouth 2 (two) times daily as needed (acid reflux).   Yes [provider]  triamcinolone cream (KENALOG) 0.1 % Apply 1 application topically 2 (two) times daily as needed. 07/17/17  Yes Tonia Ghent, MD  VITAMIN E PO Take 500 mg by mouth daily.   Yes [provider]  zolpidem (AMBIEN) 10 MG tablet Take 1 tablet (10 mg total) by mouth at bedtime. 10/05/16  Yes Tonia Ghent, MD    Review of Systems    Occasionally feels jittery.  She denies chest pain, palpitations, dyspnea, pnd, orthopnea, n, v, dizziness, syncope, edema, weight gain, or early satiety.  All other systems reviewed and are otherwise negative except as noted above.  Physical Exam    VS:  BP 118/80 (BP Location: Left Arm, Patient Position: Sitting, Cuff Size: Normal)   Pulse 82   Ht 5\' 7"  (1.702 m)   Wt 168 lb 8 oz (76.4 kg)   BMI 26.39 kg/m  , BMI Body mass index is 26.39 kg/m. GEN: Well nourished, well developed, in no acute distress.  HEENT: normal.  Neck: Supple, no JVD, carotid bruits, or masses. Cardiac: RRR, no murmurs, rubs, or gallops. No clubbing, cyanosis, edema.  Radials/DP/PT 2+ and equal bilaterally.  Respiratory:  Respirations regular and unlabored, clear to auscultation bilaterally. GI: Soft, nontender, nondistended, BS + x 4. MS: no deformity or  atrophy. Skin: warm and dry, no rash. Neuro:  Strength and sensation are intact. Psych: Normal affect.  Accessory Clinical Findings    ECG -regular sinus rhythm, 82, nonspecific T changes.  Assessment & Plan    1.  Paroxysmal supraventricular tachycardia: Recently noted on 2-week event monitoring.  At her last visit, I reduced her metoprolol from 25 twice daily to 12.5 twice daily in the setting of orthostasis and lightheadedness.  Since then, she has done quite well.  She has not had neither palpitations nor presyncope/orthostasis.  She does report occasional episodes of feeling jittery for which, she will take a whole metoprolol instead of a half.  I asked her to check her heart rate and blood pressure during those episodes, wondering if perhaps these represent episodes of SVT.  2.  Syncope: Suspect orthostasis was playing a role.  Normal LV function by echo.  3.  Orthostatic hypotension resolved with low-dose  metoprolol.  4.  Atypical chest pain: No recurrence.  Negative Myoview in 2018.  5.  Disposition: Doing well.  Follow-up in 6 months or sooner if necessary.   Murray Hodgkins, NP 08/21/2017, 11:26 AM

## 2017-11-14 ENCOUNTER — Inpatient Hospital Stay: Payer: Medicare Other | Attending: Internal Medicine

## 2017-11-28 ENCOUNTER — Ambulatory Visit: Payer: Self-pay | Admitting: Internal Medicine

## 2017-12-27 ENCOUNTER — Ambulatory Visit: Payer: Self-pay | Admitting: Internal Medicine

## 2017-12-28 ENCOUNTER — Inpatient Hospital Stay: Payer: Medicare Other | Admitting: Internal Medicine

## 2017-12-31 ENCOUNTER — Encounter: Payer: Self-pay | Admitting: Internal Medicine

## 2017-12-31 ENCOUNTER — Inpatient Hospital Stay: Payer: Medicare Other | Attending: Internal Medicine | Admitting: Internal Medicine

## 2017-12-31 VITALS — BP 150/91 | HR 75 | Temp 98.1°F | Resp 16 | Wt 174.0 lb

## 2017-12-31 DIAGNOSIS — Z7901 Long term (current) use of anticoagulants: Secondary | ICD-10-CM | POA: Insufficient documentation

## 2017-12-31 DIAGNOSIS — I81 Portal vein thrombosis: Secondary | ICD-10-CM

## 2017-12-31 DIAGNOSIS — Z90710 Acquired absence of both cervix and uterus: Secondary | ICD-10-CM | POA: Insufficient documentation

## 2017-12-31 DIAGNOSIS — Z9079 Acquired absence of other genital organ(s): Secondary | ICD-10-CM

## 2017-12-31 DIAGNOSIS — Z86718 Personal history of other venous thrombosis and embolism: Secondary | ICD-10-CM | POA: Diagnosis present

## 2017-12-31 DIAGNOSIS — Z90722 Acquired absence of ovaries, bilateral: Secondary | ICD-10-CM

## 2017-12-31 DIAGNOSIS — Z79899 Other long term (current) drug therapy: Secondary | ICD-10-CM

## 2017-12-31 DIAGNOSIS — Z8541 Personal history of malignant neoplasm of cervix uteri: Secondary | ICD-10-CM | POA: Diagnosis not present

## 2017-12-31 NOTE — Progress Notes (Signed)
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  EGD/Colo-NEG [[place]];   # cervical cancer [screening Pap smears early-stage] status post hysterectomy; BSO.  No chemoradiation.     Cervical cancer ([place])     HISTORY OF PRESENTING ILLNESS:  April Stephenson 57 y.o.  female history of portal vein thrombosis of unclear etiology most recently on Eliquis which she stopped sometime in July 2019.  Patient had been on Eliquis for about 6 months.  Patient was recently evaluated in [place] hematology-had ultrasound of the abdomen done negative for any new clot.    Review of Systems  Constitutional: Negative for chills, diaphoresis, fever, malaise/fatigue and weight loss.  HENT: Negative for nosebleeds and sore throat.   Eyes: Negative for double vision.  Respiratory: Negative for cough, hemoptysis, sputum production, shortness of breath and wheezing.   Cardiovascular: Negative for chest pain, palpitations, orthopnea and leg swelling.  Gastrointestinal: Negative for abdominal pain, blood in stool, constipation, diarrhea, heartburn, melena, nausea and vomiting.  Genitourinary: Negative for dysuria, frequency and urgency.  Musculoskeletal: Negative for back pain and joint pain.  Skin: Negative.  Negative for itching and rash.  Neurological: Negative for dizziness, tingling, focal weakness, weakness and  headaches.  Endo/Heme/Allergies: Does not bruise/bleed easily.  Psychiatric/Behavioral: Negative for depression. The patient is not nervous/anxious and does not have insomnia.      MEDICAL HISTORY:  Past Medical History:  Diagnosis Date  . Arthritis    with longstanding foot and back pain after injuries, followed by Dr. Sharol Given  . Atypical chest pain    a. 06/2016 MV: EF 76%, HTN response, no ischemia/infarct (performed @ [facility]).  . Cancer ([place])    cervical cancer  . Depression    per Dr. Silvio Pate with psych, h/o suicide attempt at 57 y/o  . Dupuytren's contracture of left hand 2016  . GERD (gastroesophageal reflux disease)   . Impingement syndrome of left shoulder 06/16/2011  . Migraine with aura   . Orthostatic hypotension   . Portal vein thrombosis    a. unclear etiology->on eliquis.  Marland Kitchen PSVT (paroxysmal supraventricular tachycardia) ([place])    a. 05/2017 2wk Event Monitor: Intermittent SVT, fastest 190 bpm, longest 14 seconds. No significant bradycardia. Avg HR 81.  . Suicidal overdose ([place])   . Syncope    a. 04/2017 Echo: EF 55-60%, Gr1 DD, no rwma, nl RV fxn.    SURGICAL HISTORY: Past Surgical History:  Procedure Laterality Date  . ABDOMINAL HYSTERECTOMY  2000   TAH/BSO  . APPENDECTOMY    . COLONOSCOPY    . FOOT SURGERY     R foot-great toe  . ORIF RADIAL FRACTURE Bilateral 03/13/2014   Procedure: OPEN REDUCTION INTERNAL FIXATION (ORIF) RADIAL FRACTURE;  Surgeon: Dayna Barker, MD;  Location: [facility];  Service: Plastics;  Laterality: Bilateral;  . SHOULDER SURGERY     2013  . TONSILLECTOMY      SOCIAL HISTORY: lives in [place]; no smoking no  alcohol has 3 children; works for post [employer]. Social History   Socioeconomic History  . Marital status: Divorced    Spouse name: Not on file  . Number of children: Not on file  . Years of education: Not on file  . Highest education level: Not on file  Occupational History  . Not on file  Social Needs  . Financial  resource strain: Not on file  . Food insecurity:    Worry: Not on file    Inability: Not on file  . Transportation needs:    Medical: Not on file    Non-medical: Not on file  Tobacco Use  . Smoking status: Never Smoker  . Smokeless tobacco: Never Used  Substance and Sexual Activity  . Alcohol use: Yes    Alcohol/week: 0.0 standard drinks    Comment: occassionally  . Drug use: No  . Sexual activity: Yes  Lifestyle  . Physical activity:    Days per week: Not on file    Minutes per session: Not on file  . Stress: Not on file  Relationships  . Social connections:    Talks on phone: Not on file    Gets together: Not on file    Attends religious service: Not on file    Active member of club or organization: Not on file    Attends meetings of clubs or organizations: Not on file    Relationship status: Not on file  . Intimate partner violence:    Fear of current or ex partner: Not on file    Emotionally abused: Not on file    Physically abused: Not on file    Forced sexual activity: Not on file  Other Topics Concern  . Not on file  Social History Narrative   3 kids, 1 with down's, 1 with asperger's   Divorced   Works at [employer], former Counselling psychologist     FAMILY HISTORY: Family History  Problem Relation Age of Onset  . Depression Mother   . Colon cancer Mother   . Kidney cancer Mother   . Heart disease Father   . Stroke Father   . Depression Brother     ALLERGIES:  is allergic to diclofenac sodium; diclofenac; tramadol; venlafaxine; and voltaren  [diclofenac sodium].  MEDICATIONS:  Current Outpatient Medications  Medication Sig Dispense Refill  . apixaban (ELIQUIS) 5 MG TABS tablet Take 1 tablet (5 mg total) by mouth 2 (two) times daily. 60 tablet 4  . diazepam (VALIUM) 5 MG tablet Take 5 mg by mouth at bedtime.  0  . diclofenac sodium (VOLTAREN) 1 % GEL     . HYDROcodone-acetaminophen (NORCO) 10-325 MG per tablet Take 1 tablet by mouth 3 (three) times daily.     . methocarbamol (ROBAXIN) 500 MG tablet Take 500 mg by mouth every 8 (eight) hours as needed for muscle spasms.     . metoprolol tartrate (LOPRESSOR) 25 MG tablet Take 0.5 tablets (12.5 mg total) by mouth 2 (two) times daily. 45 tablet 3  . ranitidine (ZANTAC) 150 MG tablet Take 150 mg by mouth 2 (two) times daily as needed (acid reflux).    . triamcinolone cream (KENALOG) 0.1 % Apply 1 application topically 2 (two) times daily as needed. 30 g 0  . VITAMIN E PO Take 500 mg by mouth daily.    Marland Kitchen zolpidem (AMBIEN) 10 MG tablet Take 1 tablet (10 mg total) by mouth at bedtime. 6 tablet 0   No current facility-administered medications for  this visit.       Marland Kitchen  PHYSICAL EXAMINATION: ECOG PERFORMANCE STATUS: 0 - Asymptomatic  Vitals:   12/31/17 1500 12/31/17 1504  BP: (!) 150/91   Pulse: 75   Resp: 16   Temp:  98.1 F (36.7 C)   Filed Weights   12/31/17 1500  Weight: 174 lb (78.9 kg)   Physical Exam  Constitutional: She is oriented to person, place, and time and well-developed, well-nourished, and in no distress.  HENT:  Head: Normocephalic and atraumatic.  Mouth/Throat: Oropharynx is clear and moist. No oropharyngeal exudate.  Eyes: Pupils are equal, round, and reactive to light.  Neck: Normal range of motion. Neck supple.  Cardiovascular: Normal rate and regular rhythm.  Pulmonary/Chest: No respiratory distress. She has no wheezes.  Abdominal: Soft. Bowel sounds are normal. She exhibits no distension and no mass. There is no tenderness. There is no rebound and no guarding.  Musculoskeletal: Normal range of motion. She exhibits no edema or tenderness.  Neurological: She is alert and oriented to person, place, and time.  Skin: Skin is warm.  Psychiatric: Affect normal.     LABORATORY DATA:  I have reviewed the data as listed Lab Results  Component Value Date   WBC 6.2 07/11/2017   HGB 13.9 07/11/2017   HCT 39.0 07/11/2017   MCV 94.6 07/11/2017   PLT 189 07/11/2017    Recent Labs    03/16/17 1816 03/30/17 1156  NA 139 139  K 3.6 4.1  CL 106 105  CO2 21* 29  GLUCOSE 125* 116*  BUN 19 19  CREATININE 1.07* 0.91  CALCIUM 9.0 9.3  GFRNONAA 57*  --   GFRAA >60  --   PROT 7.2 7.6  ALBUMIN 4.1 4.4  AST 16 7  ALT 18 13  ALKPHOS 87 65  BILITOT 0.7 0.3    RADIOGRAPHIC STUDIES: I have personally reviewed the radiological images as listed and agreed with the findings in the report. No results found.  ASSESSMENT & PLAN:   Portal vein thrombosis # Symptomatic portal vein thrombosis- [March 23, 2017]-on Eliquis for 6 months.  Stopped in July 2019.  [facility] ultrasound negative as per patient.  [facility] will have the results faxed orders.  #Given the history of prior miscarriages/indeterminate APS testing-await results of the testing at the [place].  Discussed that if it is positive-the APS is a serious disease which would need lifelong anticoagulation.  Patient understands the seriousness of the disease.  If the testing is negative-recommend continued surveillance/would not recommend any anticoagulation.  Await APS testing from [place]- pt will call [facility].   # follow up as needed-Dr.B  Cc; Dr.Duncan  All questions were answered. The patient knows to call the clinic with any problems, questions or concerns.     Cammie Sickle, MD 12/31/2017 4:40 PM

## 2017-12-31 NOTE — Assessment & Plan Note (Addendum)
#   Symptomatic portal vein thrombosis- [March 23, 2017]-on Eliquis for 6 months.  Stopped in July 2019.  VA ultrasound negative as per patient.  Await [facility]osph[facility]-patient will have the results faxed orders.  #Given the history of prior miscarriages/indeterminate APS testing-await results of the testing at the [place].  Discussed that if it is positive-the APS is a serious disease which would need lifelong anticoagulation.  Patient understands the seriousness of the disease.  If the testing is negative-recommend continued surveillance/would not recommend any anticoagulation.  Await APS testing from [place]- pt will call [facility].   # follow up as needed-Dr.B  Cc; Dr.Duncan

## 2018-01-01 ENCOUNTER — Telehealth: Payer: Self-pay | Admitting: *Deleted

## 2018-01-01 NOTE — Telephone Encounter (Signed)
Spoke to pt who reports that she hit her head on the table at a restaurant on Saturday 11/9. She states she has a headache "that's not major," denies any  blurred vision, dizziness or torn skin. She hit her head on the ceiling fan a few years ago, and beside that place, she feel an "indention" from her head hitting the table. Site is not currently bleeding or swollen. Pt scheduled for 11/14 at her request. Advised pt that should she worsen, or not feel any better before her appt, pls give [place] a call or go directly to the ED.

## 2018-01-02 NOTE — Telephone Encounter (Signed)
Noted. Thanks. Agreed.  

## 2018-01-03 ENCOUNTER — Ambulatory Visit (INDEPENDENT_AMBULATORY_CARE_PROVIDER_SITE_OTHER): Payer: Medicare Other | Admitting: Family Medicine

## 2018-01-03 ENCOUNTER — Encounter: Payer: Self-pay | Admitting: Family Medicine

## 2018-01-03 DIAGNOSIS — S060X0A Concussion without loss of consciousness, initial encounter: Secondary | ICD-10-CM | POA: Diagnosis not present

## 2018-01-03 NOTE — Progress Notes (Signed)
She is off anticoagulation and she is going to get the [place] records to Dr. B with hematology.  I'll defer for now.  D/w pt.  She agrees.   Separated as of 2019, d/w pt.  Safe at home.    She was bending down to pick up some money and on the way back up hit her head on a laminate table.  This was 12/29/17.  Denies assault.  No LOC.  She saw stars at the time.  No bruising, no bleeding.    She has a ridge on her head, unclear duration, only recently noted.    Photophobia- no Phonophobia- no Balance is a little unsteady since the event, but not falls.    Hearing is still at baseline.   No FCNAVD.    Meds, vitals, and allergies reviewed.   ROS: Per HPI unless specifically indicated in ROS section   GEN: nad, alert and oriented HEENT: mucous membranes moist NECK: supple w/o LA CV: rrr PULM: ctab, no inc wob ABD: soft, +bs EXT: no edema SKIN: no acute rash Scalp with really minimal L of midline contour change on the bone.  This is mild enough to where I probably would not have noted it otherwise, unless she had specifically mentioned it.  No local bruising or skin changes otherwise.  CN 2-12 wnl B, S/S/DTR wnl x4 Normal testing except for difficultly but not total failure of heel to toe walking.

## 2018-01-03 NOTE — Patient Instructions (Signed)
Presumed concussion.  Goals are brain and physical rest.   Avoid noise.   If it interesting, fun, or requires any effort, then don't do it.  "Shut it down" for the next few days.   This should get better.  If you continue to have troubles then let me know.    Gradually return to activity when feeling better.    Take care.  Glad to see you.

## 2018-01-04 DIAGNOSIS — S060X9A Concussion with loss of consciousness of unspecified duration, initial encounter: Secondary | ICD-10-CM | POA: Insufficient documentation

## 2018-01-04 DIAGNOSIS — S060XAA Concussion with loss of consciousness status unknown, initial encounter: Secondary | ICD-10-CM | POA: Insufficient documentation

## 2018-01-04 NOTE — Assessment & Plan Note (Signed)
Presume.  She has a normal neurologic exam except for heel-to-toe testing.  She has difficultly but not total failure of heel to toe walking.  Discussed concussion pathophysiology.  At this point she does not appear to need imaging, given that the event was 5 days ago.  We can get imaging work-up further if her symptoms do not continue to improve.  She agrees to plan.  Okay for outpatient follow-up.  See after visit summary.

## 2018-01-08 ENCOUNTER — Telehealth: Payer: Self-pay | Admitting: Family Medicine

## 2018-01-08 NOTE — Telephone Encounter (Signed)
-----   Message from Josetta Huddle, [place] sent at 01/07/2018 11:52 AM EST ----- Regarding: RE: Please get update on patient, recent concussion.  Ask about her balance. Headache is much better,balance is improving.  Overall much better than when she was in for the last OV.  Patient advised to call if improvement does not continue and certainly if worse. ----- Message ----- From: Tonia Ghent, MD Sent: 01/07/2018 To: Josetta Huddle, CMA Subject: Please get update on patient, recent concuss#

## 2018-01-08 NOTE — Telephone Encounter (Signed)
Noted. Thanks.

## 2018-03-18 ENCOUNTER — Ambulatory Visit (INDEPENDENT_AMBULATORY_CARE_PROVIDER_SITE_OTHER): Payer: Medicare Other | Admitting: Family Medicine

## 2018-03-18 ENCOUNTER — Encounter: Payer: Self-pay | Admitting: Family Medicine

## 2018-03-18 DIAGNOSIS — M25473 Effusion, unspecified ankle: Secondary | ICD-10-CM | POA: Diagnosis not present

## 2018-03-18 NOTE — Progress Notes (Signed)
Ankle swelling, B.  Started last week when she was in [place], walking more than normal.  Didn't start on the plane trip on the way to [place].  Clearly better now, has been elevating feet in the meantime.  She didn't have a known salt load in diet.  No CP, SOB.  Not SOB laying flat.    Mammogram and Pap done at [facility] in summer of 2019.  I asked her to get me a copy of her results.  She said she would work on that.  Tetanus is up to date per patient report, done at [place].   Flu shot encouraged.   Meds, vitals, and allergies reviewed.   ROS: Per HPI unless specifically indicated in ROS section   GEN: nad, alert and oriented HEENT: mucous membranes moist NECK: supple w/o LA CV: rrr PULM: ctab, no inc wob ABD: soft, +bs EXT: trace BLE edema SKIN: no acute rash 37cm calf B Calf not tender bilaterally.  No bands or cords.  No erythema or bruising.

## 2018-03-18 NOTE — Patient Instructions (Addendum)
Please get me a copy of your [facility] mamm[facility].    I would give this a little more time and continue to elevate your feet.  I think this is going to self resolve and the chance of multiple concurrent clots would be low enough to just observe for now.   Take care.  Glad to see you.

## 2018-03-20 DIAGNOSIS — M25473 Effusion, unspecified ankle: Secondary | ICD-10-CM | POA: Insufficient documentation

## 2018-03-20 NOTE — Assessment & Plan Note (Signed)
She is already improved in the meantime I would give this a little more time and continue to elevate both feet.  I think this is going to self resolve and the chance of multiple concurrent clots would be low enough to just observe for now.  She agrees with plan.  Update me as needed.

## 2018-05-21 ENCOUNTER — Telehealth: Payer: Self-pay | Admitting: Cardiovascular Disease

## 2018-05-21 NOTE — Telephone Encounter (Signed)
Patient overdue for fu   Declined visit at this time.  Seeing cards at [facility].  Per patient request deleting recall.

## 2018-09-19 ENCOUNTER — Ambulatory Visit: Payer: Self-pay | Admitting: Family Medicine

## 2018-09-19 NOTE — Telephone Encounter (Signed)
Noted.  I'll await ER notes.

## 2018-09-19 NOTE — Telephone Encounter (Signed)
Magda Paganini RN from [facility] transferred pt to me for triaging and disposition. For 2 wks pt has fainted for a few seconds x 3; last time fainted was yesterday. Each time pt has hit floor with carpet,wood or tile. Pt has no known injury. Pt lives alone. pts son witnessed the fainting episode 2 wks ago. No H/A,dizziness, CP or SOB. Pt does have weakness in lt arm, tingling sensation like is asleep. Pt can grasp cup and this started 2 wks ago. No slurred speech, no vision changes and no N&V. But 1 month ago pt started with having to think about what she was going to say before speaking. Pt has no covid symptoms, no travel and no known exposure to + covid. Pt had S/T 3 wks ago; no S/T since. Spoke with Dr Damita Dunnings and pt should go to ED for eval and possible imaging. Pt said would go to [facility] ED.  I asked pt if she had someone to drive her to ED and pt said she would drive herself; explained why that was not a good idea since you don't know when you might faint. Pt said she does not want ambulance and refused me calling 911. Pt will drive and if feels bad or like going to faint will pull over and call 911. FYI to Dr Damita Dunnings.

## 2018-09-19 NOTE — Telephone Encounter (Signed)
Pt. Reports she started "passing out 2 weeks ago." "I don't go completely out, I can hear what's going on around me." Last fainting episode was yesterday at home. She was alone. " Woke up on the floor." Denies any injury. Warm transfer to [facility] in the practice.  Answer Assessment - Initial Assessment Questions 1. ONSET: "How long were you unconscious?" (minutes) "When did it happen?"     2 weeks- started    Out x 2 minutes 2. CONTENT: "What happened during period of unconsciousness?" (e.g., seizure activity)      No 3. MENTAL STATUS: "Alert and oriented now?" (oriented x 3 = name, month, location)      Alert now 4. TRIGGER: "What do you think caused the fainting?" "What were you doing just before you fainted?"  (e.g., exercise, sudden standing up, prolonged standing)     No 5. RECURRENT SYMPTOM: "Have you ever passed out before?" If so, ask: "When was the last time?" and "What happened that time?"      Yes - happened 2 years ago 6. INJURY: "Did you sustain any injury during the fall?"      No 7. CARDIAC SYMPTOMS: "Have you had any of the following symptoms: chest pain, difficulty breathing, palpitations?"     No 8. NEUROLOGIC SYMPTOMS: "Have you had any of the following symptoms: headache, numbness, vertigo, weakness?"     No 9. GI SYMPTOMS: "Have you had any of the following symptoms: abdominal pain, vomiting, diarrhea, blood in stools?"     No 10. OTHER SYMPTOMS: "Do you have any other symptoms?"       No 11. PREGNANCY: "Is there any chance you are pregnant?" "When was your last menstrual period?"       No  Protocols used: [facility]

## 2018-10-29 ENCOUNTER — Telehealth: Payer: Self-pay | Admitting: Family Medicine

## 2018-10-29 NOTE — Telephone Encounter (Signed)
FYI.  Patient is going to the [place] testing site to be tested, She is traveling to [place] in the beginning of October and she must be tested before she can leave

## 2018-10-29 NOTE — Telephone Encounter (Signed)
Noted. Thanks.  Will await the results.  

## 2018-11-20 ENCOUNTER — Other Ambulatory Visit: Payer: Self-pay

## 2018-11-20 DIAGNOSIS — Z20822 Contact with and (suspected) exposure to covid-19: Secondary | ICD-10-CM

## 2018-11-21 LAB — NOVEL CORONAVIRUS, NAA: SARS-CoV-2, NAA: NOT DETECTED

## 2019-01-21 ENCOUNTER — Other Ambulatory Visit: Payer: Self-pay

## 2019-01-21 ENCOUNTER — Encounter: Payer: Self-pay | Admitting: Family Medicine

## 2019-01-21 ENCOUNTER — Ambulatory Visit (INDEPENDENT_AMBULATORY_CARE_PROVIDER_SITE_OTHER): Payer: Medicare Other | Admitting: Family Medicine

## 2019-01-21 ENCOUNTER — Ambulatory Visit: Payer: Federal, State, Local not specified - PPO | Admitting: Family Medicine

## 2019-01-21 VITALS — BP 146/112 | HR 105 | Temp 97.9°F | Resp 16 | Ht 67.0 in | Wt 169.0 lb

## 2019-01-21 DIAGNOSIS — I10 Essential (primary) hypertension: Secondary | ICD-10-CM

## 2019-01-21 DIAGNOSIS — M7061 Trochanteric bursitis, right hip: Secondary | ICD-10-CM

## 2019-01-21 MED ORDER — METHYLPREDNISOLONE ACETATE 40 MG/ML IJ SUSP
40.0000 mg | Freq: Once | INTRAMUSCULAR | Status: AC
  Administered 2019-01-21: 15:00:00 40 mg via INTRA_ARTICULAR

## 2019-01-21 NOTE — Assessment & Plan Note (Signed)
Pt reports that she is suppose to start lisinopril as soon as it is filled from the pharmacy. BP high today - suspect chronic + pain. Encouraged starting lisinopril

## 2019-01-21 NOTE — Patient Instructions (Addendum)
Bursitis - Steroid injection today - May hurt more for for the next 1-2 days - if you notice swelling or redness in the skin this may be a sign of infection  Try not to do too much activity today But moving around would be helpful

## 2019-01-21 NOTE — Progress Notes (Signed)
Subjective:     April Stephenson is a 58 y.o. female presenting for Hip Pain (x 2 days. right hip/muscle pain. No injury to the area.)     Hip Pain  There was no injury mechanism. The pain is present in the right hip (lateral hip). The quality of the pain is described as stabbing. The pain is severe. The pain has been constant since onset. Pertinent negatives include no inability to bear weight. She reports no foreign bodies present. The symptoms are aggravated by movement and weight bearing. She has tried acetaminophen, NSAIDs, heat, non-weight bearing and rest (bending over, sees a pain doctor for hydrocodone) for the symptoms.   Gets hydrocodone from a pain doctor and ran out due to this pain Tylenol/ibuprofen w/o improvement   #HTN - working with her [place] doctor to help her BP - elevated at home 130-180/75-114 - recently stopped metoprolol - has lisinopril which she will start soon  Review of Systems   Social History   Tobacco Use  Smoking Status Never Smoker  Smokeless Tobacco Never Used        Objective:    BP Readings from Last 3 Encounters:  01/21/19 (!) 146/112  03/18/18 (!) 150/102  01/03/18 122/86   Wt Readings from Last 3 Encounters:  01/21/19 169 lb (76.7 kg)  03/18/18 173 lb 8 oz (78.7 kg)  01/03/18 176 lb 12 oz (80.2 kg)    BP (!) 146/112   Pulse (!) 105   Temp 97.9 F (36.6 C)   Resp 16   Ht 5\' 7"  (1.702 m)   Wt 169 lb (76.7 kg)   SpO2 97%   BMI 26.47 kg/m    Physical Exam Constitutional:      General: She is not in acute distress.    Appearance: She is well-developed. She is not diaphoretic.  HENT:     Right Ear: External ear normal.     Left Ear: External ear normal.     Nose: Nose normal.  Eyes:     Conjunctiva/sclera: Conjunctivae normal.  Neck:     Musculoskeletal: Neck supple.  Cardiovascular:     Rate and Rhythm: Normal rate and regular rhythm.     Heart sounds: No murmur.  Pulmonary:     Effort: Pulmonary effort is  normal. No respiratory distress.     Breath sounds: Normal breath sounds. No wheezing.  Musculoskeletal:     Comments: Hip:  Inspection: no swelling or erythema Palpation: TTP along the lateral hip and greater trochanter area ROM: pain on the lateral hip with any movement, neg FABER Strength: some decreased glute strength  Skin:    General: Skin is warm and dry.     Capillary Refill: Capillary refill takes less than 2 seconds.  Neurological:     Mental Status: She is alert. Mental status is at baseline.  Psychiatric:        Mood and Affect: Mood normal.        Behavior: Behavior normal.           Assessment & Plan:   Problem List Items Addressed This Visit      Cardiovascular and Mediastinum   Hypertension    Pt reports that she is suppose to start lisinopril as soon as it is filled from the pharmacy. BP high today - suspect chronic + pain. Encouraged starting lisinopril      Relevant Medications   lisinopril (ZESTRIL) 10 MG tablet     Musculoskeletal and Integument  Greater trochanteric bursitis of right hip - Primary    Injection as below. Discussed NSAIDs for treatment or PT if not improving. Infectious signs discussed        Aspiration/Injection Procedure Note PRYNCESS SUEN 1960-12-22 01/21/19  Procedure: Large Joint Aspiration / Injection of Hip, Trochanteric Bursa Indications: Pain  Procedure Details Verbal consent obtained. Risks (including infection, potential atrophy), benefits, and alternatives reviewed. Greater trochanter sterilely prepped with Chloraprep. Ethyl Chloride used for anesthesia. 5 cc of Lidocaine 1% injected with 1 mL of Depo-Medrol 40 mg into trochanteric bursa at area of maximal tenderness at greater trochanter. Needle taken to bone to troch bursa, flows easily. Bursa massaged. No bleeding and no complications. Decreased pain after injection. Needle: 22 gauge spinal needle     Return if symptoms worsen or fail to improve.   Lesleigh Noe, MD

## 2019-01-21 NOTE — Assessment & Plan Note (Signed)
Injection as below. Discussed NSAIDs for treatment or PT if not improving. Infectious signs discussed

## 2019-02-03 ENCOUNTER — Other Ambulatory Visit: Payer: Self-pay

## 2019-02-03 ENCOUNTER — Encounter: Payer: Self-pay | Admitting: Family Medicine

## 2019-02-03 ENCOUNTER — Ambulatory Visit (INDEPENDENT_AMBULATORY_CARE_PROVIDER_SITE_OTHER): Payer: Medicare Other | Admitting: Family Medicine

## 2019-02-03 VITALS — BP 172/100 | HR 102 | Temp 97.4°F | Ht 67.0 in | Wt 171.3 lb

## 2019-02-03 DIAGNOSIS — M5430 Sciatica, unspecified side: Secondary | ICD-10-CM | POA: Diagnosis not present

## 2019-02-03 DIAGNOSIS — I10 Essential (primary) hypertension: Secondary | ICD-10-CM | POA: Diagnosis not present

## 2019-02-03 LAB — BASIC METABOLIC PANEL
BUN: 12 mg/dL (ref 6–23)
CO2: 25 mEq/L (ref 19–32)
Calcium: 9.1 mg/dL (ref 8.4–10.5)
Chloride: 105 mEq/L (ref 96–112)
Creatinine, Ser: 0.84 mg/dL (ref 0.40–1.20)
GFR: 69.38 mL/min (ref >60.00)
Glucose, Bld: 117 mg/dL — ABNORMAL HIGH (ref 70–99)
Potassium: 3.5 mEq/L (ref 3.5–5.1)
Sodium: 139 mEq/L (ref 135–145)

## 2019-02-03 MED ORDER — PREDNISONE 20 MG PO TABS: ORAL_TABLET | ORAL | 0 refills | Status: DC

## 2019-02-03 MED ORDER — LISINOPRIL 10 MG PO TABS: 10.0000 mg | ORAL_TABLET | Freq: Every day | ORAL | Status: DC

## 2019-02-03 NOTE — Patient Instructions (Signed)
Go to the lab on the way out.  We'll contact you with your lab report. Start prednisone with food.  Increase lisinopril to 20mg  a day for now.  Update me about your BP at the end of the week.   Cut back to 10mg  if lightheaded (when pain is better).  Take care.  Glad to see you.

## 2019-02-03 NOTE — Progress Notes (Signed)
This visit occurred during the SARS-CoV-2 public health emergency.  Safety protocols were in place, including screening questions prior to the visit, additional usage of staff PPE, and extensive cleaning of exam room while observing appropriate contact time as indicated for disinfecting solutions.  Hip pain, right.  Still on hydrocodone at baseline.  She is walking with limp.  She had prev injection w/o much relief.  Yesterday was the first day with any improvement but still with sig pain. No L sided pain.  No trauma.  No FCNAVD.  Hydrocodone "takes the edge off a little" and she is on that med at baseline.  Flexeril helped her back but not her hip pain.  Now she has pain down to the R foot.  She has some burning in the R foot, but can still feel o/w.  No BLE weakness.    BP likely up from pain.  She is now on lisinopril.  [facility]. Has been on med for about 2-3 days.  See after visit summary.  She has noted occasional memory lapses without red flag events.  She is alert and oriented x3 today.  She has 3 out of 3 on attention and recall.  She can do math and read a clock.  We agreed that she will observe and update me as needed.  Meds, vitals, and allergies reviewed.   ROS: Per HPI unless specifically indicated in ROS section   GEN: nad, alert and oriented HEENT: ncat NECK: supple w/o LA CV: rrr.  EXT: no edema SKIN: no acute rash Tender to palpation near the right greater trochanteric bursa.  Normal internal rotation of the hip but right straight leg raise is positive for radicular symptoms.  Left straight leg raise is negative.  Able to bear weight.

## 2019-02-06 DIAGNOSIS — M5430 Sciatica, unspecified side: Secondary | ICD-10-CM | POA: Insufficient documentation

## 2019-02-06 NOTE — Assessment & Plan Note (Addendum)
Increase lisinopril to 20mg  a day for now.  She will update me about BP at the end of the week.   She can cut back to 10mg  if lightheaded (when pain is better).  See notes on labs.

## 2019-02-06 NOTE — Assessment & Plan Note (Signed)
She could have greater trochanteric bursitis but she also has a straight leg raise that is positive with radicular symptoms.  Discussed steroid caution.  Start prednisone.  Update me as needed.  She agrees.  No weakness.  Still okay for outpatient follow-up.

## 2019-04-23 ENCOUNTER — Other Ambulatory Visit: Payer: Self-pay

## 2019-04-23 ENCOUNTER — Ambulatory Visit (INDEPENDENT_AMBULATORY_CARE_PROVIDER_SITE_OTHER)
Admission: RE | Admit: 2019-04-23 | Discharge: 2019-04-23 | Disposition: A | Discharge status: Home / Self Care | Payer: Medicare Other | Source: Ambulatory Visit | Attending: Family Medicine | Admitting: Family Medicine

## 2019-04-23 ENCOUNTER — Encounter: Payer: Self-pay | Admitting: Family Medicine

## 2019-04-23 ENCOUNTER — Ambulatory Visit (INDEPENDENT_AMBULATORY_CARE_PROVIDER_SITE_OTHER): Payer: Medicare Other | Admitting: Family Medicine

## 2019-04-23 VITALS — BP 120/84 | HR 105 | Temp 98.4°F | Ht 67.0 in | Wt 171.0 lb

## 2019-04-23 DIAGNOSIS — S92334A Nondisplaced fracture of third metatarsal bone, right foot, initial encounter for closed fracture: Secondary | ICD-10-CM

## 2019-04-23 DIAGNOSIS — M79671 Pain in right foot: Secondary | ICD-10-CM | POA: Diagnosis not present

## 2019-04-23 NOTE — Progress Notes (Signed)
April Federici T. Cadynce Garrette, MD Primary Care and [place] at [facility] [place], [address] Phone: [phone]  FAX: [place] - 59 y.o. female  MRN [id number]  Date of Birth: 03/12/1960  Visit Date: 04/23/2019  PCP: Tonia Ghent, MD  Referred by: Tonia Ghent, MD  Chief Complaint  Patient presents with  . Foot Pain    Right    This visit occurred during the SARS-CoV-2 public health emergency.  Safety protocols were in place, including screening questions prior to the visit, additional usage of staff PPE, and extensive cleaning of exam room while observing appropriate contact time as indicated for disinfecting solutions.   Subjective:   April Stephenson is a 59 y.o. very pleasant female patient who presents with the following:  Swollen for about 5-6 days.  No warmth.   She does not recall any specific trauma but her brother did have a large tree limb fall on his head last week.  She did with another family member move this quite rapidly with a surge of adrenaline.  She also does full care for her mother who is 18 years old and bedridden.  Review of Systems is noted in the HPI, as appropriate  Objective:   BP 120/84   Pulse (!) 105   Temp 98.4 F (36.9 C) (Temporal)   Ht 5\' 7"  (1.702 m)   Wt 171 lb (77.6 kg)   SpO2 98%   BMI 26.78 kg/m   GEN: No acute distress; alert,appropriate. PULM: Brathing comfortably in no respiratory distress PSYCH: Normally interactive.   The entirety of the tibia and fibula are nontender.  The hindfoot and midfoot are nontender.  All toes are nontender with excellent movement.  She does have pain along the metatarsal shafts 2 through 4.  This is also quite swollen.  Laboratory and Imaging Data:  XR, 3 view, foot series Indication: foot pain Findings: She has a nondisplaced third distal metatarsal shaft fracture.  No other evidence for fracture in the  entirety of the forefoot. Electronically Signed  By: Owens Loffler, MD On: 04/23/2019  8:20 AM EST   Assessment and Plan:     ICD-10-CM   1. Closed nondisplaced fracture of third metatarsal bone of right foot, initial encounter  S92.334A   2. Acute pain of right foot  M79.671 DG Foot Complete Right   Third metatarsal fracture.  I placed the patient in a postoperative shoe.  I would anticipate that she will do well.  Follow-up 1 month.  Bone density test would be of value in the future.  Follow-up: Return in about 1 month (around 05/24/2019).  No orders of the defined types were placed in this encounter.  Medications Discontinued During This Encounter  Medication Reason  . lisinopril (ZESTRIL) 10 MG tablet Completed Course  . predniSONE (DELTASONE) 20 MG tablet Completed Course   Orders Placed This Encounter  Procedures  . DG Foot Complete Right    Signed,  Lionardo Haze T. Fritzi Scripter, MD   Outpatient Encounter Medications as of 04/23/2019  Medication Sig  . amLODipine (NORVASC) 10 MG tablet Take 10 mg by mouth daily.  . cyclobenzaprine (FLEXERIL) 10 MG tablet   . diclofenac sodium (VOLTAREN) 1 % GEL   . famotidine (PEPCID) 20 MG tablet Take 20 mg by mouth 2 (two) times daily as needed for heartburn or indigestion.  Marland Kitchen HYDROcodone-acetaminophen (NORCO) 10-325 MG per tablet Take 1 tablet by mouth  3 (three) times daily.  Marland Kitchen triamcinolone cream (KENALOG) 0.1 % Apply 1 application topically 2 (two) times daily as needed.  Marland Kitchen VITAMIN E PO Take 500 mg by mouth daily.  Marland Kitchen zolpidem (AMBIEN) 10 MG tablet Take 1 tablet (10 mg total) by mouth at bedtime.  . [DISCONTINUED] lisinopril (ZESTRIL) 10 MG tablet Take 1-2 tablets (10-20 mg total) by mouth daily.  . [DISCONTINUED] predniSONE (DELTASONE) 20 MG tablet Take 2 a day for 5 days, then 1 a day for 5 days, with food. Don't take with aleve/ibuprofen.   No facility-administered encounter medications on file as of 04/23/2019.

## 2019-05-27 NOTE — Progress Notes (Signed)
April Stephenson T. April Calk, MD, [place] at [facility] [place], [address]  Phone: [phone]  FAX: [place] - 59 y.o. female  MRN [id number]  Date of Birth: [birth year]  Date: 05/29/2019  PCP: April Ghent, MD  Referral: April Ghent, MD  Chief Complaint  Patient presents with  . Follow-up    Right foot fx    This visit occurred during the SARS-CoV-2 public health emergency.  Safety protocols were in place, including screening questions prior to the visit, additional usage of staff PPE, and extensive cleaning of exam room while observing appropriate contact time as indicated for disinfecting solutions.   Subjective:   April Stephenson is a 32 y.o. very pleasant female patient with Body mass index is 26.08 kg/m. who presents with the following:  There is a follow-up office visit where I saw the patient on April 23, 2019.  At that point I found that she had a third metatarsal shaft fracture and placed her in a postoperative shoe.  She is here today in follow-up about this.:   Her pain is decreased, and she is having much less of a limp.  She was only able to tolerate her postoperative shoe for 4 days, but she is in a rigid new balance shoe now.  There is minimal to no bending of this.  Review of Systems is noted in the HPI, as appropriate   Objective:   BP 90/70   Pulse 95   Temp 98.3 F (36.8 C) (Temporal)   Ht 5\' 7"  (1.702 m)   Wt 166 lb 8 oz (75.5 kg)   SpO2 98%   BMI 26.08 kg/m   GEN: No acute distress; alert,appropriate. PULM: Breathing comfortably in no respiratory distress PSYCH: Normally interactive.   The entirety of the midfoot, hindfoot, and ankle is nontender.  All phalanges are nontender.  Shafts 1, 2, 4 5 are all entirely nontender.  She does still have some tenderness along the third metatarsal shaft  Radiology: DG Foot  Complete Right  Result Date: 04/23/2019 CLINICAL DATA:  Right forefoot pain.  Possible injury. EXAM: RIGHT FOOT COMPLETE - 3+ VIEW COMPARISON:  None. FINDINGS: The patient has a nondisplaced fracture through the distal diaphysis of the third metatarsal. No other acute bony or joint abnormality is identified. A single wire projects over the mid aspect of the proximal phalanx of the great toe consistent with prior surgery. There is some soft tissue swelling about the foot. IMPRESSION: Nondisplaced fracture distal diaphysis right third metatarsal. Electronically Signed   By: Inge Rise M.D.   On: 04/23/2019 13:54   Assessment and Plan:     ICD-10-CM   1. Closed nondisplaced fracture of third metatarsal bone of right foot, initial encounter  S92.334A DG Foot Complete Right   She is making an excellent amount of callus on her third distal metatarsal.  She still is clinically having some pain and pain on palpation.  Gait is much improved, but not fully back to baseline.  I expect that she is going to do well  Follow-up: Return in about 3 weeks (around 06/19/2019).  No orders of the defined types were placed in this encounter.  Medications Discontinued During This Encounter  Medication Reason  . HYDROcodone-acetaminophen (NORCO) 10-325 MG per tablet Completed Course  . diclofenac sodium (VOLTAREN) 1 % GEL Completed Course   Orders Placed This Encounter  Procedures  . DG Foot Complete Right    Signed,  Anoop Hemmer T. Chella Chapdelaine, MD   Outpatient Encounter Medications as of 05/29/2019  Medication Sig  . amLODipine (NORVASC) 10 MG tablet Take 10 mg by mouth daily.  . cyclobenzaprine (FLEXERIL) 10 MG tablet   . famotidine (PEPCID) 20 MG tablet Take 20 mg by mouth 2 (two) times daily as needed for heartburn or indigestion.  . hydrochlorothiazide (HYDRODIURIL) 25 MG tablet Take 25 mg by mouth daily.  . sertraline (ZOLOFT) 100 MG tablet Take 100 mg by mouth daily.  Marland Kitchen triamcinolone cream  (KENALOG) 0.1 % Apply 1 application topically 2 (two) times daily as needed.  Marland Kitchen VITAMIN E PO Take 500 mg by mouth daily.  Marland Kitchen zolpidem (AMBIEN) 10 MG tablet Take 1 tablet (10 mg total) by mouth at bedtime.  . [DISCONTINUED] diclofenac sodium (VOLTAREN) 1 % GEL   . [DISCONTINUED] HYDROcodone-acetaminophen (NORCO) 10-325 MG per tablet Take 1 tablet by mouth 3 (three) times daily.   No facility-administered encounter medications on file as of 05/29/2019.

## 2019-05-28 ENCOUNTER — Other Ambulatory Visit: Payer: Self-pay

## 2019-05-29 ENCOUNTER — Ambulatory Visit (INDEPENDENT_AMBULATORY_CARE_PROVIDER_SITE_OTHER): Payer: Medicare Other | Admitting: Family Medicine

## 2019-05-29 ENCOUNTER — Encounter: Payer: Self-pay | Admitting: Family Medicine

## 2019-05-29 ENCOUNTER — Ambulatory Visit: Payer: Federal, State, Local not specified - PPO | Admitting: Family Medicine

## 2019-05-29 ENCOUNTER — Ambulatory Visit (INDEPENDENT_AMBULATORY_CARE_PROVIDER_SITE_OTHER)
Admission: RE | Admit: 2019-05-29 | Discharge: 2019-05-29 | Disposition: A | Discharge status: Home / Self Care | Payer: Medicare Other | Source: Ambulatory Visit | Attending: Family Medicine | Admitting: Family Medicine

## 2019-05-29 VITALS — BP 90/70 | HR 95 | Temp 98.3°F | Ht 67.0 in | Wt 166.5 lb

## 2019-05-29 DIAGNOSIS — S92334A Nondisplaced fracture of third metatarsal bone, right foot, initial encounter for closed fracture: Secondary | ICD-10-CM

## 2019-06-23 ENCOUNTER — Other Ambulatory Visit: Payer: Self-pay

## 2019-06-23 ENCOUNTER — Encounter: Payer: Self-pay | Admitting: Family Medicine

## 2019-06-23 ENCOUNTER — Ambulatory Visit (INDEPENDENT_AMBULATORY_CARE_PROVIDER_SITE_OTHER): Payer: Medicare Other | Admitting: Family Medicine

## 2019-06-23 VITALS — BP 130/90 | HR 102 | Temp 97.8°F | Ht 67.0 in | Wt 171.0 lb

## 2019-06-23 DIAGNOSIS — S92334A Nondisplaced fracture of third metatarsal bone, right foot, initial encounter for closed fracture: Secondary | ICD-10-CM | POA: Diagnosis not present

## 2019-06-23 NOTE — Progress Notes (Signed)
April Stephenson T. April Burda, MD, [place] at [facility] [place], [address]  Phone: [phone]  FAX: [place] - 59 y.o. female  MRN [id number]  Date of Birth: 07/26/1960  Date: 06/23/2019  PCP: Tonia Ghent, MD  Referral: Tonia Ghent, MD  Chief Complaint  Patient presents with  . Follow-up    Right foot fx    This visit occurred during the SARS-CoV-2 public health emergency.  Safety protocols were in place, including screening questions prior to the visit, additional usage of staff PPE, and extensive cleaning of exam room while observing appropriate contact time as indicated for disinfecting solutions.   Subjective:   April Stephenson is a 21 y.o. very pleasant female patient with Body mass index is 26.78 kg/m. who presents with the following:  F/u 3 MT shaft fx.  She is here in follow-up for acute right-sided metatarsal shaft fracture.  I originally saw her on April 23, 2019.  She has been in a stiff shoe since then, and she is doing quite well at this point.  Her prior films from her last office visit in April she did show significant amount of callus at her third metatarsal shaft fracture.  Review of Systems is noted in the HPI, as appropriate   Objective:   BP 130/90   Pulse (!) 102   Temp 97.8 F (36.6 C) (Temporal)   Ht 5\' 7"  (1.702 m)   Wt 171 lb (77.6 kg)   SpO2 98%   BMI 26.78 kg/m   GEN: No acute distress; alert,appropriate. PULM: Breathing comfortably in no respiratory distress PSYCH: Normally interactive.    Nontender throughout all bony anatomy of the right forefoot, midfoot, hindfoot, and malleoli, tibia, and fibula.  Range of motion is full and strength is 5/5 in all directions  Neurovascular intact.  Radiology: DG Foot Complete Right  Result Date: 05/29/2019 CLINICAL DATA:  Follow-up fracture. EXAM: RIGHT FOOT COMPLETE -  3+ VIEW COMPARISON:  04/23/2019 FINDINGS: Exuberant callus now surrounds the fracture of the distal right third metatarsal. Metatarsal fracture alignment is unchanged with minor, approximately 1 mm, of lateral displacement. No new fractures.  No bone lesions. Joints are normally spaced and aligned. IMPRESSION: 1. Interval partial healing of the fracture of the distal right third metatarsal with significant surrounding callus formation. No change in fracture alignment. Electronically Signed   By: Lajean Manes M.D.   On: 05/29/2019 14:32    Assessment and Plan:     ICD-10-CM   1. Closed nondisplaced fracture of third metatarsal bone of right foot, initial encounter  S92.334A    Clinically she is healed.  Effectively no limitations except for what brings her pain.  Advanced activity as able increasing 10 %/day.  If she does have increased pain then back off and try the next day.  Follow.  As needed.  No orders of the defined types were placed in this encounter.  Medications Discontinued During This Encounter  Medication Reason  . amLODipine (NORVASC) 10 MG tablet Completed Course  . triamcinolone cream (KENALOG) 0.1 % Completed Course  . amLODipine (NORVASC) 5 MG tablet Side effect (s)  . hydrochlorothiazide (HYDRODIURIL) 25 MG tablet Patient Preference   No orders of the defined types were placed in this encounter.   Signed,  Maud Deed. Charline Hoskinson, MD   Outpatient Encounter Medications as of 06/23/2019  Medication Sig  . cyclobenzaprine (  FLEXERIL) 10 MG tablet   . famotidine (PEPCID) 20 MG tablet Take 20 mg by mouth 2 (two) times daily as needed for heartburn or indigestion.  . sertraline (ZOLOFT) 100 MG tablet Take 100 mg by mouth daily.  Marland Kitchen VITAMIN E PO Take 500 mg by mouth daily.  Marland Kitchen zolpidem (AMBIEN) 10 MG tablet Take 1 tablet (10 mg total) by mouth at bedtime.  . [DISCONTINUED] amLODipine (NORVASC) 10 MG tablet Take 10 mg by mouth daily.  . [DISCONTINUED] amLODipine (NORVASC) 5 MG  tablet Take 5 mg by mouth daily.  . [DISCONTINUED] hydrochlorothiazide (HYDRODIURIL) 25 MG tablet Take 25 mg by mouth daily.  . [DISCONTINUED] triamcinolone cream (KENALOG) 0.1 % Apply 1 application topically 2 (two) times daily as needed.   No facility-administered encounter medications on file as of 06/23/2019.

## 2019-07-05 ENCOUNTER — Encounter (HOSPITAL_COMMUNITY): Payer: Self-pay | Admitting: Emergency Medicine

## 2019-07-05 ENCOUNTER — Emergency Department (HOSPITAL_COMMUNITY): Payer: Medicare Other

## 2019-07-05 ENCOUNTER — Emergency Department (HOSPITAL_COMMUNITY)
Admission: EM | Admit: 2019-07-05 | Discharge: 2019-07-05 | Disposition: A | Discharge status: Home / Self Care | Payer: Medicare Other | Attending: Emergency Medicine | Admitting: Emergency Medicine

## 2019-07-05 ENCOUNTER — Other Ambulatory Visit: Payer: Self-pay

## 2019-07-05 DIAGNOSIS — M25571 Pain in right ankle and joints of right foot: Secondary | ICD-10-CM | POA: Diagnosis not present

## 2019-07-05 DIAGNOSIS — M25572 Pain in left ankle and joints of left foot: Secondary | ICD-10-CM | POA: Insufficient documentation

## 2019-07-05 DIAGNOSIS — Z8541 Personal history of malignant neoplasm of cervix uteri: Secondary | ICD-10-CM | POA: Insufficient documentation

## 2019-07-05 DIAGNOSIS — F1290 Cannabis use, unspecified, uncomplicated: Secondary | ICD-10-CM | POA: Insufficient documentation

## 2019-07-05 DIAGNOSIS — Z79899 Other long term (current) drug therapy: Secondary | ICD-10-CM | POA: Insufficient documentation

## 2019-07-05 DIAGNOSIS — R4182 Altered mental status, unspecified: Secondary | ICD-10-CM | POA: Diagnosis present

## 2019-07-05 LAB — BASIC METABOLIC PANEL
Anion gap: 7 (ref 5–15)
BUN: 8 mg/dL (ref 6–20)
CO2: 27 mmol/L (ref 22–32)
Calcium: 8.4 mg/dL — ABNORMAL LOW (ref 8.9–10.3)
Chloride: 104 mmol/L (ref 98–111)
Creatinine, Ser: 0.87 mg/dL (ref 0.44–1.00)
GFR calc Af Amer: >60 mL/min (ref >60)
GFR calc non Af Amer: >60 mL/min (ref >60)
Glucose, Bld: 122 mg/dL — ABNORMAL HIGH (ref 70–99)
Potassium: 3.7 mmol/L (ref 3.5–5.1)
Sodium: 138 mmol/L (ref 135–145)

## 2019-07-05 LAB — CBC WITH DIFFERENTIAL/PLATELET
Abs Immature Granulocytes: 0.04 10*3/uL (ref 0.00–0.07)
Basophils Absolute: 0.1 10*3/uL (ref 0.0–0.1)
Basophils Relative: 1 %
Eosinophils Absolute: 0.3 10*3/uL (ref 0.0–0.5)
Eosinophils Relative: 2 %
HCT: 36.3 % (ref 36.0–46.0)
Hemoglobin: 12.2 g/dL (ref 12.0–15.0)
Immature Granulocytes: 0 %
Lymphocytes Relative: 20 %
Lymphs Abs: 2.2 10*3/uL (ref 0.7–4.0)
MCH: 31.4 pg (ref 26.0–34.0)
MCHC: 33.6 g/dL (ref 30.0–36.0)
MCV: 93.6 fL (ref 80.0–100.0)
Monocytes Absolute: 0.9 10*3/uL (ref 0.1–1.0)
Monocytes Relative: 9 %
Neutro Abs: 7.3 10*3/uL (ref 1.7–7.7)
Neutrophils Relative %: 68 %
Platelets: 221 10*3/uL (ref 150–400)
RBC: 3.88 MIL/uL (ref 3.87–5.11)
RDW: 12.4 % (ref 11.5–15.5)
WBC: 10.8 10*3/uL — ABNORMAL HIGH (ref 4.0–10.5)
nRBC: 0 % (ref 0.0–0.2)

## 2019-07-05 LAB — CBG MONITORING, ED: Glucose-Capillary: 132 mg/dL — ABNORMAL HIGH (ref 70–99)

## 2019-07-05 MED ORDER — ONDANSETRON HCL 4 MG/2ML IJ SOLN
4.0000 mg | Freq: Once | INTRAMUSCULAR | Status: AC
  Administered 2019-07-05: 4 mg via INTRAVENOUS
  Filled 2019-07-05: qty 2

## 2019-07-05 MED ORDER — KETOROLAC TROMETHAMINE 30 MG/ML IJ SOLN
15.0000 mg | Freq: Once | INTRAMUSCULAR | Status: AC
  Administered 2019-07-05: 15 mg via INTRAVENOUS
  Filled 2019-07-05: qty 1

## 2019-07-05 MED ORDER — SODIUM CHLORIDE 0.9 % IV BOLUS
1000.0000 mL | Freq: Once | INTRAVENOUS | Status: AC
  Administered 2019-07-05: 1000 mL via INTRAVENOUS

## 2019-07-05 MED ORDER — ACETAMINOPHEN 500 MG PO TABS
1000.0000 mg | ORAL_TABLET | Freq: Once | ORAL | Status: AC
  Administered 2019-07-05: 1000 mg via ORAL
  Filled 2019-07-05: qty 2

## 2019-07-05 NOTE — ED Notes (Signed)
While assisting pt with d/c pt lost balance and sat on bed. Pt stated she did not want any more assessments at this time. Pt verbalized understanding of d/c instructions, follow up care and s/s requiring return to ed. Pt had no additional questions at this time and was transported to exit via wheelchair.

## 2019-07-05 NOTE — Discharge Instructions (Signed)
Take 4 over the counter ibuprofen tablets 3 times a day or 2 over-the-counter naproxen tablets twice a day for pain. Also take tylenol 1000mg(2 extra strength) four times a day.    

## 2019-07-05 NOTE — ED Provider Notes (Signed)
[facility] EMERGENCY DEPARTMENT Provider Note   CSN: [id number] Arrival date & time: 07/05/19  2035     History Chief Complaint  Patient presents with  . Altered Mental Status  . Ankle Pain    April Stephenson is a 59 y.o. female.  51 yo F with a chief complaints of left ankle pain.  Patient states that she hurt her ankle while dancing earlier today.  She was also sent in for some confusion.  After some discussion it sounds like the patient has taken some marijuana edibles.  She is a bit upset that her friend called an ambulance for her.  She is worried about her ankle and says that it hurts.  Hurts worse on the lateral aspect.  States that she is actually hurt both her ankles recently.  Stepped into a hole with the right foot about a week or so ago and then had an episode today with one of her shoes.  She denies other area of injury.  Denies pain at the knee.  The history is provided by the patient.  Altered Mental Status Associated symptoms: no fever, no headaches, no nausea, no palpitations and no vomiting   Ankle Pain Associated symptoms: no fever   Illness Severity:  Moderate Onset quality:  Gradual Duration:  1 day Timing:  Constant Progression:  Unchanged Chronicity:  New Associated symptoms: no chest pain, no congestion, no fever, no headaches, no myalgias, no nausea, no rhinorrhea, no shortness of breath, no vomiting and no wheezing        Past Medical History:  Diagnosis Date  . Arthritis    with longstanding foot and back pain after injuries, followed by Dr. Sharol Given  . Atypical chest pain    a. 06/2016 MV: EF 76%, HTN response, no ischemia/infarct (performed @ [facility]).  . Cancer ([place])    cervical cancer  . Depression    per Dr. Silvio Pate with psych, h/o suicide attempt at 59 y/o  . Dupuytren's contracture of left hand 2016  . GERD (gastroesophageal reflux disease)   . Impingement syndrome of left shoulder 06/16/2011  . Migraine with aura    . Orthostatic hypotension   . Portal vein thrombosis    a. unclear etiology->on eliquis.  Marland Kitchen PSVT (paroxysmal supraventricular tachycardia) ([place])    a. 05/2017 2wk Event Monitor: Intermittent SVT, fastest 190 bpm, longest 14 seconds. No significant bradycardia. Avg HR 81.  . Suicidal overdose ([place])   . Syncope    a. 04/2017 Echo: EF 55-60%, Gr1 DD, no rwma, nl RV fxn.    Patient Active Problem List   Diagnosis Date Noted  . Sciatica 02/06/2019  . Ankle edema 03/20/2018  . Concussion 01/04/2018  . Bruising 07/18/2017  . Portal vein thrombosis 04/01/2017  . Syncope 04/01/2017  . Chronic pain due to trauma 03/23/2017  . Hypertension 03/23/2017  . Rectal fullness 03/23/2017  . Abdominal pain 03/19/2017  . Greater trochanteric bursitis of right hip 01/27/2016  . Rash and nonspecific skin eruption 04/22/2015  . Wrist fracture, bilateral 03/12/2014  . Radius and ulna distal fracture 03/12/2014  . Tremor 02/26/2014  . Rhabdomyolysis 07/31/2013  . Chronic  intermittant diarrhea 01/09/2013  . Quadriceps strain 10/04/2011  . Impingement syndrome of left shoulder 06/16/2011  . Frozen shoulder 11/22/2010  . Depression 08/11/2010  . Back pain 08/11/2010  . Migraine 08/11/2010  . Foot pain, right 08/11/2010  . Cervical cancer ([place]) 08/11/2010  . Insomnia 08/11/2010    Past Surgical History:  Procedure Laterality Date  . ABDOMINAL HYSTERECTOMY  2000   TAH/BSO  . APPENDECTOMY    . COLONOSCOPY    . FOOT SURGERY     R foot-great toe  . ORIF RADIAL FRACTURE Bilateral 03/13/2014   Procedure: OPEN REDUCTION INTERNAL FIXATION (ORIF) RADIAL FRACTURE;  Surgeon: Dayna Barker, MD;  Location: [facility];  Service: Plastics;  Laterality: Bilateral;  . SHOULDER SURGERY     2013  . TONSILLECTOMY       OB History   No obstetric history on file.     Family History  Problem Relation Age of Onset  . Depression Mother   . Colon cancer Mother   . Kidney cancer Mother   . Heart disease Father    . Stroke Father   . Depression Brother     Social History   Tobacco Use  . Smoking status: Never Smoker  . Smokeless tobacco: Never Used  Substance Use Topics  . Alcohol use: Yes    Alcohol/week: 0.0 standard drinks    Comment: occassionally  . Drug use: Yes    Frequency: 1.0 times per week    Types: Marijuana    Comment: Edibles     Home Medications Prior to Admission medications   Medication Sig Start Date End Date Taking? Authorizing Provider  Ascorbic Acid (VITAMIN C PO) Take 1 tablet by mouth daily.   Yes [provider]  Calcium Carbonate-Vitamin D3 (CALCIUM 600-D) 600-400 MG-UNIT TABS Take 1 tablet by mouth in the morning and at bedtime.   Yes [provider]  Cholecalciferol (VITAMIN D3 PO) Take 1 tablet by mouth daily.   Yes [provider]  cyclobenzaprine (FLEXERIL) 10 MG tablet Take 10 mg by mouth 3 (three) times daily as needed for muscle spasms.  01/20/19  Yes [provider]  famotidine (PEPCID) 20 MG tablet Take 20 mg by mouth 2 (two) times daily as needed for heartburn or indigestion.   Yes [provider]  sertraline (ZOLOFT) 100 MG tablet Take 100 mg by mouth daily. 05/01/19  Yes [provider]  zolpidem (AMBIEN) 10 MG tablet Take 1 tablet (10 mg total) by mouth at bedtime. 10/05/16  Yes Tonia Ghent, MD  amLODipine (NORVASC) 5 MG tablet Take 5 mg by mouth daily.    [provider]  atorvastatin (LIPITOR) 40 MG tablet Take 40 mg by mouth at bedtime.    [provider]  hydrochlorothiazide (HYDRODIURIL) 25 MG tablet Take 25 mg by mouth daily.    [provider]    Allergies    Diclofenac sodium, Tramadol, Venlafaxine, and Metoprolol tartrate  Review of Systems   Review of Systems  Constitutional: Positive for activity change. Negative for chills and fever.  HENT: Negative for congestion and rhinorrhea.   Eyes: Negative for redness and visual disturbance.  Respiratory:  Negative for shortness of breath and wheezing.   Cardiovascular: Negative for chest pain and palpitations.  Gastrointestinal: Negative for nausea and vomiting.  Genitourinary: Negative for dysuria and urgency.  Musculoskeletal: Positive for arthralgias. Negative for myalgias.  Skin: Negative for pallor and wound.  Neurological: Negative for dizziness and headaches.    Physical Exam Updated Vital Signs BP (!) 149/84   Pulse (!) 103   Temp 99.8 F (37.7 C) (Oral)   Resp 19   Ht 5\' 7"  (1.702 m)   Wt 81.6 kg   SpO2 98%   BMI 28.19 kg/m   Physical Exam Vitals and nursing note reviewed.  Constitutional:  General: She is not in acute distress.    Appearance: She is well-developed. She is not diaphoretic.  HENT:     Head: Normocephalic and atraumatic.  Eyes:     Pupils: Pupils are equal, round, and reactive to light.  Cardiovascular:     Rate and Rhythm: Normal rate and regular rhythm.     Heart sounds: No murmur. No friction rub. No gallop.   Pulmonary:     Effort: Pulmonary effort is normal.     Breath sounds: No wheezing or rales.  Abdominal:     General: There is no distension.     Palpations: Abdomen is soft.     Tenderness: There is no abdominal tenderness.  Musculoskeletal:        General: No tenderness.     Cervical back: Normal range of motion and neck supple.     Comments: Pain to the left lateral ankle at the area of the ATF.  No fibular neck tenderness.   The patient also has some edema and tenderness to the right ankle in the same area.  Not as acutely tender.  Skin:    General: Skin is warm and dry.  Neurological:     Mental Status: She is alert and oriented to person, place, and time.  Psychiatric:        Behavior: Behavior normal.     ED Results / Procedures / Treatments   Labs (all labs ordered are listed, but only abnormal results are displayed) Labs Reviewed  CBC WITH DIFFERENTIAL/PLATELET - Abnormal; Notable for the following components:       Result Value   WBC 10.8 (*)    All other components within normal limits  BASIC METABOLIC PANEL - Abnormal; Notable for the following components:   Glucose, Bld 122 (*)    Calcium 8.4 (*)    All other components within normal limits  CBG MONITORING, ED - Abnormal; Notable for the following components:   Glucose-Capillary 132 (*)    All other components within normal limits    EKG None  Radiology DG Ankle Complete Left  Result Date: 07/05/2019 CLINICAL DATA:  Ankle pain post fall EXAM: LEFT ANKLE COMPLETE - 3+ VIEW COMPARISON:  None. FINDINGS: There is no evidence of fracture, dislocation, or joint effusion. There is no evidence of arthropathy or other focal bone abnormality. Soft tissue swelling seen over the lateral malleolus. IMPRESSION: No acute osseous abnormality. Electronically Signed   By: Prudencio Pair M.D.   On: 07/05/2019 21:27   DG Ankle Complete Right  Result Date: 07/05/2019 CLINICAL DATA:  Right ankle pain following dancing EXAM: RIGHT ANKLE - COMPLETE 3+ VIEW COMPARISON:  Foot radiographs 05/29/2019 FINDINGS: Soft tissue swelling about the ankle as well as more diffuse mild soft tissue edema. No sizable effusion. No acute bony abnormality. Specifically, no fracture, subluxation, or dislocation. There is a healing posttraumatic deformity at the distal third metatarsal shaft with some persistent fracture lucency on a background of more exuberant callus formation. Finding similar to comparison foot radiograph 05/29/2019. IMPRESSION: 1. No acute osseous abnormality or traumatic malalignment of the ankle. Mild swelling without effusion. 2. Healing posttraumatic deformity at the distal third metatarsal shaft with some persistent fracture lucency on a background of more exuberant callus formation. Finding similar to comparison foot radiograph 05/29/2019. Electronically Signed   By: Lovena Le M.D.   On: 07/05/2019 21:29    Procedures Procedures (including critical care  time)  Medications Ordered in ED Medications  sodium chloride 0.9 %  bolus 1,000 mL (1,000 mLs Intravenous New Bag/Given 07/05/19 2123)  acetaminophen (TYLENOL) tablet 1,000 mg (1,000 mg Oral Given 07/05/19 2126)  ketorolac (TORADOL) 30 MG/ML injection 15 mg (15 mg Intravenous Given 07/05/19 2124)  ondansetron (ZOFRAN) injection 4 mg (4 mg Intravenous Given 07/05/19 2124)    ED Course  I have reviewed the triage vital signs and the nursing notes.  Pertinent labs & imaging results that were available during my care of the patient were reviewed by me and considered in my medical decision making (see chart for details).    MDM Rules/Calculators/A&P                      59 yo F with a cc of left ankle pain. Going on for about an hour or so. Also concerned for change in her mental status though the patient admits that she had been eating marijuana edibles and so I think that is the most likely cause.  She is mildly tachycardic and so we will give her a bolus of IV fluids and check basic lab work.  Tachycardia has resolved.  Patient is feeling better after her bag of fluids and some pain management.  Lab work also without concerning finding.  Plain film of bilateral ankles was obtained as the patient was a bit confused about which one was injured.  Both viewed by me and are negative for fracture.  Will place in a splint.  Give her crutches to take home for when she is a bit more sober and can use them.  We will have her follow-up with her family doctor.  10:47 PM:  I have discussed the diagnosis/risks/treatment options with the patient and believe the pt to be eligible for discharge home to follow-up with PCP. We also discussed returning to the ED immediately if new or worsening sx occur. We discussed the sx which are most concerning (e.g., sudden worsening pain, fever, inability to tolerate by mouth) that necessitate immediate return. Medications administered to the patient during their visit and any  new prescriptions provided to the patient are listed below.  Medications given during this visit Medications  sodium chloride 0.9 % bolus 1,000 mL (1,000 mLs Intravenous New Bag/Given 07/05/19 2123)  acetaminophen (TYLENOL) tablet 1,000 mg (1,000 mg Oral Given 07/05/19 2126)  ketorolac (TORADOL) 30 MG/ML injection 15 mg (15 mg Intravenous Given 07/05/19 2124)  ondansetron (ZOFRAN) injection 4 mg (4 mg Intravenous Given 07/05/19 2124)     The patient appears reasonably screen and/or stabilized for discharge and I doubt any other medical condition or other [facility] requiring further screening, evaluation, or treatment in the ED at this time prior to discharge.    Final Clinical Impression(s) / ED Diagnoses Final diagnoses:  Marijuana use  Acute bilateral ankle pain    Rx / DC Orders ED Discharge Orders    None       Deno Etienne, DO 07/05/19 2247

## 2019-07-05 NOTE — ED Triage Notes (Signed)
Pt arrived via [facility] from home. Pt c/o right ankle pain following dancing. Per EMS daughter called 911 due to pt "talking out of her head" Per daughter pt has recently been non compliant with medication and eat marijuana edibles. EDP at bedside

## 2019-07-06 ENCOUNTER — Emergency Department (HOSPITAL_COMMUNITY)
Admission: EM | Admit: 2019-07-06 | Discharge: 2019-07-06 | Disposition: A | Discharge status: Home / Self Care | Payer: Medicare Other | Attending: Emergency Medicine | Admitting: Emergency Medicine

## 2019-07-06 ENCOUNTER — Encounter (HOSPITAL_COMMUNITY): Payer: Self-pay | Admitting: *Deleted

## 2019-07-06 ENCOUNTER — Emergency Department (HOSPITAL_COMMUNITY): Payer: Medicare Other

## 2019-07-06 ENCOUNTER — Other Ambulatory Visit: Payer: Self-pay

## 2019-07-06 DIAGNOSIS — R410 Disorientation, unspecified: Secondary | ICD-10-CM | POA: Insufficient documentation

## 2019-07-06 DIAGNOSIS — Z20822 Contact with and (suspected) exposure to covid-19: Secondary | ICD-10-CM | POA: Insufficient documentation

## 2019-07-06 DIAGNOSIS — Z79899 Other long term (current) drug therapy: Secondary | ICD-10-CM | POA: Diagnosis not present

## 2019-07-06 DIAGNOSIS — R4182 Altered mental status, unspecified: Secondary | ICD-10-CM | POA: Diagnosis present

## 2019-07-06 LAB — URINALYSIS, ROUTINE W REFLEX MICROSCOPIC
Bilirubin Urine: NEGATIVE
Glucose, UA: NEGATIVE mg/dL
Hgb urine dipstick: NEGATIVE
Ketones, ur: NEGATIVE mg/dL
Leukocytes,Ua: NEGATIVE
Nitrite: NEGATIVE
Protein, ur: NEGATIVE mg/dL
Specific Gravity, Urine: 1.017 (ref 1.005–1.030)
pH: 5 (ref 5.0–8.0)

## 2019-07-06 LAB — COMPREHENSIVE METABOLIC PANEL
ALT: 23 U/L (ref 0–44)
AST: 24 U/L (ref 15–41)
Albumin: 2.9 g/dL — ABNORMAL LOW (ref 3.5–5.0)
Alkaline Phosphatase: 106 U/L (ref 38–126)
Anion gap: 9 (ref 5–15)
BUN: 11 mg/dL (ref 6–20)
CO2: 27 mmol/L (ref 22–32)
Calcium: 8.1 mg/dL — ABNORMAL LOW (ref 8.9–10.3)
Chloride: 104 mmol/L (ref 98–111)
Creatinine, Ser: 0.97 mg/dL (ref 0.44–1.00)
GFR calc Af Amer: >60 mL/min (ref >60)
GFR calc non Af Amer: >60 mL/min (ref >60)
Glucose, Bld: 124 mg/dL — ABNORMAL HIGH (ref 70–99)
Potassium: 3.7 mmol/L (ref 3.5–5.1)
Sodium: 140 mmol/L (ref 135–145)
Total Bilirubin: 0.9 mg/dL (ref 0.3–1.2)
Total Protein: 6.1 g/dL — ABNORMAL LOW (ref 6.5–8.1)

## 2019-07-06 LAB — RAPID URINE DRUG SCREEN, HOSP PERFORMED
Amphetamines: NOT DETECTED
Barbiturates: NOT DETECTED
Benzodiazepines: NOT DETECTED
Cocaine: NOT DETECTED
Opiates: NOT DETECTED
Tetrahydrocannabinol: POSITIVE — AB

## 2019-07-06 LAB — CBC WITH DIFFERENTIAL/PLATELET
Abs Immature Granulocytes: 0.02 10*3/uL (ref 0.00–0.07)
Basophils Absolute: 0.1 10*3/uL (ref 0.0–0.1)
Basophils Relative: 1 %
Eosinophils Absolute: 0.4 10*3/uL (ref 0.0–0.5)
Eosinophils Relative: 4 %
HCT: 36.7 % (ref 36.0–46.0)
Hemoglobin: 12 g/dL (ref 12.0–15.0)
Immature Granulocytes: 0 %
Lymphocytes Relative: 33 %
Lymphs Abs: 3 10*3/uL (ref 0.7–4.0)
MCH: 31.7 pg (ref 26.0–34.0)
MCHC: 32.7 g/dL (ref 30.0–36.0)
MCV: 97.1 fL (ref 80.0–100.0)
Monocytes Absolute: 0.9 10*3/uL (ref 0.1–1.0)
Monocytes Relative: 10 %
Neutro Abs: 4.6 10*3/uL (ref 1.7–7.7)
Neutrophils Relative %: 52 %
Platelets: 236 10*3/uL (ref 150–400)
RBC: 3.78 MIL/uL — ABNORMAL LOW (ref 3.87–5.11)
RDW: 12.7 % (ref 11.5–15.5)
WBC: 8.9 10*3/uL (ref 4.0–10.5)
nRBC: 0 % (ref 0.0–0.2)

## 2019-07-06 LAB — AMMONIA: Ammonia: 19 umol/L (ref 9–35)

## 2019-07-06 LAB — I-STAT BETA HCG BLOOD, ED (MC, WL, AP ONLY): I-stat hCG, quantitative: <5 m[IU]/mL (ref <5)

## 2019-07-06 LAB — SALICYLATE LEVEL: Salicylate Lvl: <7 mg/dL — ABNORMAL LOW (ref 7.0–30.0)

## 2019-07-06 LAB — ETHANOL: Alcohol, Ethyl (B): <10 mg/dL (ref <10)

## 2019-07-06 LAB — SARS CORONAVIRUS 2 BY RT PCR (HOSPITAL ORDER, PERFORMED IN ~~LOC~~ HOSPITAL LAB): SARS Coronavirus 2: NEGATIVE

## 2019-07-06 NOTE — ED Notes (Signed)
Pt ambulated in the hallway with crutches with no problems.

## 2019-07-06 NOTE — Discharge Instructions (Addendum)
Your lab results were within normal limits today.  The CT of your head did not show any acute pathology.  We determined that it was safe for you to go home the company of your brother.  You also may need to seek psychiatric evaluation in an outpatient basis.

## 2019-07-06 NOTE — ED Notes (Signed)
Pt having random conversation regarding her family, per brother their mother recently passed away, he reports pt has had intermittent episodes of random speech over the years but not like as severe as this one. he's concerned something is wrong with her medications and may need to be admitted for a psych consult.

## 2019-07-06 NOTE — ED Provider Notes (Signed)
[facility] EMERGENCY DEPARTMENT Provider Note   CSN: [id number] Arrival date & time: 07/06/19  0007     History Chief Complaint  Patient presents with  . Altered Mental Status    April Stephenson is a 59 y.o. female.  59 y.o female with a PMH of PSVT, Depression, ankle edema presents to the ED with a chief complaint of right ankle pain.  According to patient, she was seen yesterday in the emergency department in had a work-up done, reports she was not instructed on how to use her crutches.  She states that she never left the facility as she was unable to put weight on her right ankle.  According to patient after being discharged, she attempted to ambulate without her crutches and fell to the ground.  Did not strike her head, there was no other injury associated.  Patient does request instructions on crutches usage, does have a right Garro splint placed to the right ankle.  No other complaints or injuries.  The history is provided by the patient.       Past Medical History:  Diagnosis Date  . Arthritis    with longstanding foot and back pain after injuries, followed by Dr. Sharol Given  . Atypical chest pain    a. 06/2016 MV: EF 76%, HTN response, no ischemia/infarct (performed @ [facility]).  . Cancer ([place])    cervical cancer  . Depression    per Dr. Silvio Pate with psych, h/o suicide attempt at 59 y/o  . Dupuytren's contracture of left hand 2016  . GERD (gastroesophageal reflux disease)   . Impingement syndrome of left shoulder 06/16/2011  . Migraine with aura   . Orthostatic hypotension   . Portal vein thrombosis    a. unclear etiology->on eliquis.  Marland Kitchen PSVT (paroxysmal supraventricular tachycardia) ([place])    a. 05/2017 2wk Event Monitor: Intermittent SVT, fastest 190 bpm, longest 14 seconds. No significant bradycardia. Avg HR 81.  . Suicidal overdose ([place])   . Syncope    a. 04/2017 Echo: EF 55-60%, Gr1 DD, no rwma, nl RV fxn.    Patient Active Problem List    Diagnosis Date Noted  . Sciatica 02/06/2019  . Ankle edema 03/20/2018  . Concussion 01/04/2018  . Bruising 07/18/2017  . Portal vein thrombosis 04/01/2017  . Syncope 04/01/2017  . Chronic pain due to trauma 03/23/2017  . Hypertension 03/23/2017  . Rectal fullness 03/23/2017  . Abdominal pain 03/19/2017  . Greater trochanteric bursitis of right hip 01/27/2016  . Rash and nonspecific skin eruption 04/22/2015  . Wrist fracture, bilateral 03/12/2014  . Radius and ulna distal fracture 03/12/2014  . Tremor 02/26/2014  . Rhabdomyolysis 07/31/2013  . Chronic  intermittant diarrhea 01/09/2013  . Quadriceps strain 10/04/2011  . Impingement syndrome of left shoulder 06/16/2011  . Frozen shoulder 11/22/2010  . Depression 08/11/2010  . Back pain 08/11/2010  . Migraine 08/11/2010  . Foot pain, right 08/11/2010  . Cervical cancer ([place]) 08/11/2010  . Insomnia 08/11/2010    Past Surgical History:  Procedure Laterality Date  . ABDOMINAL HYSTERECTOMY  2000   TAH/BSO  . APPENDECTOMY    . COLONOSCOPY    . FOOT SURGERY     R foot-great toe  . ORIF RADIAL FRACTURE Bilateral 03/13/2014   Procedure: OPEN REDUCTION INTERNAL FIXATION (ORIF) RADIAL FRACTURE;  Surgeon: Dayna Barker, MD;  Location: [facility];  Service: Plastics;  Laterality: Bilateral;  . SHOULDER SURGERY     2013  . TONSILLECTOMY  OB History   No obstetric history on file.     Family History  Problem Relation Age of Onset  . Depression Mother   . Colon cancer Mother   . Kidney cancer Mother   . Heart disease Father   . Stroke Father   . Depression Brother     Social History   Tobacco Use  . Smoking status: Never Smoker  . Smokeless tobacco: Never Used  Substance Use Topics  . Alcohol use: Yes    Alcohol/week: 0.0 standard drinks    Comment: occassionally  . Drug use: Yes    Frequency: 1.0 times per week    Types: Marijuana    Comment: Edibles     Home Medications Prior to Admission medications    Medication Sig Start Date End Date Taking? Authorizing Provider  amLODipine (NORVASC) 5 MG tablet Take 5 mg by mouth daily.   Yes [provider]  Ascorbic Acid (VITAMIN C PO) Take 1 tablet by mouth daily.   Yes [provider]  atorvastatin (LIPITOR) 40 MG tablet Take 40 mg by mouth at bedtime.   Yes [provider]  Calcium Carbonate-Vitamin D3 (CALCIUM 600-D) 600-400 MG-UNIT TABS Take 1 tablet by mouth in the morning and at bedtime.   Yes [provider]  Cholecalciferol (VITAMIN D3 PO) Take 1 tablet by mouth daily.   Yes [provider]  cyclobenzaprine (FLEXERIL) 10 MG tablet Take 10 mg by mouth 3 (three) times daily as needed for muscle spasms.  01/20/19  Yes [provider]  famotidine (PEPCID) 20 MG tablet Take 20 mg by mouth 2 (two) times daily as needed for heartburn or indigestion.   Yes [provider]  hydrochlorothiazide (HYDRODIURIL) 25 MG tablet Take 25 mg by mouth daily.   Yes [provider]  sertraline (ZOLOFT) 100 MG tablet Take 100 mg by mouth daily. 05/01/19  Yes [provider]  zolpidem (AMBIEN) 10 MG tablet Take 1 tablet (10 mg total) by mouth at bedtime. 10/05/16  Yes Tonia Ghent, MD    Allergies    Diclofenac sodium, Tramadol, Venlafaxine, and Metoprolol tartrate  Review of Systems   Review of Systems  Constitutional: Negative for fever.  Musculoskeletal: Positive for arthralgias.    Physical Exam Updated Vital Signs BP 129/73 (BP Location: Right Arm)   Pulse 96   Temp 98.1 F (36.7 C) (Oral)   Resp 16   SpO2 100%   Physical Exam Vitals and nursing note reviewed.  Constitutional:      Appearance: Normal appearance.  HENT:     Head: Normocephalic and atraumatic.     Nose: Nose normal.     Mouth/Throat:     Mouth: Mucous membranes are moist.  Cardiovascular:     Rate and Rhythm: Normal rate.  Pulmonary:     Effort: Pulmonary effort is normal.  Abdominal:      General: Abdomen is flat.  Musculoskeletal:     Cervical back: Normal range of motion and neck supple.  Skin:    General: Skin is warm and dry.  Neurological:     Mental Status: She is alert and oriented to person, place, and time.     ED Results / Procedures / Treatments   Labs (all labs ordered are listed, but only abnormal results are displayed) Labs Reviewed  CBC WITH DIFFERENTIAL/PLATELET - Abnormal; Notable for the following components:      Result Value   RBC 3.78 (*)    All other  components within normal limits  COMPREHENSIVE METABOLIC PANEL - Abnormal; Notable for the following components:   Glucose, Bld 124 (*)    Calcium 8.1 (*)    Total Protein 6.1 (*)    Albumin 2.9 (*)    All other components within normal limits  RAPID URINE DRUG SCREEN, HOSP PERFORMED - Abnormal; Notable for the following components:   Tetrahydrocannabinol POSITIVE (*)    All other components within normal limits  SALICYLATE LEVEL - Abnormal; Notable for the following components:   Salicylate Lvl [id number] (*)    All other components within normal limits  SARS CORONAVIRUS 2 BY RT PCR (HOSPITAL ORDER, [facility])  URINALYSIS, ROUTINE W REFLEX MICROSCOPIC  ETHANOL  AMMONIA  I-STAT BETA HCG BLOOD, ED ([facility], [facility], [facility] ONLY)    EKG None  Radiology DG Ankle Complete Left  Result Date: 07/05/2019 CLINICAL DATA:  Ankle pain post fall EXAM: LEFT ANKLE COMPLETE - 3+ VIEW COMPARISON:  None. FINDINGS: There is no evidence of fracture, dislocation, or joint effusion. There is no evidence of arthropathy or other focal bone abnormality. Soft tissue swelling seen over the lateral malleolus. IMPRESSION: No acute osseous abnormality. Electronically Signed   By: Prudencio Pair M.D.   On: 07/05/2019 21:27   DG Ankle Complete Right  Result Date: 07/05/2019 CLINICAL DATA:  Right ankle pain following dancing EXAM: RIGHT ANKLE - COMPLETE 3+ VIEW COMPARISON:  Foot radiographs 05/29/2019 FINDINGS:  Soft tissue swelling about the ankle as well as more diffuse mild soft tissue edema. No sizable effusion. No acute bony abnormality. Specifically, no fracture, subluxation, or dislocation. There is a healing posttraumatic deformity at the distal third metatarsal shaft with some persistent fracture lucency on a background of more exuberant callus formation. Finding similar to comparison foot radiograph 05/29/2019. IMPRESSION: 1. No acute osseous abnormality or traumatic malalignment of the ankle. Mild swelling without effusion. 2. Healing posttraumatic deformity at the distal third metatarsal shaft with some persistent fracture lucency on a background of more exuberant callus formation. Finding similar to comparison foot radiograph 05/29/2019. Electronically Signed   By: Lovena Le M.D.   On: 07/05/2019 21:29   CT Head Wo Contrast  Result Date: 07/06/2019 CLINICAL DATA:  Altered mental status.  Unclear cause. EXAM: CT HEAD WITHOUT CONTRAST TECHNIQUE: Contiguous axial images were obtained from the base of the skull through the vertex without intravenous contrast. COMPARISON:  07/31/2013 FINDINGS: Brain: No evidence of acute infarction, hemorrhage, hydrocephalus, extra-axial collection or mass lesion/mass effect. Vascular: No hyperdense vessel or unexpected calcification. Skull: Normal. Negative for fracture or focal lesion. Sinuses/Orbits: Normal globes and orbits.  Sinuses are clear. Other: None. IMPRESSION: Normal unenhanced CT scan of the brain. Electronically Signed   By: Lajean Manes M.D.   On: 07/06/2019 09:57    Procedures Procedures (including critical care time)  Medications Ordered in ED Medications - No data to display  ED Course  I have reviewed the triage vital signs and the nursing notes.  Pertinent labs & imaging results that were available during my care of the patient were reviewed by me and considered in my medical decision making (see chart for details).    MDM  Rules/Calculators/A&P   Patient was seen in the ED yesterday for bilateral ankle pain, had a negative work-up.  She reports she never left the ED and came back today requesting further evaluation.  According to daughter who had informed her she said that patient had been acting different.  Patient is  able to provide a history however thus have tangential no speech.  Is very forgetful during conversation.  Vitals are within normal limits, she is afebrile, was provided with crutches yesterday after her ankle pain.  During interview with patient, she reports she just wanted to be thought on how to use the crutches, will obtain collateral information from daughter for further evaluation.  According to providers note which I have reviewed who saw her yesterday patient had had may be some edible Gummies with THC.  8:26 AM attempted to call daughter Fritz Pickerel at [phone]    Lab work was obtained to further evaluate any causes of her changes in mental status, CMP without any electrolyte abnormality.  Kidney function is unremarkable.  LFTs are within normal limits.  Ammonia is normal, ethanol level is normal.  Salicylate level within normal limits.  hCG is negative.  CBC without any signs of leukocytosis or anemia.  Covid is negative as well.  8:47 AM Spoke to [place] daughter who reports patient's behavior has been Marye Round is very forgetful.  Is unsure whether she is taking her medication adequately, states that she is on several psychiatric medications due to the loss of her mother patient has become more irritable, has had changes in her mental status.  Patient does have been ongoing since 6 weeks ago however they worsened over the last 24 hours.  Patient currently does live alone.  12:00 PM spoke to brother at the bedside who reported patient's changes in behavior have been ongoing for several weeks.  She does not make sense when she talks to them, they have some suspicion that she likely is mismanaging her  psychiatric medication.  Patient was able to ambulate with the help of nursing staff along with crutches that were provided for her.  Have not identified any medical reason that is causing patient's altered mental status.  I do not believe she appears altered however she is not makes sense on her history, suspect that she likely needs a psychiatric evaluation in order to see if patient has been managing medication.  Patient denies any SI, HI, visual or auditory hallucinations.  3:28 PM Spoke to patient and brother at the beside who reports remain in her mental status.  Patient and I have had a shared decision making conversation in which she is agreed to follow-up with outpatient along with outpatient psychiatry.  She has mental status has improved during her stay in the ED.  Brother does report improvement in this.  He has agreed to take patient home at this time.  I have attempted to contact daughter Mateo Flow however was unable to reach her.  Patient otherwise stable discharge, medically cleared and stable.   Portions of this note were generated with Lobbyist. Dictation errors may occur despite best attempts at proofreading.  Final Clinical Impression(s) / ED Diagnoses Final diagnoses:  Disorientation    Rx / DC Orders ED Discharge Orders    None       Janeece Fitting, PA-C 07/06/19 [place], MD 07/07/19 1517

## 2019-07-06 NOTE — TOC Initial Note (Signed)
Transition of Care [facility]) - Initial/Assessment Note    Patient Details  Name: April Stephenson MRN: [id number] Date of Birth: 1960/12/14  [facility] CM/SW Contact:    Verdell Carmine, RN Phone Number: 07/06/2019, 11:35 AM  Clinical Narrative:                 Patient to the ED earlier for ankle pain  was discharged with crutches  And apparently "fell " lowered herself to the ground speaking nonsensically. Checked back into ED. Unable to articulate what she wants. Head CT reveals NO stroke or bleed. Anticipate admission for Altered mental status with further workup and possible psych consult as well.   Mother passed away 6 months ago and she has been depressed. Asked her if she needed any equipment to assist her at home or any help She denies need for any of these services., she lives alone. Brother lives in [place]. It was stated by the nurse that the patient and a friend did have some edible mariajuana a couple of days ago. CSW and CM will follow for further needs  Expected Discharge Plan: Home/Self Care Barriers to Discharge: Continued Medical Work up   Patient Goals and CMS Choice Patient states their goals for this hospitalization and ongoing recovery are:: to go home   Choice offered to / list presented to : (patient refuusses any help at home)  Expected Discharge Plan and Services Expected Discharge Plan: Home/Self Care   Discharge Planning Services: CM Consult Post Acute Care Choice: NA(patient has crutches refusing any other DME) Living arrangements for the past 2 months: Apartment                  Will need Mental health resources Refused any home health or DME                     Prior Living Arrangements/Services Living arrangements for the past 2 months: Apartment Lives with:: Self Patient language and need for interpreter reviewed:: Yes Do you feel safe going back to the place where you live?: Yes      Need for Family Participation in  Patient Care: Yes (Comment) Care giver support system in place?: Yes (comment)   Criminal Activity/Legal Involvement Pertinent to Current Situation/Hospitalization: No - Comment as needed  Activities of Daily Living      Permission Sought/Granted                  Emotional Assessment Appearance:: Appears stated age Attitude/Demeanor/Rapport: Avoidant(MIxiing words up but grtting points across brother with patient) Affect (typically observed): Calm Orientation: : Oriented to Self Alcohol / Substance Use: Tobacco Use, Illicit Drugs Psych Involvement: No (comment)  Admission diagnosis:  RIGHT FOOT INJURY Patient Active Problem List   Diagnosis Date Noted  . Sciatica 02/06/2019  . Ankle edema 03/20/2018  . Concussion 01/04/2018  . Bruising 07/18/2017  . Portal vein thrombosis 04/01/2017  . Syncope 04/01/2017  . Chronic pain due to trauma 03/23/2017  . Hypertension 03/23/2017  . Rectal fullness 03/23/2017  . Abdominal pain 03/19/2017  . Greater trochanteric bursitis of right hip 01/27/2016  . Rash and nonspecific skin eruption 04/22/2015  . Wrist fracture, bilateral 03/12/2014  . Radius and ulna distal fracture 03/12/2014  . Tremor 02/26/2014  . Rhabdomyolysis 07/31/2013  . Chronic  intermittant diarrhea 01/09/2013  . Quadriceps strain 10/04/2011  . Impingement syndrome of left shoulder 06/16/2011  . Frozen shoulder 11/22/2010  . Depression 08/11/2010  .  Back pain 08/11/2010  . Migraine 08/11/2010  . Foot pain, right 08/11/2010  . Cervical cancer ([place]) 08/11/2010  . Insomnia 08/11/2010   PCP:  Tonia Ghent, MD Pharmacy:   [id number] - [place], [place] [address] [place] [address] Phone: [phone] Fax: [phone]     Social Determinants of Health (SDOH) Interventions   question whether she can take care of self at this time at home with ankle injury, fall and AMS   Readmission  Risk Interventions No flowsheet data found.

## 2019-07-06 NOTE — ED Triage Notes (Signed)
I dont know what the pt wants  She has multiple attempts to tell me why shes here she cannot finish a statement she cannot remember  Any events of the day  She attempted to call her daughter and her brother neither answered apparently she was seen in the ed earlier  And  She has decided that she cannot walk she complains about the bruising to her lt lower leg    confusion

## 2019-07-06 NOTE — ED Notes (Signed)
Before patient checked back in she ws found laying on the floor. Registration called over to say she did not fall she stood up and dropped to ground and started screaming for help. This NT and [place] went to help patient off the floor. She states she fell because she can't walk. Crutches were still unopened from her discharge.

## 2019-07-09 ENCOUNTER — Other Ambulatory Visit: Payer: Self-pay

## 2019-07-09 ENCOUNTER — Encounter: Payer: Self-pay | Admitting: Family Medicine

## 2019-07-09 ENCOUNTER — Ambulatory Visit (INDEPENDENT_AMBULATORY_CARE_PROVIDER_SITE_OTHER): Payer: Medicare Other | Admitting: Family Medicine

## 2019-07-09 VITALS — BP 150/100 | HR 111 | Temp 98.5°F | Ht 67.0 in | Wt 173.2 lb

## 2019-07-09 DIAGNOSIS — S93419A Sprain of calcaneofibular ligament of unspecified ankle, initial encounter: Secondary | ICD-10-CM

## 2019-07-09 DIAGNOSIS — S93499A Sprain of other ligament of unspecified ankle, initial encounter: Secondary | ICD-10-CM

## 2019-07-09 DIAGNOSIS — S93429A Sprain of deltoid ligament of unspecified ankle, initial encounter: Secondary | ICD-10-CM

## 2019-07-09 NOTE — Progress Notes (Signed)
Spencer T. Copland, MD, [place] at [facility] [place], [address]  Phone: [phone]  FAX: [place] - 59 y.o. female  MRN [id number]  Date of Birth: [birth year]  Date: 07/09/2019  PCP: Tonia Ghent, MD  Referral: Tonia Ghent, MD  Chief Complaint  Patient presents with  . Follow-up    ER Visit 07/05/2019 & 07/06/2019    This visit occurred during the SARS-CoV-2 public health emergency.  Safety protocols were in place, including screening questions prior to the visit, additional usage of staff PPE, and extensive cleaning of exam room while observing appropriate contact time as indicated for disinfecting solutions.   Subjective:   April Stephenson is a 29 y.o. very pleasant female patient with Body mass index is 27.13 kg/m. who presents with the following:  ER f/u 07/05/2019 and 07/06/2019.  I recall her quite well.  She did have a nondisplaced third metatarsal shaft fracture that had a large quantity of callus informed and was clinically completely healed the last time I saw her on Jun 23, 2019.  Final report in chart review, she did eat some THC Gummies and became unfortunately more intoxicated than anticipated.  She went to the ER on May 15 and 16.  She had some confusion and was trying to dance.  She did develop some relatively acute onset of diffuse bilateral ankle pain.  At this point she is minimally tender in the region of her prior third metatarsal shaft fracture.  Stepped in an ankle about 2 weeks ago.  This is in a setting of several months ago with a 3rd MT shaft fx.  Goten hold of some MJ from [place].  Tried to do some dance maneuvers.  Reports possible hitting of door in the ER.  Diffuse ankle pain B. ? Injury.  ATFL, deltoid, CFL sprain.  Review of Systems is noted in the HPI, as appropriate   Objective:   BP (!) 150/100    Pulse (!) 111   Temp 98.5 F (36.9 C) (Temporal)   Ht 5\' 7"  (1.702 m)   Wt 173 lb 4 oz (78.6 kg)   SpO2 97%   BMI 27.13 kg/m   GEN: No acute distress; alert,appropriate. PULM: Breathing comfortably in no respiratory distress PSYCH: Normally interactive.    Right and left foot and ankle: Nontender along all malleoli.  She does have point tenderness at the ATFL, CFL, and deltoid ligaments bilaterally.  Drawer testing is intact as well as subtalar tilt and are negative.  Nontender throughout the midfoot and forefoot.  She does have some modest swelling, but no focal bony tenderness.  Nontender at the calcaneus, fifth of the metatarsal, navicular or cuboid as well as the talus.  Radiology: DG Ankle Complete Left  Result Date: 07/05/2019 CLINICAL DATA:  Ankle pain post fall EXAM: LEFT ANKLE COMPLETE - 3+ VIEW COMPARISON:  None. FINDINGS: There is no evidence of fracture, dislocation, or joint effusion. There is no evidence of arthropathy or other focal bone abnormality. Soft tissue swelling seen over the lateral malleolus. IMPRESSION: No acute osseous abnormality. Electronically Signed   By: Prudencio Pair M.D.   On: 07/05/2019 21:27   DG Ankle Complete Right  Result Date: 07/05/2019 CLINICAL DATA:  Right ankle pain following dancing EXAM: RIGHT ANKLE - COMPLETE 3+ VIEW COMPARISON:  Foot radiographs 05/29/2019 FINDINGS: Soft tissue swelling about the ankle as well  as more diffuse mild soft tissue edema. No sizable effusion. No acute bony abnormality. Specifically, no fracture, subluxation, or dislocation. There is a healing posttraumatic deformity at the distal third metatarsal shaft with some persistent fracture lucency on a background of more exuberant callus formation. Finding similar to comparison foot radiograph 05/29/2019. IMPRESSION: 1. No acute osseous abnormality or traumatic malalignment of the ankle. Mild swelling without effusion. 2. Healing posttraumatic deformity at the distal third  metatarsal shaft with some persistent fracture lucency on a background of more exuberant callus formation. Finding similar to comparison foot radiograph 05/29/2019. Electronically Signed   By: Lovena Le M.D.   On: 07/05/2019 21:29   CT Head Wo Contrast  Result Date: 07/06/2019 CLINICAL DATA:  Altered mental status.  Unclear cause. EXAM: CT HEAD WITHOUT CONTRAST TECHNIQUE: Contiguous axial images were obtained from the base of the skull through the vertex without intravenous contrast. COMPARISON:  07/31/2013 FINDINGS: Brain: No evidence of acute infarction, hemorrhage, hydrocephalus, extra-axial collection or mass lesion/mass effect. Vascular: No hyperdense vessel or unexpected calcification. Skull: Normal. Negative for fracture or focal lesion. Sinuses/Orbits: Normal globes and orbits.  Sinuses are clear. Other: None. IMPRESSION: Normal unenhanced CT scan of the brain. Electronically Signed   By: Lajean Manes M.D.   On: 07/06/2019 09:57   The radiological images were independently reviewed by myself in the office and results were reviewed with the patient. My independent interpretation of images: Bilateral foot and ankle films are reviewed and there are no evidence of new fractures or dislocations.  On the right ankle there is some evidence of prior distal third metatarsal shaft fracture with quite a bit of callus formation. Electronically Signed  By: Owens Loffler, MD On: 07/09/2019  9:20 AM EDT   Assessment and Plan:     ICD-10-CM   1. Sprain of anterior talofibular ligament, initial encounter  S93.499A   2. Sprain of deltoid ligament of ankle, unspecified laterality, initial encounter  S93.429A   3. Sprain of calcaneofibular ligament of ankle, unspecified laterality, initial encounter  S93.419A    Level of Medical Decision-Making in this case is MODERATE.   Bilateral medial lateral ankle sprains.  She is currently walking okay with some modest limp and I think that she will do okay without  any formal bracing.  Clinically, do not think that she has disrupted her clinically healed prior third metatarsal shaft fracture.  I urged her to be more cautious, and to be mindful regarding THC in the future.  Follow-up: No follow-ups on file.  No orders of the defined types were placed in this encounter.  There are no discontinued medications. No orders of the defined types were placed in this encounter.   Signed,  Maud Deed. Copland, MD   Outpatient Encounter Medications as of 07/09/2019  Medication Sig  . amLODipine (NORVASC) 5 MG tablet Take 5 mg by mouth daily.  . Ascorbic Acid (VITAMIN C PO) Take 1 tablet by mouth daily.  Marland Kitchen atorvastatin (LIPITOR) 40 MG tablet Take 40 mg by mouth at bedtime.  . Calcium Carbonate-Vitamin D3 (CALCIUM 600-D) 600-400 MG-UNIT TABS Take 1 tablet by mouth in the morning and at bedtime.  . Cholecalciferol (VITAMIN D3 PO) Take 1 tablet by mouth daily.  . cyclobenzaprine (FLEXERIL) 10 MG tablet Take 10 mg by mouth 3 (three) times daily as needed for muscle spasms.   . famotidine (PEPCID) 20 MG tablet Take 20 mg by mouth 2 (two) times daily as needed for heartburn or indigestion.  Marland Kitchen  hydrochlorothiazide (HYDRODIURIL) 25 MG tablet Take 25 mg by mouth daily.  . sertraline (ZOLOFT) 100 MG tablet Take 100 mg by mouth daily.  Marland Kitchen zolpidem (AMBIEN) 10 MG tablet Take 1 tablet (10 mg total) by mouth at bedtime.   No facility-administered encounter medications on file as of 07/09/2019.

## 2020-04-15 ENCOUNTER — Telehealth: Payer: Self-pay | Admitting: Family Medicine

## 2020-04-15 NOTE — Telephone Encounter (Signed)
LM on voicemail returning patients call and asked that she call back at her convenience to discuss her concerns.

## 2020-04-15 NOTE — Telephone Encounter (Signed)
Patient states that she was in here to see Dr Lorelei Pont last year around May or June and she went to the ER and did a followup with him > some of the notes he put in about what happened to her was incorrect and she is asking that they be changed . Please call her back to discuss.
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
## 2020-04-19 NOTE — Telephone Encounter (Signed)
Spoke with our records department for further instruction.  Per our policy, patient must submit a written request and mail it in for record change review.   I did notify patient of this and offered to help her through the online page to access form.  She states that she would rather I print out the change request form and mail it to her with the instructions.  I have placed in the mail today.  Once she returns the request form then an investigation and review with the provider and records department to determine if change is appropriate.   FYI to Dr. Lorelei Pont.  Nothing to do at this point until patient follows through with the official request and review process.

## 2020-04-19 NOTE — Telephone Encounter (Signed)
I am very happy to do an addendum to clarify or provide any additional history.  There is extensive documentation in the medical record.

## 2021-05-06 ENCOUNTER — Ambulatory Visit (INDEPENDENT_AMBULATORY_CARE_PROVIDER_SITE_OTHER): Payer: Medicare Other | Admitting: Family Medicine

## 2021-05-06 ENCOUNTER — Encounter: Payer: Self-pay | Admitting: Family Medicine

## 2021-05-06 ENCOUNTER — Other Ambulatory Visit: Payer: Self-pay

## 2021-05-06 VITALS — BP 130/88 | HR 79 | Temp 97.2°F | Ht 66.5 in | Wt 169.8 lb

## 2021-05-06 DIAGNOSIS — K219 Gastro-esophageal reflux disease without esophagitis: Secondary | ICD-10-CM | POA: Insufficient documentation

## 2021-05-06 DIAGNOSIS — F319 Bipolar disorder, unspecified: Secondary | ICD-10-CM | POA: Diagnosis not present

## 2021-05-06 DIAGNOSIS — F339 Major depressive disorder, recurrent, unspecified: Secondary | ICD-10-CM | POA: Insufficient documentation

## 2021-05-06 DIAGNOSIS — J309 Allergic rhinitis, unspecified: Secondary | ICD-10-CM | POA: Insufficient documentation

## 2021-05-06 DIAGNOSIS — F603 Borderline personality disorder: Secondary | ICD-10-CM | POA: Insufficient documentation

## 2021-05-06 DIAGNOSIS — H6980 Other specified disorders of Eustachian tube, unspecified ear: Secondary | ICD-10-CM | POA: Diagnosis not present

## 2021-05-06 DIAGNOSIS — E782 Mixed hyperlipidemia: Secondary | ICD-10-CM | POA: Insufficient documentation

## 2021-05-06 MED ORDER — FLUTICASONE PROPIONATE 50 MCG/ACT NA SUSP: 2.0000 | Freq: Every day | NASAL | 1 refills | Status: AC

## 2021-05-06 MED ORDER — SERTRALINE HCL 50 MG PO TABS: ORAL_TABLET | ORAL | Status: AC

## 2021-05-06 MED ORDER — QUETIAPINE FUMARATE 25 MG PO TABS: ORAL_TABLET | ORAL | Status: AC

## 2021-05-06 NOTE — Progress Notes (Signed)
This visit occurred during the SARS-CoV-2 public health emergency.  Safety protocols were in place, including screening questions prior to the visit, additional usage of staff PPE, and extensive cleaning of exam room while observing appropriate contact time as indicated for disinfecting solutions. ? ?Parking form done for patient. Limited walking, would qualify.  Form done. ? ?She had prev tremor with Ambien, resolved off med.  Discussed.  Allergy list updated. ? ?Depression hx noted.   She described an episode about 2017 likely manic sx, higher energy, spending money she didn't have.  It wasn't substance induced.  It went on for days at a time.  Her mood is good in the meantime.  Current medications discussed with patient. ? ?She had R ear pain about 1 month ago.  She was seen at clinic out of town.  She had tinnitis at the time.  Was rx'd abx.  Still with ear ringing.   ?Then went to [place] about 1 week later.  Rx'd amoxil.  ST resolved.  Ear pain resolved.  Still with ear congestion and tinnitus.  Tinnitus only noted since recent episode.  Muffled hearing R ear.  No L ear sx.   ? ?Meds, vitals, and allergies reviewed.  ? ?ROS: Per HPI unless specifically indicated in ROS section  ? ?Nad ?Ncat ?TM with normal inspection bilaterally.  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  No TM movement on valsalva.  She has intact bone conduction bilaterally. ?Neck supple, no LA ?Rrr ?Ctab ?Speech normal.  Judgment appears intact.  No tremor. ?

## 2021-05-06 NOTE — Patient Instructions (Addendum)
Use flonase and gently try to pop your ears.   ?Likely ETD.  ?Take care.  Glad to see you. ?

## 2021-05-08 DIAGNOSIS — H6980 Other specified disorders of Eustachian tube, unspecified ear: Secondary | ICD-10-CM | POA: Insufficient documentation

## 2021-05-08 DIAGNOSIS — F319 Bipolar disorder, unspecified: Secondary | ICD-10-CM | POA: Insufficient documentation

## 2021-05-08 NOTE — Assessment & Plan Note (Signed)
She has intact bone conduction.  Reasonable to perform Valsalva gently and use Flonase.  Anatomy discussed with patient.  She can update me as needed. ?

## 2021-05-08 NOTE — Assessment & Plan Note (Signed)
She describes an episode of manic symptoms in 2017- that was consistent with mania and it was not substance-induced per patient report.  Given that a diagnosis of bipolar affective disorder is reasonable.  She is followed through psychiatry.  Discussed continuing mirtazapine quetiapine and sertraline.  No suicidal or homicidal intent.  Still okay for outpatient follow-up. ?

## 2021-08-31 ENCOUNTER — Telehealth: Payer: Self-pay | Admitting: Family Medicine

## 2021-08-31 NOTE — Telephone Encounter (Signed)
Left message for patient to call back and schedule Medicare Annual Wellness Visit (AWV) either virtually or phone   awvi 03/23/18 per [place]    This should be a 45 minute visit.  I left my direct # [phone]

## 2021-09-02 ENCOUNTER — Ambulatory Visit (INDEPENDENT_AMBULATORY_CARE_PROVIDER_SITE_OTHER): Payer: Medicare Other

## 2021-09-02 VITALS — Ht 66.5 in | Wt 178.0 lb

## 2021-09-02 DIAGNOSIS — Z0000 Encounter for general adult medical examination without abnormal findings: Secondary | ICD-10-CM | POA: Diagnosis not present

## 2021-09-02 NOTE — Progress Notes (Signed)
Subjective:   April Stephenson is a 61 y.o. female who presents for Medicare Annual (Subsequent) preventive examination.  Review of Systems    Virtual Visit via Telephone Note  I connected with  April Stephenson on 09/02/21 at  3:30 PM EDT by telephone and verified that I am speaking with the correct person using two identifiers.  Location: Patient: Home Provider: Office Persons participating in the virtual visit: patient/Nurse Health Advisor   I discussed the limitations, risks, security and privacy concerns of performing an evaluation and management service by telephone and the availability of in person appointments. The patient expressed understanding and agreed to proceed.  Interactive audio and video telecommunications were attempted between this nurse and patient, however failed, due to patient having technical difficulties OR patient did not have access to video capability.  We continued and completed visit with audio only.  Some vital signs may be absent or patient reported.   Criselda Peaches, LPN  Cardiac Risk Factors include: advanced age (>47mn, >>67women);hypertension     Objective:    Today's Vitals   09/02/21 1542  Weight: 178 lb (80.7 kg)  Height: 5' 6.5" (1.689 m)   Body mass index is 28.3 kg/m.     09/02/2021    3:51 PM 07/06/2019   10:31 AM 07/06/2019    1:35 AM 07/05/2019    8:47 PM 12/31/2017    2:56 PM 07/11/2017   11:14 AM 04/11/2017   11:28 AM  Advanced Directives  Does Patient Have a Medical Advance Directive? Yes No No No Yes No No  Type of Advance Directive Living will        Does patient want to make changes to medical advance directive? No - Patient declined    No - Patient declined    Would patient like information on creating a medical advance directive?  No - Patient declined  No - Patient declined  No - Patient declined Yes (MAU/Ambulatory/Procedural Areas - Information given)    Current Medications (verified) Outpatient Encounter  Medications as of 09/02/2021  Medication Sig   buprenorphine (SUBUTEX) 8 MG SUBL SL tablet Place under the tongue.   cyclobenzaprine (FLEXERIL) 10 MG tablet Take 10 mg by mouth 3 (three) times daily as needed for muscle spasms.    famotidine (PEPCID) 20 MG tablet Take 20 mg by mouth 2 (two) times daily as needed for heartburn or indigestion.   fluticasone (FLONASE) 50 MCG/ACT nasal spray Place 2 sprays into both nostrils daily.   mirtazapine (REMERON) 30 MG tablet TAKE ONE TABLET BY MOUTH AT BEDTIME FOR INSOMNIA   QUEtiapine (SEROQUEL) 25 MG tablet Take 1-2 tabs at night as needed for sleep.   sertraline (ZOLOFT) 50 MG tablet '50mg'$  taken by mouth daily.   No facility-administered encounter medications on file as of 09/02/2021.    Allergies (verified) Diclofenac sodium, Tramadol, Venlafaxine, Ambien [zolpidem], and Metoprolol tartrate   History: Past Medical History:  Diagnosis Date   Arthritis    with longstanding foot and back pain after injuries, followed by Dr. DSharol Given  Atypical chest pain    a. 06/2016 MV: EF 76%, HTN response, no ischemia/infarct ([facility].   Cancer ([facility]    cervical cancer   Depression    per Dr. SSilvio Patewith psych, h/o suicide attempt at 61y/o   Dupuytren's contracture of left hand 2016   GERD (gastroesophageal reflux disease)    Impingement syndrome of left shoulder 06/16/2011   Migraine with aura  Orthostatic hypotension    Portal vein thrombosis    a. unclear etiology->on eliquis.   PSVT (paroxysmal supraventricular tachycardia) ([place])    a. 05/2017 2wk Event Monitor: Intermittent SVT, fastest 190 bpm, longest 14 seconds. No significant bradycardia. Avg HR 81.   Suicidal overdose ([place])    Syncope    a. 04/2017 Echo: EF 55-60%, Gr1 DD, no rwma, nl RV fxn.   Past Surgical History:  Procedure Laterality Date   ABDOMINAL HYSTERECTOMY  2000   TAH/BSO   APPENDECTOMY     COLONOSCOPY     FOOT SURGERY     R foot-great toe   ORIF RADIAL  FRACTURE Bilateral 03/13/2014   Procedure: OPEN REDUCTION INTERNAL FIXATION (ORIF) RADIAL FRACTURE;  Surgeon: Dayna Barker, MD;  Location: [place];  Service: Plastics;  Laterality: Bilateral;   SHOULDER SURGERY     2013   TONSILLECTOMY     Family History  Problem Relation Age of Onset   Depression Mother    Colon cancer Mother    Kidney cancer Mother    Heart disease Father    Stroke Father    Depression Brother    Social History   Socioeconomic History   Marital status: Divorced    Spouse name: Not on file   Number of children: Not on file   Years of education: Not on file   Highest education level: Not on file  Occupational History   Not on file  Tobacco Use   Smoking status: Never   Smokeless tobacco: Never  Vaping Use   Vaping Use: Never used  Substance and Sexual Activity   Alcohol use: Yes    Alcohol/week: 0.0 standard drinks of alcohol    Comment: occassionally   Drug use: Yes    Frequency: 1.0 times per week    Types: Marijuana    Comment: Edibles    Sexual activity: Yes  Other Topics Concern   Not on file  Social History Narrative   3 kids, 1 with down's, 1 with asperger's   Separated as of 2019   Works at [employer], former Counselling psychologist    Social Determinants of [place] Strain: [place]  (09/02/2021)   Overall Financial Resource Strain (CARDIA)    Difficulty of Paying Living Expenses: Not hard at all  Food Insecurity: No Food Insecurity (09/02/2021)   Hunger Vital Sign    Worried About Running Out of Food in the Last Year: Never true    [place] in the Last Year: Never true  Transportation Needs: No Transportation Needs (09/02/2021)   PRAPARE - Hydrologist (Medical): No    Lack of Transportation (Non-Medical): No  Physical Activity: Inactive (09/02/2021)   Exercise Vital Sign    Days of Exercise per Week: 0 days    Minutes of Exercise per Session: 0 min  Stress: No Stress Concern Present  (09/02/2021)   [place]    Feeling of Stress : Not at all  Social Connections: Moderately Integrated (09/02/2021)   Social Connection and Isolation Panel [NHANES]    Frequency of Communication with Friends and Family: More than three times a week    Frequency of Social Gatherings with Friends and Family: More than three times a week    Attends Religious Services: More than 4 times per year    Active Member of [employer] or Organizations: Yes    Attends Club or  Organization Meetings: More than 4 times per year    Marital Status: Divorced    Tobacco Counseling Counseling given: Not Answered   Clinical Intake:  Pre-visit preparation completed: No Diabetic?  No   Activities of Daily Living    09/02/2021    3:49 PM  In your present state of health, do you have any difficulty performing the following activities:  Hearing? 0  Vision? 0  Difficulty concentrating or making decisions? 0  Walking or climbing stairs? 0  Dressing or bathing? 0  Doing errands, shopping? 0  Preparing Food and eating ? N  Using the Toilet? N  In the past six months, have you accidently leaked urine? N  Do you have problems with loss of bowel control? N  Managing your Medications? N  Managing your Finances? N  Housekeeping or managing your Housekeeping? N    Patient Care Team: Tonia Ghent, MD as PCP - General (Family Medicine) Wellington Hampshire, MD as PCP - Cardiology (Cardiology) Dayna Barker, MD as Consulting Physician (General Surgery) Care, [facility] (Pain Medicine)  Indicate any recent Medical Services you may have received from other than [facility] providers in the past year (date may be approximate).     Assessment:   This is a routine wellness examination for [place].  Hearing/Vision screen Hearing Screening - Comments:: No hearing difficulty Vision Screening - Comments:: Wears reading glasses.  Follow[facility]  Dietary issues and exercise activities discussed: Exercise limited by: None identified   Goals Addressed               This Visit's Progress     Lose 20lb (pt-stated)         Depression Screen    09/02/2021    3:47 PM  PHQ 2/9 Scores  PHQ - 2 Score 0    Fall Risk    09/02/2021    3:50 PM  [place] in the past year? 0  Number falls in past yr: 0  Injury with Fall? 0  Risk for fall due to : No Fall Risks    FALL RISK PREVENTION PERTAINING TO THE HOME:  Any stairs in or around the home? Yes  If so, are there any without handrails? No  Home free of loose throw rugs in walkways, pet beds, electrical cords, etc? Yes  Adequate lighting in your home to reduce risk of falls? Yes   ASSISTIVE DEVICES UTILIZED TO PREVENT FALLS:  Life alert? No  Use of a cane, walker or w/c? No  Grab bars in the bathroom? No  Shower chair or bench in shower? No  Elevated toilet seat or a handicapped toilet? No   TIMED UP AND GO:  Was the test performed? No . Audio Visit   Cognitive Function:        09/02/2021    3:51 PM  6CIT Screen  What Year? 0 points  What month? 0 points  What time? 0 points  Count back from 20 0 points  Months in reverse 0 points  Repeat phrase 0 points  Total Score 0 points    Immunizations Immunization History  Administered Date(s) Administered   Hep A / Hep B 02/11/2002, 09/29/2002   Tdap 02/21/2007    TDAP status: Due, Education has been provided regarding the importance of this vaccine. Advised may receive this vaccine at local pharmacy or Health Dept. Aware to provide a copy of the vaccination record if obtained from  local pharmacy or Health Dept. Verbalized acceptance and understanding.  Flu Vaccine status: Declined, Education has been provided regarding the importance of this vaccine but patient still declined. Advised may receive this vaccine at local pharmacy or Health Dept. Aware to provide a  copy of the vaccination record if obtained from local pharmacy or Health Dept. Verbalized acceptance and understanding.    Covid-19 vaccine status: Declined, Education has been provided regarding the importance of this vaccine but patient still declined. Advised may receive this vaccine at local pharmacy or Health Dept.or vaccine clinic. Aware to provide a copy of the vaccination record if obtained from local pharmacy or Health Dept. Verbalized acceptance and understanding.  Qualifies for Shingles Vaccine? Yes   Zostavax completed No   Shingrix Completed?: No.    Education has been provided regarding the importance of this vaccine. Patient has been advised to call insurance company to determine out of pocket expense if they have not yet received this vaccine. Advised may also receive vaccine at local pharmacy or Health Dept. Verbalized acceptance and understanding.  Screening Tests Health Maintenance  Topic Date Due   PAP SMEAR-Modifier  09/02/2021 (Originally 01/13/1981)   COVID-19 Vaccine (1) 09/18/2021 (Originally [birth year]/1960)   Zoster Vaccines- Shingrix (1 of 2) 12/03/2021 (Originally 01/14/1979)   MAMMOGRAM  09/03/2022 (Originally 04/06/2016)   TETANUS/TDAP  09/03/2022 (Originally 02/20/2017)   Hepatitis C Screening  09/03/2022 (Originally 01/13/1978)   HIV Screening  09/03/2022 (Originally 01/14/1975)   INFLUENZA VACCINE  09/20/2021   COLONOSCOPY (Pts 45-60yr Insurance coverage will need to be confirmed)  03/25/2027   HPV VACCINES  Aged Out    Health Maintenance  There are no preventive care reminders to display for this patient.   Colorectal cancer screening: Type of screening: Colonoscopy. Completed 03/24/17. Repeat every 10 years  Mammogram status: Ordered Patient deferred. Pt provided with contact info and advised to call to schedule appt.     Lung Cancer Screening: (Low Dose CT Chest recommended if Age 62-80years, 30 pack-year currently smoking OR have quit w/in 15years.)  does not qualify.     Additional Screening:  Hepatitis C Screening: does qualify; Completed Patient deferred  Vision Screening: Recommended annual ophthalmology exams for early detection of glaucoma and other disorders of the eye. Is the patient up to date with their annual eye exam?  Yes  Who is the provider or what is the name of the office in which the patient attends annual eye exams[facility] not [facility] with a provider, would they like to be referred to a provider to establish care? No .   Dental Screening: Recommended annual dental exams for proper oral hygiene  Community Resource Referral / Chronic Care Management:  CRR required this visit?  No   CCM required this visit?  No      Plan:     I have personally reviewed and noted the following in the patient's chart:   Medical and social history Use of alcohol, tobacco or illicit drugs  Current medications and supplements including opioid prescriptions.  Functional ability and status Nutritional status Physical activity Advanced directives List of other physicians Hospitalizations, surgeries, and ER visits in previous 12 months Vitals Screenings to include cognitive, depression, and falls Referrals and appointments  In addition, I have reviewed and discussed with patient certain preventive protocols, quality metrics, and best practice recommendations. A written personalized care plan for preventive services as well as general preventive health recommendations were provided to patient.  Criselda Peaches, LPN   11/12/3007   Nurse Notes: None

## 2021-09-02 NOTE — Patient Instructions (Signed)
April Stephenson , Thank you for taking time to come for your Medicare Wellness Visit. I appreciate your ongoing commitment to your health goals. Please review the following plan we discussed and let me know if I can assist you in the future.   These are the goals we discussed:  Goals   None     This is a list of the screening recommended for you and due dates:  Health Maintenance  Topic Date Due   COVID-19 Vaccine (1) Never done   HIV Screening  Never done   Hepatitis C Screening: USPSTF Recommendation to screen - Ages 5-79 yo.  Never done   Zoster (Shingles) Vaccine (1 of 2) Never done   Pap Smear  Never done   Mammogram  04/06/2016   Tetanus Vaccine  02/20/2017   Flu Shot  09/20/2021   Colon Cancer Screening  03/25/2027   HPV Vaccine  Aged Out    Advanced directives: Yes  Conditions/risks identified: None  Next appointment: Follow up in one year for your annual wellness visit     Preventive Care 65 Years and Older, Female Preventive care refers to lifestyle choices and visits with your health care provider that can promote health and wellness. What does preventive care include? A yearly physical exam. This is also called an annual well check. Dental exams once or twice a year. Routine eye exams. Ask your health care provider how often you should have your eyes checked. Personal lifestyle choices, including: Daily care of your teeth and gums. Regular physical activity. Eating a healthy diet. Avoiding tobacco and drug use. Limiting alcohol use. Practicing safe sex. Taking low-dose aspirin every day. Taking vitamin and mineral supplements as recommended by your health care provider. What happens during an annual well check? The services and screenings done by your health care provider during your annual well check will depend on your age, overall health, lifestyle risk factors, and family history of disease. Counseling  Your health care provider may ask you questions  about your: Alcohol use. Tobacco use. Drug use. Emotional well-being. Home and relationship well-being. Sexual activity. Eating habits. History of falls. Memory and ability to understand (cognition). Work and work Statistician. Reproductive health. Screening  You may have the following tests or measurements: Height, weight, and BMI. Blood pressure. Lipid and cholesterol levels. These may be checked every 5 years, or more frequently if you are over 72 years old. Skin check. Lung cancer screening. You may have this screening every year starting at age 33 if you have a 30-pack-year history of smoking and currently smoke or have quit within the past 15 years. Fecal occult blood test (FOBT) of the stool. You may have this test every year starting at age 83. Flexible sigmoidoscopy or colonoscopy. You may have a sigmoidoscopy every 5 years or a colonoscopy every 10 years starting at age 47. Hepatitis C blood test. Hepatitis B blood test. Sexually transmitted disease (STD) testing. Diabetes screening. This is done by checking your blood sugar (glucose) after you have not eaten for a while (fasting). You may have this done every 1-3 years. Bone density scan. This is done to screen for osteoporosis. You may have this done starting at age 33. Mammogram. This may be done every 1-2 years. Talk to your health care provider about how often you should have regular mammograms. Talk with your health care provider about your test results, treatment options, and if necessary, the need for more tests. Vaccines  Your health care provider may  recommend certain vaccines, such as: Influenza vaccine. This is recommended every year. Tetanus, diphtheria, and acellular pertussis (Tdap, Td) vaccine. You may need a Td booster every 10 years. Zoster vaccine. You may need this after age 51. Pneumococcal 13-valent conjugate (PCV13) vaccine. One dose is recommended after age 63. Pneumococcal polysaccharide (PPSV23)  vaccine. One dose is recommended after age 92. Talk to your health care provider about which screenings and vaccines you need and how often you need them. This information is not intended to replace advice given to you by your health care provider. Make sure you discuss any questions you have with your health care provider. Document Released: 03/05/2015 Document Revised: 10/27/2015 Document Reviewed: 12/08/2014 Elsevier Interactive Patient Education  2017 [place] Prevention in the Home Falls can cause injuries. They can happen to people of all ages. There are many things you can do to make your home safe and to help prevent falls. What can I do on the outside of my home? Regularly fix the edges of walkways and driveways and fix any cracks. Remove anything that might make you trip as you walk through a door, such as a raised step or threshold. Trim any bushes or trees on the path to your home. Use bright outdoor lighting. Clear any walking paths of anything that might make someone trip, such as rocks or tools. Regularly check to see if handrails are loose or broken. Make sure that both sides of any steps have handrails. Any raised decks and porches should have guardrails on the edges. Have any leaves, snow, or ice cleared regularly. Use sand or salt on walking paths during winter. Clean up any spills in your garage right away. This includes oil or grease spills. What can I do in the bathroom? Use night lights. Install grab bars by the toilet and in the tub and shower. Do not use towel bars as grab bars. Use non-skid mats or decals in the tub or shower. If you need to sit down in the shower, use a plastic, non-slip stool. Keep the floor dry. Clean up any water that spills on the floor as soon as it happens. Remove soap buildup in the tub or shower regularly. Attach bath mats securely with double-sided non-slip rug tape. Do not have throw rugs and other things on the floor that  can make you trip. What can I do in the bedroom? Use night lights. Make sure that you have a light by your bed that is easy to reach. Do not use any sheets or blankets that are too big for your bed. They should not hang down onto the floor. Have a firm chair that has side arms. You can use this for support while you get dressed. Do not have throw rugs and other things on the floor that can make you trip. What can I do in the kitchen? Clean up any spills right away. Avoid walking on wet floors. Keep items that you use a lot in easy-to-reach places. If you need to reach something above you, use a strong step stool that has a grab bar. Keep electrical cords out of the way. Do not use floor polish or wax that makes floors slippery. If you must use wax, use non-skid floor wax. Do not have throw rugs and other things on the floor that can make you trip. What can I do with my stairs? Do not leave any items on the stairs. Make sure that there are handrails on both sides of  the stairs and use them. Fix handrails that are broken or loose. Make sure that handrails are as long as the stairways. Check any carpeting to make sure that it is firmly attached to the stairs. Fix any carpet that is loose or worn. Avoid having throw rugs at the top or bottom of the stairs. If you do have throw rugs, attach them to the floor with carpet tape. Make sure that you have a light switch at the top of the stairs and the bottom of the stairs. If you do not have them, ask someone to add them for you. What else can I do to help prevent falls? Wear shoes that: Do not have high heels. Have rubber bottoms. Are comfortable and fit you well. Are closed at the toe. Do not wear sandals. If you use a stepladder: Make sure that it is fully opened. Do not climb a closed stepladder. Make sure that both sides of the stepladder are locked into place. Ask someone to hold it for you, if possible. Clearly mark and make sure that you  can see: Any grab bars or handrails. First and last steps. Where the edge of each step is. Use tools that help you move around (mobility aids) if they are needed. These include: Canes. Walkers. Scooters. Crutches. Turn on the lights when you go into a dark area. Replace any light bulbs as soon as they burn out. Set up your furniture so you have a clear path. Avoid moving your furniture around. If any of your floors are uneven, fix them. If there are any pets around you, be aware of where they are. Review your medicines with your doctor. Some medicines can make you feel dizzy. This can increase your chance of falling. Ask your doctor what other things that you can do to help prevent falls. This information is not intended to replace advice given to you by your health care provider. Make sure you discuss any questions you have with your health care provider. Document Released: 12/03/2008 Document Revised: 07/15/2015 Document Reviewed: 03/13/2014 Elsevier Interactive Patient Education  2017 [employer].

## 2021-11-22 IMAGING — DX DG FOOT COMPLETE 3+V*R*
3 series · 3 of 3 positions shown · non-contrast
Comparison: 04/23/2019

CLINICAL DATA: Follow-up fracture.

EXAM:
RIGHT FOOT COMPLETE - 3+ VIEW

[foot ap]
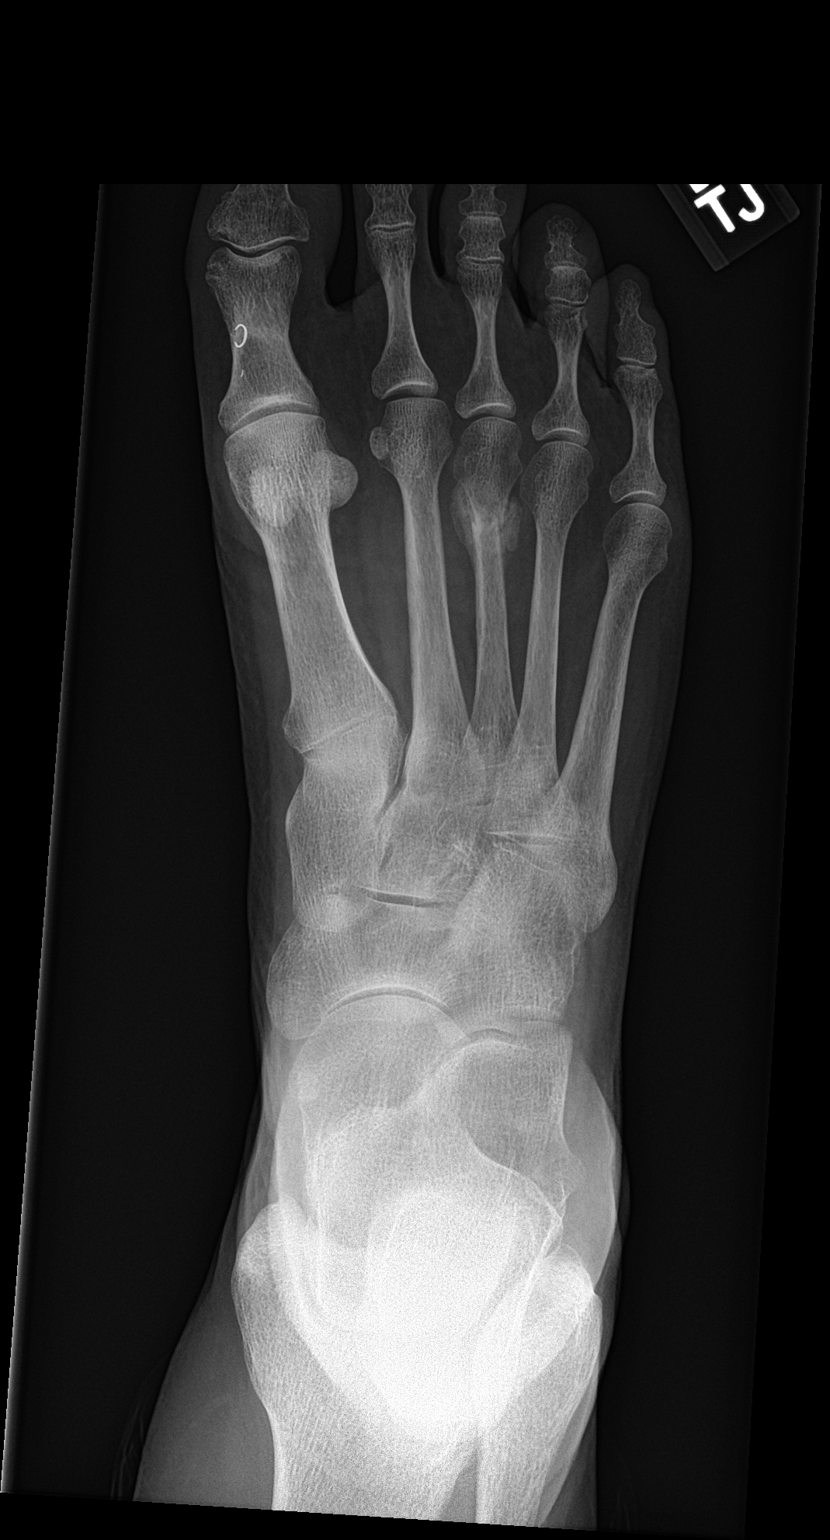

[foot obl]
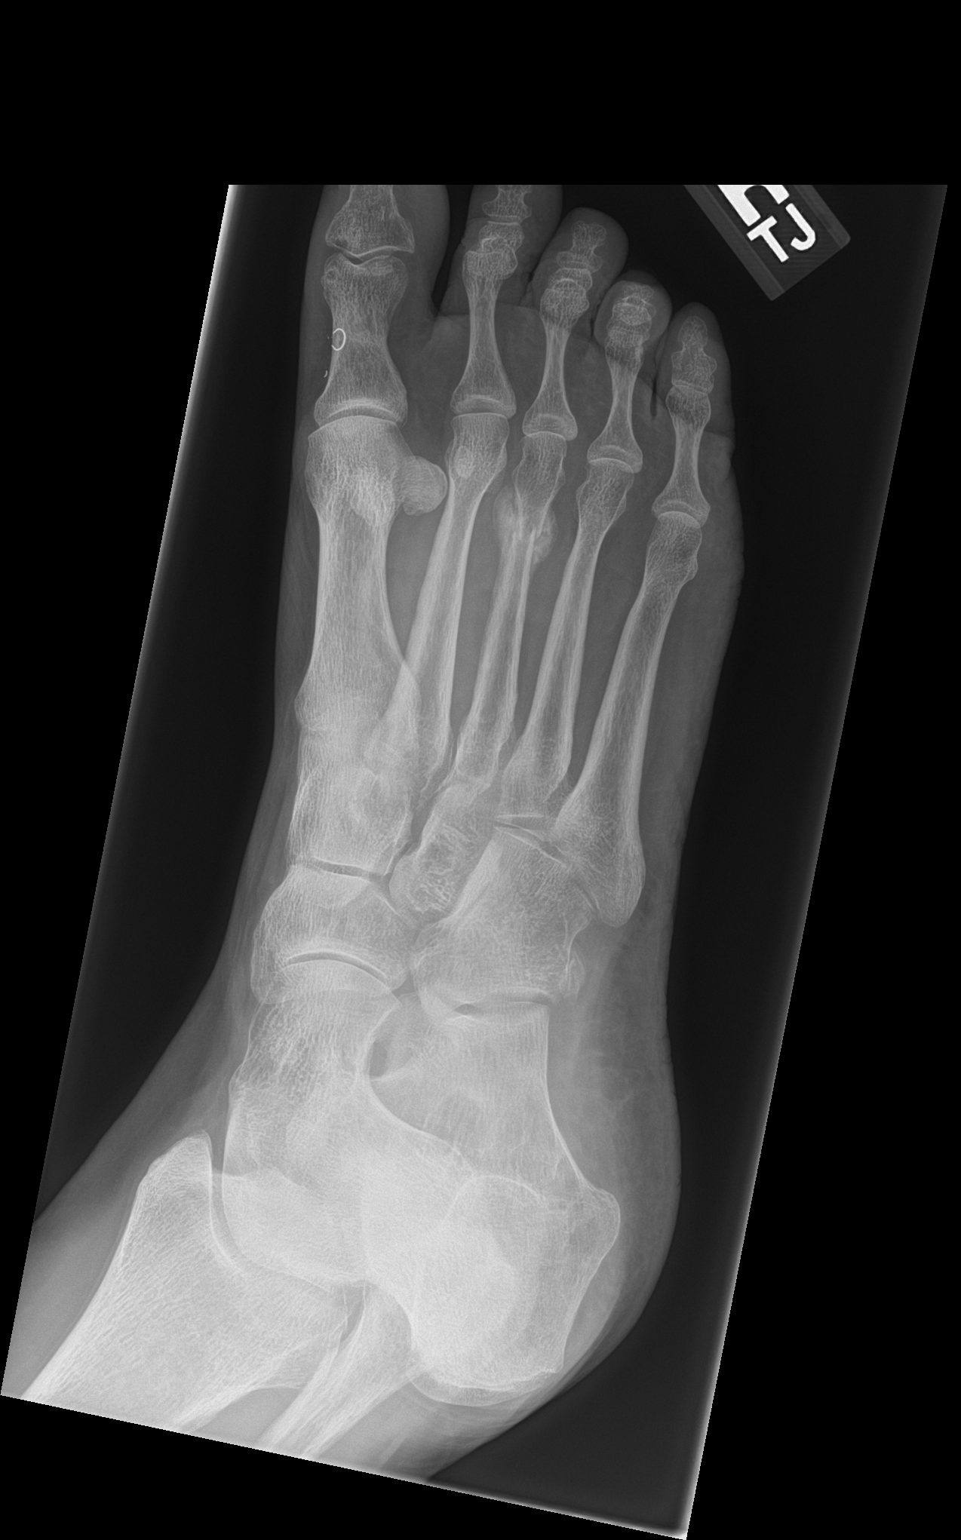

[foot lat]
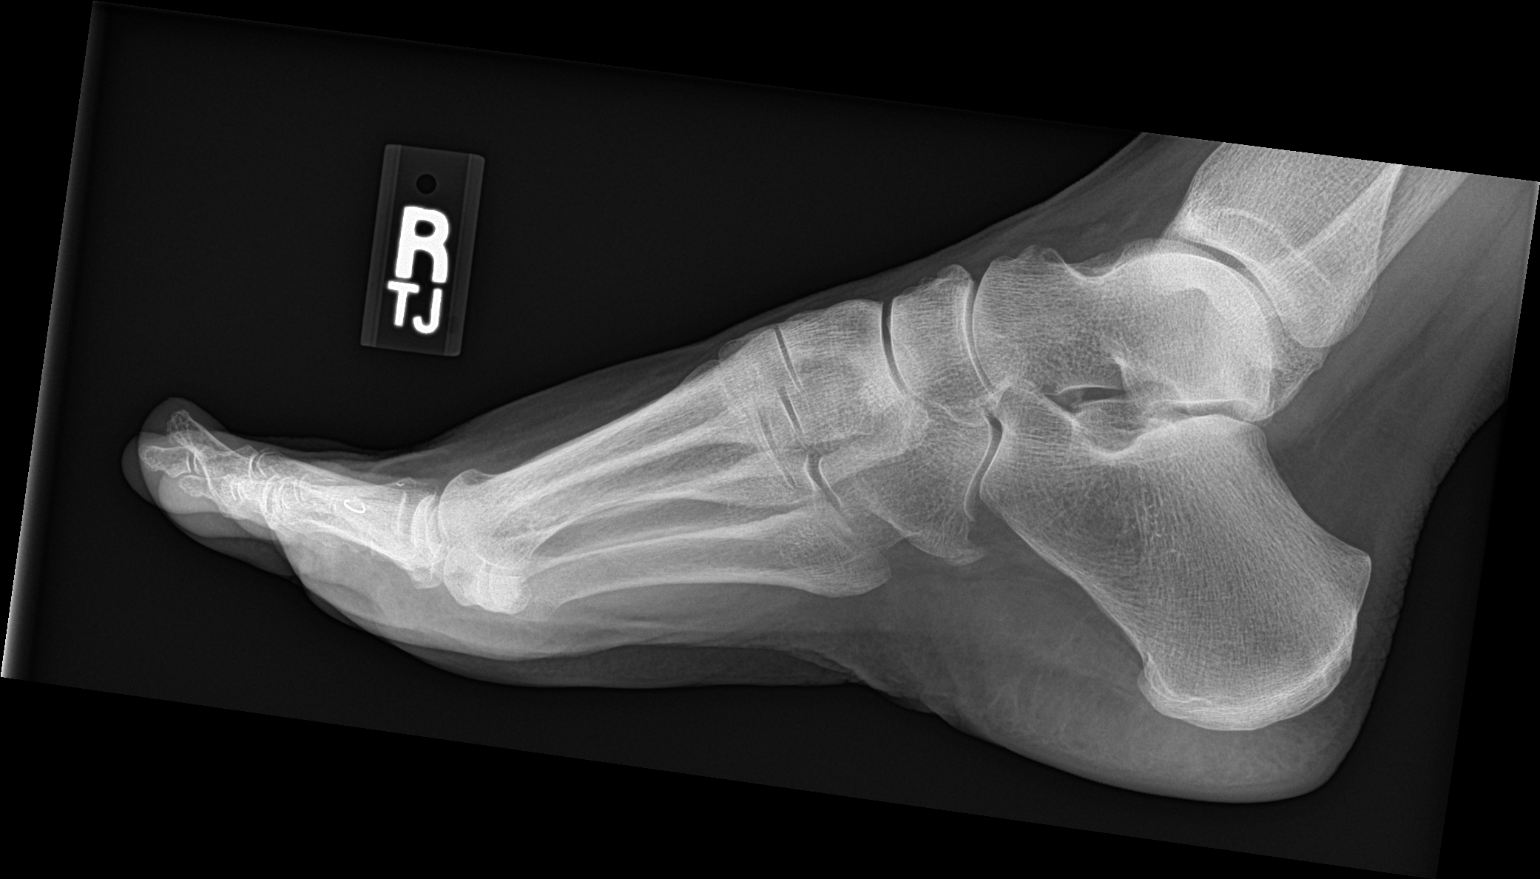

[3 of 3 positions shown; findings below may reference images not displayed]

FINDINGS: Exuberant callus now surrounds the fracture of the distal right
third metatarsal. Metatarsal fracture alignment is unchanged with
minor, approximately 1 mm, of lateral displacement.

No new fractures.  No bone lesions.

Joints are normally spaced and aligned.
IMPRESSION: 1. Interval partial healing of the fracture of the distal right
third metatarsal with significant surrounding callus formation. No
change in fracture alignment.

## 2022-05-12 ENCOUNTER — Encounter: Payer: Self-pay | Admitting: Family Medicine

## 2022-05-12 ENCOUNTER — Ambulatory Visit (INDEPENDENT_AMBULATORY_CARE_PROVIDER_SITE_OTHER): Payer: Medicare Other | Admitting: Family Medicine

## 2022-05-12 VITALS — BP 100/74 | HR 83 | Temp 98.0°F | Ht 66.5 in | Wt 183.5 lb

## 2022-05-12 DIAGNOSIS — H9202 Otalgia, left ear: Secondary | ICD-10-CM

## 2022-05-12 DIAGNOSIS — R051 Acute cough: Secondary | ICD-10-CM

## 2022-05-12 LAB — POC COVID19 BINAXNOW: SARS Coronavirus 2 Ag: NEGATIVE

## 2022-05-12 MED ORDER — AMOXICILLIN 500 MG PO CAPS: 1000.0000 mg | ORAL_CAPSULE | Freq: Two times a day (BID) | ORAL | 0 refills | Status: AC

## 2022-05-12 NOTE — Patient Instructions (Signed)
Rest, fluids. Complete course of antibiotics amoxicillin 500 mg 2 capsules twice daily x 10 days.  Next Can use Flonase 2 sprays per nostril daily to help eustachian tubes draining ear on left. If not improving significantly consider retesting for COVID on day 4-5 of illness.  If positive call for possible antiviral treatment.

## 2022-05-12 NOTE — Assessment & Plan Note (Signed)
Negative COVID testing in office today.  Most likely left otitis media  Rest, fluids. Complete course of antibiotics amoxicillin 500 mg 2 capsules twice daily x 10 days.  Next Can use Flonase 2 sprays per nostril daily to help eustachian tubes draining ear on left. If not improving significantly consider retesting for COVID on day 4-5 of illness.  If positive call for possible antiviral treatment.

## 2022-05-12 NOTE — Progress Notes (Signed)
Patient ID: April Stephenson, female    DOB: 08/06/1960, 62 y.o.   MRN: [id number]  This visit was conducted in person.  BP 100/74   Pulse 83   Temp 98 F (36.7 C) (Temporal)   Ht 5' 6.5" (1.689 m)   Wt 183 lb 8 oz (83.2 kg)   SpO2 96%   BMI 29.17 kg/m    CC:  Chief Complaint  Patient presents with   Ear Pain    Left    Subjective:   HPI: April Stephenson is a 62 y.o. female pt of Dr. Josefine Class presenting on 05/12/2022 for Ear Pain (Left)   Date of onset: yesterday Initial symptoms included  fullness and pain in let ear. Symptoms progressed to itching in left ear.  Now chest  congestion, mild cough. No ST, no face pain. No fever.  No SOB, no wheeze.  No flu like symptoms.. she is some    Sick contacts:  brother with cough COVID testing:    none History of COVID 2022.. has remaining tinnitus.     She has tried to treat with  OTC meds     No history of chronic lung disease such as asthma or COPD. Non-smoker.       Relevant past medical, surgical, family and social history reviewed and updated as indicated. Interim medical history since our last visit reviewed. Allergies and medications reviewed and updated. Outpatient Medications Prior to Visit  Medication Sig Dispense Refill   buprenorphine (SUBUTEX) 8 MG SUBL SL tablet Place under the tongue.     cyclobenzaprine (FLEXERIL) 10 MG tablet Take 10 mg by mouth 3 (three) times daily as needed for muscle spasms.      famotidine (PEPCID) 20 MG tablet Take 20 mg by mouth 2 (two) times daily as needed for heartburn or indigestion.     fluticasone (FLONASE) 50 MCG/ACT nasal spray Place 2 sprays into both nostrils daily. 16 g 1   mirtazapine (REMERON) 30 MG tablet TAKE ONE TABLET BY MOUTH AT BEDTIME FOR INSOMNIA     QUEtiapine (SEROQUEL) 25 MG tablet Take 1-2 tabs at night as needed for sleep.     sertraline (ZOLOFT) 50 MG tablet 50mg  taken by mouth daily.     No facility-administered medications prior to visit.      Per HPI unless specifically indicated in ROS section below Review of Systems  Constitutional:  Negative for fatigue and fever.  HENT:  Negative for congestion.   Eyes:  Negative for pain.  Respiratory:  Negative for cough and shortness of breath.   Cardiovascular:  Negative for chest pain, palpitations and leg swelling.  Gastrointestinal:  Negative for abdominal pain.  Genitourinary:  Negative for dysuria and vaginal bleeding.  Musculoskeletal:  Negative for back pain.  Neurological:  Negative for syncope, light-headedness and headaches.  Psychiatric/Behavioral:  Negative for dysphoric mood.    Objective:  BP 100/74   Pulse 83   Temp 98 F (36.7 C) (Temporal)   Ht 5' 6.5" (1.689 m)   Wt 183 lb 8 oz (83.2 kg)   SpO2 96%   BMI 29.17 kg/m   Wt Readings from Last 3 Encounters:  05/12/22 183 lb 8 oz (83.2 kg)  09/02/21 178 lb (80.7 kg)  05/06/21 169 lb 12.8 oz (77 kg)      Physical Exam Constitutional:      General: She is not in acute distress.    Appearance: Normal appearance. She is well-developed. She is not ill-appearing  or toxic-appearing.  HENT:     Head: Normocephalic.     Right Ear: Hearing, tympanic membrane, ear canal and external ear normal. No middle ear effusion. Tympanic membrane is not erythematous, retracted or bulging.     Left Ear: Hearing, ear canal and external ear normal. Tenderness present. A middle ear effusion is present. Tympanic membrane is erythematous and bulging. Tympanic membrane is not retracted.     Nose: Rhinorrhea present. No mucosal edema.     Right Sinus: No maxillary sinus tenderness or frontal sinus tenderness.     Left Sinus: No maxillary sinus tenderness or frontal sinus tenderness.     Mouth/Throat:     Pharynx: Uvula midline.  Eyes:     General: Lids are normal. Lids are everted, no foreign bodies appreciated.     Conjunctiva/sclera: Conjunctivae normal.     Pupils: Pupils are equal, round, and reactive to light.  Neck:      Thyroid: No thyroid mass or thyromegaly.     Vascular: No carotid bruit.     Trachea: Trachea normal.  Cardiovascular:     Rate and Rhythm: Normal rate and regular rhythm.     Pulses: Normal pulses.     Heart sounds: Normal heart sounds, S1 normal and S2 normal. No murmur heard.    No friction rub. No gallop.  Pulmonary:     Effort: Pulmonary effort is normal. No tachypnea or respiratory distress.     Breath sounds: Normal breath sounds. No decreased breath sounds, wheezing, rhonchi or rales.  Abdominal:     General: Bowel sounds are normal.     Palpations: Abdomen is soft.     Tenderness: There is no abdominal tenderness.  Musculoskeletal:     Cervical back: Normal range of motion and neck supple.  Skin:    General: Skin is warm and dry.     Findings: No rash.  Neurological:     Mental Status: She is alert.  Psychiatric:        Mood and Affect: Mood is not anxious or depressed.        Speech: Speech normal.        Behavior: Behavior normal. Behavior is cooperative.        Thought Content: Thought content normal.        Judgment: Judgment normal.       Results for orders placed or performed during the hospital encounter of 07/06/19  SARS Coronavirus 2 by RT PCR (hospital order, performed in [place] hospital lab) Nasopharyngeal Nasopharyngeal Swab   Specimen: Nasopharyngeal Swab  Result Value Ref Range   SARS Coronavirus 2 NEGATIVE NEGATIVE  CBC with Differential  Result Value Ref Range   WBC 8.9 4.0 - 10.5 K/uL   RBC 3.78 (L) 3.87 - 5.11 MIL/uL   Hemoglobin 12.0 12.0 - 15.0 g/dL   HCT 36.7 36.0 - 46.0 %   MCV 97.1 80.0 - 100.0 fL   MCH 31.7 26.0 - 34.0 pg   MCHC 32.7 30.0 - 36.0 g/dL   RDW 12.7 11.5 - 15.5 %   Platelets 236 150 - 400 K/uL   nRBC 0.0 0.0 - 0.2 %   Neutrophils Relative % 52 %   Neutro Abs 4.6 1.7 - 7.7 K/uL   Lymphocytes Relative 33 %   Lymphs Abs 3.0 0.7 - 4.0 K/uL   Monocytes Relative 10 %   Monocytes Absolute 0.9 0.1 - 1.0 K/uL   Eosinophils  Relative 4 %   Eosinophils Absolute 0.4  0.0 - 0.5 K/uL   Basophils Relative 1 %   Basophils Absolute 0.1 0.0 - 0.1 K/uL   Immature Granulocytes 0 %   Abs Immature Granulocytes 0.02 0.00 - 0.07 K/uL  Comprehensive metabolic panel  Result Value Ref Range   Sodium 140 135 - 145 mmol/L   Potassium 3.7 3.5 - 5.1 mmol/L   Chloride 104 98 - 111 mmol/L   CO2 27 22 - 32 mmol/L   Glucose, Bld 124 (H) 70 - 99 mg/dL   BUN 11 6 - 20 mg/dL   Creatinine, Ser 0.97 0.44 - 1.00 mg/dL   Calcium 8.1 (L) 8.9 - 10.3 mg/dL   Total Protein 6.1 (L) 6.5 - 8.1 g/dL   Albumin 2.9 (L) 3.5 - 5.0 g/dL   AST 24 15 - 41 U/L   ALT 23 0 - 44 U/L   Alkaline Phosphatase 106 38 - 126 U/L   Total Bilirubin 0.9 0.3 - 1.2 mg/dL   GFR calc non Af Amer >60 >60 mL/min   GFR calc Af Amer >60 >60 mL/min   Anion gap 9 5 - 15  Urinalysis, Routine w reflex microscopic  Result Value Ref Range   Color, Urine YELLOW YELLOW   APPearance CLEAR CLEAR   Specific Gravity, Urine 1.017 1.005 - 1.030   pH 5.0 5.0 - 8.0   Glucose, UA NEGATIVE NEGATIVE mg/dL   Hgb urine dipstick NEGATIVE NEGATIVE   Bilirubin Urine NEGATIVE NEGATIVE   Ketones, ur NEGATIVE NEGATIVE mg/dL   Protein, ur NEGATIVE NEGATIVE mg/dL   Nitrite NEGATIVE NEGATIVE   Leukocytes,Ua NEGATIVE NEGATIVE  Rapid urine drug screen (hospital performed)  Result Value Ref Range   Opiates NONE DETECTED NONE DETECTED   Cocaine NONE DETECTED NONE DETECTED   Benzodiazepines NONE DETECTED NONE DETECTED   Amphetamines NONE DETECTED NONE DETECTED   Tetrahydrocannabinol POSITIVE (A) NONE DETECTED   Barbiturates NONE DETECTED NONE DETECTED  Ethanol  Result Value Ref Range   Alcohol, Ethyl (B) <10 <10 mg/dL  Ammonia  Result Value Ref Range   Ammonia 19 9 - 35 umol/L  Salicylate level  Result Value Ref Range   Salicylate Lvl Q000111Q (L) 7.0 - 30.0 mg/dL  I-Stat beta hCG blood, ED  Result Value Ref Range   I-stat hCG, quantitative <5.0 <5 mIU/mL   Comment 3             Assessment and Plan  Acute cough -     POC COVID-19 BinaxNow  Acute ear pain, left  Other orders -     Amoxicillin; Take 2 capsules (1,000 mg total) by mouth 2 (two) times daily.  Dispense: 40 capsule; Refill: 0    No follow-ups on file.   Eliezer Lofts, MD

## 2022-07-22 HISTORY — PX: MOHS SURGERY: SHX181

## 2022-08-31 ENCOUNTER — Ambulatory Visit: Payer: Medicare Other | Admitting: Dietician

## 2022-09-06 ENCOUNTER — Ambulatory Visit (INDEPENDENT_AMBULATORY_CARE_PROVIDER_SITE_OTHER): Payer: Medicare Other

## 2022-09-06 VITALS — Ht 67.0 in | Wt 190.0 lb

## 2022-09-06 DIAGNOSIS — Z0000 Encounter for general adult medical examination without abnormal findings: Secondary | ICD-10-CM | POA: Diagnosis not present

## 2022-09-06 NOTE — Patient Instructions (Signed)
[name] , Thank you for taking time to come for your Medicare Wellness Visit. I appreciate your ongoing commitment to your health goals. Please review the following plan we discussed and let me know if I can assist you in the future.   These are the goals we discussed:  Goals       Lose 20lb (pt-stated)      Patient Stated      Lose 40 pounds.        This is a list of the screening recommended for you and due dates:  Health Maintenance  Topic Date Due   COVID-19 Vaccine (1) Never done   HIV Screening  Never done   Hepatitis C Screening  Never done   Pap Smear  Never done   Mammogram  04/06/2016   DTaP/Tdap/Td vaccine (2 - Td or Tdap) 02/20/2017   Medicare Annual Wellness Visit  09/03/2022   Flu Shot  09/21/2022   Colon Cancer Screening  03/25/2027   Zoster (Shingles) Vaccine  Completed   HPV Vaccine  Aged Out    Advanced directives: Advance directive discussed with you today. Even though you declined this today, please call our office should you change your mind, and we can give you the proper paperwork for you to fill out.   Conditions/risks identified: Aim for 30 minutes of exercise or brisk walking, 6-8 glasses of water, and 5 servings of fruits and vegetables each day.   Next appointment: Follow up in one year for your annual wellness visit. 09/11/23 @ 8:15 televisit  Preventive Care 40-64 Years, Female Preventive care refers to lifestyle choices and visits with your health care provider that can promote health and wellness. What does preventive care include? A yearly physical exam. This is also called an annual well check. Dental exams once or twice a year. Routine eye exams. Ask your health care provider how often you should have your eyes checked. Personal lifestyle choices, including: Daily care of your teeth and gums. Regular physical activity. Eating a healthy diet. Avoiding tobacco and drug use. Limiting alcohol use. Practicing safe sex. Taking low-dose  aspirin daily starting at age 4. Taking vitamin and mineral supplements as recommended by your health care provider. What happens during an annual well check? The services and screenings done by your health care provider during your annual well check will depend on your age, overall health, lifestyle risk factors, and family history of disease. Counseling  Your health care provider may ask you questions about your: Alcohol use. Tobacco use. Drug use. Emotional well-being. Home and relationship well-being. Sexual activity. Eating habits. Work and work Astron[employer]. Method of birth control. Menstrual cycle. Pregnancy history. Screening  You may have the following tests or measurements: Height, weight, and BMI. Blood pressure. Lipid and cholesterol levels. These may be checked every 5 years, or more frequently if you are over 7 years old. Skin check. Lung cancer screening. You may have this screening every year starting at age 44 if you have a 30-pack-year history of smoking and currently smoke or have quit within the past 15 years. Fecal occult blood test (FOBT) of the stool. You may have this test every year starting at age 45. Flexible sigmoidoscopy or colonoscopy. You may have a sigmoidoscopy every 5 years or a colonoscopy every 10 years starting at age 26. Hepatitis C blood test. Hepatitis B blood test. Sexually transmitted disease (STD) testing. Diabetes screening. This is done by checking your blood sugar (glucose) after you have not eaten  for a while (fasting). You may have this done every 1-3 years. Mammogram. This may be done every 1-2 years. Talk to your health care provider about when you should start having regular mammograms. This may depend on whether you have a family history of breast cancer. BRCA-related cancer screening. This may be done if you have a family history of breast, ovarian, tubal, or peritoneal cancers. Pelvic exam and Pap test. This may be done every 3  years starting at age 44. Starting at age 1, this may be done every 5 years if you have a Pap test in combination with an HPV test. Bone density scan. This is done to screen for osteoporosis. You may have this scan if you are at high risk for osteoporosis. Discuss your test results, treatment options, and if necessary, the need for more tests with your health care provider. Vaccines  Your health care provider may recommend certain vaccines, such as: Influenza vaccine. This is recommended every year. Tetanus, diphtheria, and acellular pertussis (Tdap, Td) vaccine. You may need a Td booster every 10 years. Zoster vaccine. You may need this after age 77. Pneumococcal 13-valent conjugate (PCV13) vaccine. You may need this if you have certain conditions and were not previously vaccinated. Pneumococcal polysaccharide (PPSV23) vaccine. You may need one or two doses if you smoke cigarettes or if you have certain conditions. Talk to your health care provider about which screenings and vaccines you need and how often you need them. This information is not intended to replace advice given to you by your health care provider. Make sure you discuss any questions you have with your health care provider. Document Released: 03/05/2015 Document Revised: 10/27/2015 Document Reviewed: 12/08/2014 Elsevier Interactive Patient Education  2017 [employer].    Fall Prevention in the Home Falls can cause injuries. They can happen to people of all ages. There are many things you can do to make your home safe and to help prevent falls. What can I do on the outside of my home? Regularly fix the edges of walkways and driveways and fix any cracks. Remove anything that might make you trip as you walk through a door, such as a raised step or threshold. Trim any bushes or trees on the path to your home. Use bright outdoor lighting. Clear any walking paths of anything that might make someone trip, such as rocks or  tools. Regularly check to see if handrails are loose or broken. Make sure that both sides of any steps have handrails. Any raised decks and porches should have guardrails on the edges. Have any leaves, snow, or ice cleared regularly. Use sand or salt on walking paths during winter. Clean up any spills in your garage right away. This includes oil or grease spills. What can I do in the bathroom? Use night lights. Install grab bars by the toilet and in the tub and shower. Do not use towel bars as grab bars. Use non-skid mats or decals in the tub or shower. If you need to sit down in the shower, use a plastic, non-slip stool. Keep the floor dry. Clean up any water that spills on the floor as soon as it happens. Remove soap buildup in the tub or shower regularly. Attach bath mats securely with double-sided non-slip rug tape. Do not have throw rugs and other things on the floor that can make you trip. What can I do in the bedroom? Use night lights. Make sure that you have a light by your bed that  is easy to reach. Do not use any sheets or blankets that are too big for your bed. They should not hang down onto the floor. Have a firm chair that has side arms. You can use this for support while you get dressed. Do not have throw rugs and other things on the floor that can make you trip. What can I do in the kitchen? Clean up any spills right away. Avoid walking on wet floors. Keep items that you use a lot in easy-to-reach places. If you need to reach something above you, use a strong step stool that has a grab bar. Keep electrical cords out of the way. Do not use floor polish or wax that makes floors slippery. If you must use wax, use non-skid floor wax. Do not have throw rugs and other things on the floor that can make you trip. What can I do with my stairs? Do not leave any items on the stairs. Make sure that there are handrails on both sides of the stairs and use them. Fix handrails that are  broken or loose. Make sure that handrails are as long as the stairways. Check any carpeting to make sure that it is firmly attached to the stairs. Fix any carpet that is loose or worn. Avoid having throw rugs at the top or bottom of the stairs. If you do have throw rugs, attach them to the floor with carpet tape. Make sure that you have a light switch at the top of the stairs and the bottom of the stairs. If you do not have them, ask someone to add them for you. What else can I do to help prevent falls? Wear shoes that: Do not have high heels. Have rubber bottoms. Are comfortable and fit you well. Are closed at the toe. Do not wear sandals. If you use a stepladder: Make sure that it is fully opened. Do not climb a closed stepladder. Make sure that both sides of the stepladder are locked into place. Ask someone to hold it for you, if possible. Clearly mark and make sure that you can see: Any grab bars or handrails. First and last steps. Where the edge of each step is. Use tools that help you move around (mobility aids) if they are needed. These include: Canes. Walkers. Scooters. Crutches. Turn on the lights when you go into a dark area. Replace any light bulbs as soon as they burn out. Set up your furniture so you have a clear path. Avoid moving your furniture around. If any of your floors are uneven, fix them. If there are any pets around you, be aware of where they are. Review your medicines with your doctor. Some medicines can make you feel dizzy. This can increase your chance of falling. Ask your doctor what other things that you can do to help prevent falls. This information is not intended to replace advice given to you by your health care provider. Make sure you discuss any questions you have with your health care provider. Document Released: 12/03/2008 Document Revised: 07/15/2015 Document Reviewed: 03/13/2014 Elsevier Interactive Patient Education  2017 [employer].

## 2022-09-06 NOTE — Progress Notes (Signed)
Subjective:   April Stephenson is a 63 y.o. female who presents for Medicare Annual (Subsequent) preventive examination.  Visit Complete: Virtual  I connected with  [name] on 09/06/22 by a audio enabled telemedicine application and verified that I am speaking with the correct person using two identifiers.  Patient Location: Home  Provider Location: Office/Clinic  I discussed the limitations of evaluation and management by telemedicine. The patient expressed understanding and agreed to proceed.  Per patient no change in vitals since last visit, unable to obtain new vitals due to telehealth visit   Review of Systems     Cardiac Risk Factors include: advanced age (>29men, >57 women);hypertension;sedentary lifestyle     Objective:    Today's Vitals   09/06/22 0844  Weight: 190 lb (86.2 kg)  Height: 5\' 7"  (1.702 m)   Body mass index is 29.76 kg/m.     09/06/2022    8:51 AM 09/02/2021    3:51 PM 07/06/2019   10:31 AM 07/06/2019    1:35 AM 07/05/2019    8:47 PM 12/31/2017    2:56 PM 07/11/2017   11:14 AM  Advanced Directives  Does Patient Have a Medical Advance Directive? No Yes No No No Yes No  Type of Advance Directive  Living will       Does patient want to make changes to medical advance directive?  No - Patient declined    No - Patient declined   Would patient like information on creating a medical advance directive? No - Patient declined  No - Patient declined  No - Patient declined  No - Patient declined    Current Medications (verified) Outpatient Encounter Medications as of 09/06/2022  Medication Sig   buprenorphine (SUBUTEX) 8 MG SUBL SL tablet Place under the tongue.   cyclobenzaprine (FLEXERIL) 10 MG tablet Take 10 mg by mouth 3 (three) times daily as needed for muscle spasms.    famotidine (PEPCID) 20 MG tablet Take 20 mg by mouth 2 (two) times daily as needed for heartburn or indigestion.   fluticasone (FLONASE) 50 MCG/ACT nasal spray Place 2 sprays  into both nostrils daily.   mirtazapine (REMERON) 30 MG tablet TAKE ONE TABLET BY MOUTH AT BEDTIME FOR INSOMNIA   QUEtiapine (SEROQUEL) 25 MG tablet Take 1-2 tabs at night as needed for sleep.   sertraline (ZOLOFT) 50 MG tablet 50mg  taken by mouth daily.   amoxicillin (AMOXIL) 500 MG capsule Take 2 capsules (1,000 mg total) by mouth 2 (two) times daily. (Patient not taking: Reported on 09/06/2022)   No facility-administered encounter medications on file as of 09/06/2022.    Allergies (verified) Diclofenac sodium, Tramadol, Venlafaxine, Ambien [zolpidem], Gabapentin, and Metoprolol tartrate   History: Past Medical History:  Diagnosis Date   Arthritis    with longstanding foot and back pain after injuries, followed by Dr. Lajoyce Corners   Atypical chest pain    a. 06/2016 MV: EF 76%, HTN response, no ischemia/infarct (performed @ [facility]).   Cancer [facility])    cervical cancer   Depression    per Dr. Mila Homer with psych, h/o suicide attempt at 62 y/o   Dupuytren's contracture of left hand 2016   GERD (gastroesophageal reflux disease)    Impingement syndrome of left shoulder 06/16/2011   Migraine with aura    Orthostatic hypotension    Portal vein thrombosis    a. unclear etiology->on eliquis.   PSVT (paroxysmal supraventricular tachycardia)    a. 05/2017 2wk Event Monitor: Intermittent SVT,  fastest 190 bpm, longest 14 seconds. No significant bradycardia. Avg HR 81.   Suicidal overdose (HCC)    Syncope    a. 04/2017 Echo: EF 55-60%, Gr1 DD, no rwma, nl RV fxn.   Past Surgical History:  Procedure Laterality Date   ABDOMINAL HYSTERECTOMY  2000   TAH/BSO   APPENDECTOMY     COLONOSCOPY     FOOT SURGERY     R foot-great toe   MOHS SURGERY  07/2022   forehead   ORIF RADIAL FRACTURE Bilateral 03/13/2014   Procedure: OPEN REDUCTION INTERNAL FIXATION (ORIF) RADIAL FRACTURE;  Surgeon: Knute Neu, MD;  Location: [facility] OR;  Service: Plastics;  Laterality: Bilateral;   SHOULDER SURGERY     2013    TONSILLECTOMY     Family History  Problem Relation Age of Onset   Depression Mother    Colon cancer Mother    Kidney cancer Mother    Heart disease Father    Stroke Father    Depression Brother    Social History   Socioeconomic History   Marital status: Divorced    Spouse name: Not on file   Number of children: Not on file   Years of education: Not on file   Highest education level: Not on file  Occupational History   Not on file  Tobacco Use   Smoking status: Never   Smokeless tobacco: Never  Vaping Use   Vaping status: Never Used  Substance and Sexual Activity   Alcohol use: Yes    Alcohol/week: 0.0 standard drinks of alcohol    Comment: occassionally   Drug use: Yes    Frequency: 1.0 times per week    Types: Marijuana    Comment: Edibles    Sexual activity: Yes  Other Topics Concern   Not on file  Social History Narrative   3 kids, 1 with down's, 1 with asperger's   Separated as of 2019   Works at [employer], former Print production planner    Social Determinants of Health   Financial Resource Strain: Low Risk  (09/06/2022)   Overall Financial Resource Strain (CARDIA)    Difficulty of Paying Living Expenses: Not hard at all  Food Insecurity: No Food Insecurity (09/06/2022)   Hunger Vital Sign    Worried About Running Out of Food in the Last Year: Never true    Ran Out of Food in the Last Year: Never true  Transportation Needs: No Transportation Needs (09/06/2022)   PRAPARE - Administrator, Civil Service (Medical): No    Lack of Transportation (Non-Medical): No  Physical Activity: Inactive (09/06/2022)   Exercise Vital Sign    Days of Exercise per Week: 0 days    Minutes of Exercise per Session: 0 min  Stress: No Stress Concern Present (09/06/2022)   [employer] of Occupational Health - Occupational Stress Questionnaire    Feeling of Stress : Not at all  Social Connections: Moderately Integrated (09/06/2022)   Social Connection and Isolation Panel  [NHANES]    Frequency of Communication with Friends and Family: More than three times a week    Frequency of Social Gatherings with Friends and Family: More than three times a week    Attends Religious Services: More than 4 times per year    Active Member of [employer] or Organizations: Yes    Attends Engineer, structural: More than 4 times per year    Marital Status: Divorced    Tobacco Counseling Counseling given: Not Answered  Clinical Intake:  Pre-visit preparation completed: Yes  Pain : No/denies pain     BMI - recorded: 29.76 Nutritional Status: BMI 25 -29 Overweight Nutritional Risks: Unintentional weight gain (has gained 20lb in 1 mo) Diabetes: No  How often do you need to have someone help you when you read instructions, pamphlets, or other written materials from your doctor or pharmacy?: 1 - Never  Interpreter Needed?: No  Information entered by :: C.Raahi Korber LPN   Activities of Daily Living    09/06/2022    8:53 AM  In your present state of health, do you have any difficulty performing the following activities:  Hearing? 1  Comment Tinnitus right ear  Vision? 0  Difficulty concentrating or making decisions? 0  Walking or climbing stairs? 0  Dressing or bathing? 0  Doing errands, shopping? 0  Preparing Food and eating ? N  Using the Toilet? N  In the past six months, have you accidently leaked urine? N  Do you have problems with loss of bowel control? N  Managing your Medications? N  Managing your Finances? N  Housekeeping or managing your Housekeeping? N    Patient Care Team: Joaquim Nam, MD as PCP - General (Family Medicine) Iran Ouch, MD as PCP - Cardiology (Cardiology) Knute Neu, MD as Consulting Physician (General Surgery) Care, [facility] (Pain Medicine)  Indicate any recent Medical Services you may have received from other than [facility] providers in the past year (date may be approximate).      Assessment:   This is a routine wellness examination for Montara.  Hearing/Vision screen Hearing Screening - Comments:: Has tinnitus in right ear Vision Screening - Comments:: [facility]  Dietary issues and exercise activities discussed:     Goals Addressed             This Visit's Progress    Patient Stated       Lose 40 pounds.       Depression Screen    05/12/2022   10:50 AM 09/02/2021    3:47 PM  PHQ 2/9 Scores  PHQ - 2 Score 0 0  PHQ- 9 Score 0     Fall Risk    09/06/2022    8:52 AM 05/12/2022   10:50 AM 09/02/2021    3:50 PM  Fall Risk   Falls in the past year? 0 0 0  Number falls in past yr: 0 0 0  Injury with Fall? 0 0 0  Risk for fall due to : No Fall Risks No Fall Risks No Fall Risks  Follow up Falls prevention discussed;Falls evaluation completed Falls evaluation completed     MEDICARE RISK AT HOME:  Medicare Risk at Home - 09/06/22 0853     Any stairs in or around the home? No    If so, are there any without handrails? No    Home free of loose throw rugs in walkways, pet beds, electrical cords, etc? Yes    Adequate lighting in your home to reduce risk of falls? Yes    Life alert? No    Use of a cane, walker or w/c? No    Grab bars in the bathroom? No    Shower chair or bench in shower? No    Elevated toilet seat or a handicapped toilet? No             TIMED UP AND GO:  Was the test performed?  No    Cognitive Function:        09/06/2022    8:54 AM 09/02/2021    3:51 PM  6CIT Screen  What Year? 0 points 0 points  What month? 0 points 0 points  What time? 0 points 0 points  Count back from 20 0 points 0 points  Months in reverse 0 points 0 points  Repeat phrase 0 points 0 points  Total Score 0 points 0 points    Immunizations Immunization History  Administered Date(s) Administered   Hep A / Hep B 02/11/2002, 09/29/2002   Tdap 02/21/2007   Zoster Recombinant(Shingrix) 02/15/2022    TDAP status: Due,  Education has been provided regarding the importance of this vaccine. Advised may receive this vaccine at local pharmacy or Health Dept. Aware to provide a copy of the vaccination record if obtained from local pharmacy or Health Dept. Verbalized acceptance and understanding.  Flu Vaccine status: Declined, Education has been provided regarding the importance of this vaccine but patient still declined. Advised may receive this vaccine at local pharmacy or Health Dept. Aware to provide a copy of the vaccination record if obtained from local pharmacy or Health Dept. Verbalized acceptance and understanding.  Pneumococcal vaccine status: Declined,  Education has been provided regarding the importance of this vaccine but patient still declined. Advised may receive this vaccine at local pharmacy or Health Dept. Aware to provide a copy of the vaccination record if obtained from local pharmacy or Health Dept. Verbalized acceptance and understanding.   Covid-19 vaccine status: Declined, Education has been provided regarding the importance of this vaccine but patient still declined. Advised may receive this vaccine at local pharmacy or Health Dept.or vaccine clinic. Aware to provide a copy of the vaccination record if obtained from local pharmacy or Health Dept. Verbalized acceptance and understanding.  Qualifies for Shingles Vaccine? Yes   Zostavax completed No   Shingrix Completed?: Yes per pt vaccinated at [place] in [place].  Screening Tests Health Maintenance  Topic Date Due   COVID-19 Vaccine (1) Never done   HIV Screening  Never done   Hepatitis C Screening  Never done   PAP SMEAR-Modifier  Never done   MAMMOGRAM  04/06/2016   DTaP/Tdap/Td (2 - Td or Tdap) 02/20/2017   Medicare Annual Wellness (AWV)  09/03/2022   INFLUENZA VACCINE  09/21/2022   Colonoscopy  03/25/2027   Zoster Vaccines- Shingrix  Completed   HPV VACCINES  Aged Out    Health Maintenance  Health Maintenance Due  Topic Date  Due   COVID-19 Vaccine (1) Never done   HIV Screening  Never done   Hepatitis C Screening  Never done   PAP SMEAR-Modifier  Never done   MAMMOGRAM  04/06/2016   DTaP/Tdap/Td (2 - Td or Tdap) 02/20/2017   Medicare Annual Wellness (AWV)  09/03/2022    Colorectal Cancer Screening - Pt declined  Mammogram status: No longer required due to Pt declined.   Lung Cancer Screening: (Low Dose CT Chest recommended if Age 31-80 years, 20 pack-year currently smoking OR have quit w/in 15years.) does not qualify.   Lung Cancer Screening Referral: n/a  Additional Screening:  Hepatitis C Screening: does qualify; Completed DUE  Vision Screening: Recommended annual ophthalmology exams for early detection of glaucoma and other disorders of the eye. Is the patient up to date with their annual eye exam?  Yes  Who is the provider or what is the name of the office in which the patient attends [facility]  [facility] exams[facility] If pt is not established with a provider, would they like to be referred to a provider to establish care? No .   Dental Screening: Recommended annual dental exams for proper oral hygiene    Community Resource Referral / Chronic Care Management: CRR required this visit?  No   CCM required this visit?  No     Plan:     I have personally reviewed and noted the following in the patient's chart:   Medical and social history Use of alcohol, tobacco or illicit drugs  Current medications and supplements including opioid prescriptions. Patient is not currently taking opioid prescriptions. Functional ability and status Nutritional status Physical activity Advanced directives List of other physicians Hospitalizations, surgeries, and ER visits in previous 12 months Vitals Screenings to include cognitive, depression, and falls Referrals and appointments  In addition, I have reviewed and discussed with patient certain preventive protocols, quality metrics, and best practice  recommendations. A written personalized care plan for preventive services as well as general preventive health recommendations were provided to patient.     Maryan Puls, LPN   [id number]   After Visit Summary: (MyChart) Due to this being a telephonic visit, the after visit summary with patients personalized plan was offered to patient via MyChart   Nurse Notes: Vaccinations: declines  Influenza vaccine: recommend every Fall Pneumococcal vaccine: recommend once per lifetime Prevnar-20 Covid-19: recommend 2 doses one month apart with a booster 6 months later

## 2022-09-12 NOTE — Progress Notes (Signed)
Subjective:   April Stephenson is a 62 y.o. female who presents for Medicare Annual (Subsequent) preventive examination.  Visit Complete: Virtual  I connected with  April Stephenson on 09/12/22 by a audio enabled telemedicine application and verified that I am speaking with the correct person using two identifiers.  Patient Location: Home  Provider Location: Office/Clinic  I discussed the limitations of evaluation and management by telemedicine. The patient expressed understanding and agreed to proceed.  Per patient no change in vitals since last visit, unable to obtain new vitals due to telehealth visit   Review of Systems     Cardiac Risk Factors include: advanced age (>71men, >59 women);hypertension;sedentary lifestyle     Objective:    Today's Vitals   09/06/22 0844  Weight: 190 lb (86.2 kg)  Height: 5\' 7"  (1.702 m)   Body mass index is 29.76 kg/m.     09/06/2022    8:51 AM 09/02/2021    3:51 PM 07/06/2019   10:31 AM 07/06/2019    1:35 AM 07/05/2019    8:47 PM 12/31/2017    2:56 PM 07/11/2017   11:14 AM  Advanced Directives  Does Patient Have a Medical Advance Directive? No Yes No No No Yes No  Type of Advance Directive  Living will       Does patient want to make changes to medical advance directive?  No - Patient declined    No - Patient declined   Would patient like information on creating a medical advance directive? No - Patient declined  No - Patient declined  No - Patient declined  No - Patient declined    Current Medications (verified) Outpatient Encounter Medications as of 09/06/2022  Medication Sig   buprenorphine (SUBUTEX) 8 MG SUBL SL tablet Place under the tongue.   cyclobenzaprine (FLEXERIL) 10 MG tablet Take 10 mg by mouth 3 (three) times daily as needed for muscle spasms.    famotidine (PEPCID) 20 MG tablet Take 20 mg by mouth 2 (two) times daily as needed for heartburn or indigestion.   fluticasone (FLONASE) 50 MCG/ACT nasal spray Place 2 sprays  into both nostrils daily.   mirtazapine (REMERON) 30 MG tablet TAKE ONE TABLET BY MOUTH AT BEDTIME FOR INSOMNIA   QUEtiapine (SEROQUEL) 25 MG tablet Take 1-2 tabs at night as needed for sleep.   sertraline (ZOLOFT) 50 MG tablet 50mg  taken by mouth daily.   amoxicillin (AMOXIL) 500 MG capsule Take 2 capsules (1,000 mg total) by mouth 2 (two) times daily. (Patient not taking: Reported on 09/06/2022)   No facility-administered encounter medications on file as of 09/06/2022.    Allergies (verified) Diclofenac sodium, Tramadol, Venlafaxine, Ambien [zolpidem], Gabapentin, and Metoprolol tartrate   History: Past Medical History:  Diagnosis Date   Arthritis    with longstanding foot and back pain after injuries, followed by Dr. Lajoyce Corners   Atypical chest pain    a. 06/2016 MV: EF 76%, HTN response, no ischemia/infarct (performed @ [facility]).   Cancer [facility])    cervical cancer   Depression    per Dr. Mila Homer with psych, h/o suicide attempt at 62 y/o   Dupuytren's contracture of left hand 2016   GERD (gastroesophageal reflux disease)    Impingement syndrome of left shoulder 06/16/2011   Migraine with aura    Orthostatic hypotension    Portal vein thrombosis    a. unclear etiology->on eliquis.   PSVT (paroxysmal supraventricular tachycardia)    a. 05/2017 2wk Event Monitor: Intermittent SVT,  fastest 190 bpm, longest 14 seconds. No significant bradycardia. Avg HR 81.   Suicidal overdose (HCC)    Syncope    a. 04/2017 Echo: EF 55-60%, Gr1 DD, no rwma, nl RV fxn.   Past Surgical History:  Procedure Laterality Date   ABDOMINAL HYSTERECTOMY  2000   TAH/BSO   APPENDECTOMY     COLONOSCOPY     FOOT SURGERY     R foot-great toe   MOHS SURGERY  07/2022   forehead   ORIF RADIAL FRACTURE Bilateral 03/13/2014   Procedure: OPEN REDUCTION INTERNAL FIXATION (ORIF) RADIAL FRACTURE;  Surgeon: Knute Neu, MD;  Location: [facility] OR;  Service: Plastics;  Laterality: Bilateral;   SHOULDER SURGERY     2013    TONSILLECTOMY     Family History  Problem Relation Age of Onset   Depression Mother    Colon cancer Mother    Kidney cancer Mother    Heart disease Father    Stroke Father    Depression Brother    Social History   Socioeconomic History   Marital status: Divorced    Spouse name: Not on file   Number of children: Not on file   Years of education: Not on file   Highest education level: Not on file  Occupational History   Not on file  Tobacco Use   Smoking status: Never   Smokeless tobacco: Never  Vaping Use   Vaping status: Never Used  Substance and Sexual Activity   Alcohol use: Yes    Alcohol/week: 0.0 standard drinks of alcohol    Comment: occassionally   Drug use: Yes    Frequency: 1.0 times per week    Types: Marijuana    Comment: Edibles    Sexual activity: Yes  Other Topics Concern   Not on file  Social History Narrative   3 kids, 1 with down's, 1 with asperger's   Separated as of 2019   Works at [employer], former Print production planner    Social Determinants of Health   Financial Resource Strain: Low Risk  (09/06/2022)   Overall Financial Resource Strain (CARDIA)    Difficulty of Paying Living Expenses: Not hard at all  Food Insecurity: No Food Insecurity (09/06/2022)   Hunger Vital Sign    Worried About Running Out of Food in the Last Year: Never true    Ran Out of Food in the Last Year: Never true  Transportation Needs: No Transportation Needs (09/06/2022)   PRAPARE - Administrator, Civil Service (Medical): No    Lack of Transportation (Non-Medical): No  Physical Activity: Inactive (09/06/2022)   Exercise Vital Sign    Days of Exercise per Week: 0 days    Minutes of Exercise per Session: 0 min  Stress: No Stress Concern Present (09/06/2022)   [employer] of Occupational Health - Occupational Stress Questionnaire    Feeling of Stress : Not at all  Social Connections: Moderately Integrated (09/06/2022)   Social Connection and Isolation Panel  [NHANES]    Frequency of Communication with Friends and Family: More than three times a week    Frequency of Social Gatherings with Friends and Family: More than three times a week    Attends Religious Services: More than 4 times per year    Active Member of [employer] or Organizations: Yes    Attends Engineer, structural: More than 4 times per year    Marital Status: Divorced    Tobacco Counseling Counseling given: Not Answered  Clinical Intake:  Pre-visit preparation completed: Yes  Pain : No/denies pain     BMI - recorded: 29.76 Nutritional Status: BMI 25 -29 Overweight Nutritional Risks: Unintentional weight gain (has gained 20lb in 1 mo) Diabetes: No  How often do you need to have someone help you when you read instructions, pamphlets, or other written materials from your doctor or pharmacy?: 1 - Never  Interpreter Needed?: No  Information entered by :: C.Yalitza Teed LPN   Activities of Daily Living    09/06/2022    8:53 AM  In your present state of health, do you have any difficulty performing the following activities:  Hearing? 1  Comment Tinnitus right ear  Vision? 0  Difficulty concentrating or making decisions? 0  Walking or climbing stairs? 0  Dressing or bathing? 0  Doing errands, shopping? 0  Preparing Food and eating ? N  Using the Toilet? N  In the past six months, have you accidently leaked urine? N  Do you have problems with loss of bowel control? N  Managing your Medications? N  Managing your Finances? N  Housekeeping or managing your Housekeeping? N    Patient Care Team: Joaquim Nam, MD as PCP - General (Family Medicine) Iran Ouch, MD as PCP - Cardiology (Cardiology) Knute Neu, MD as Consulting Physician (General Surgery) Care, [facility] (Pain Medicine)  Indicate any recent Medical Services you may have received from other than [facility] providers in the past year (date may be approximate).      Assessment:   This is a routine wellness examination for [place].  Hearing/Vision screen Hearing Screening - Comments:: Has tinnitus in right ear Vision Screening - Comments:: [facility] - [facility]  Dietary issues and exercise activities discussed:     Goals Addressed             This Visit's Progress    Patient Stated       Lose 40 pounds.       Depression Screen    09/12/2022    9:22 AM 09/06/2022    9:29 AM 05/12/2022   10:50 AM 09/02/2021    3:47 PM  PHQ 2/9 Scores  PHQ - 2 Score  0 0 0  PHQ- 9 Score   0   Exception Documentation --       Fall Risk    09/06/2022    8:52 AM 05/12/2022   10:50 AM 09/02/2021    3:50 PM  Fall Risk   Falls in the past year? 0 0 0  Number falls in past yr: 0 0 0  Injury with Fall? 0 0 0  Risk for fall due to : No Fall Risks No Fall Risks No Fall Risks  Follow up Falls prevention discussed;Falls evaluation completed Falls evaluation completed     MEDICARE RISK AT HOME:    TIMED UP AND GO:  Was the test performed?  No    Cognitive Function:        09/06/2022    8:54 AM 09/02/2021    3:51 PM  6CIT Screen  What Year? 0 points 0 points  What month? 0 points 0 points  What time? 0 points 0 points  Count back from 20 0 points 0 points  Months in reverse 0 points 0 points  Repeat phrase 0 points 0 points  Total Score 0 points 0 points    Immunizations Immunization History  Administered Date(s) Administered   Hep A /  Hep B 02/11/2002, 09/29/2002   Tdap 02/21/2007   Zoster Recombinant(Shingrix) 02/15/2022    TDAP status: Due, Education has been provided regarding the importance of this vaccine. Advised may receive this vaccine at local pharmacy or Health Dept. Aware to provide a copy of the vaccination record if obtained from local pharmacy or Health Dept. Verbalized acceptance and understanding.  Flu Vaccine status: Declined, Education has been provided regarding the importance of this vaccine but patient  still declined. Advised may receive this vaccine at local pharmacy or Health Dept. Aware to provide a copy of the vaccination record if obtained from local pharmacy or Health Dept. Verbalized acceptance and understanding.  Pneumococcal vaccine status: Declined,  Education has been provided regarding the importance of this vaccine but patient still declined. Advised may receive this vaccine at local pharmacy or Health Dept. Aware to provide a copy of the vaccination record if obtained from local pharmacy or Health Dept. Verbalized acceptance and understanding.   Covid-19 vaccine status: Declined, Education has been provided regarding the importance of this vaccine but patient still declined. Advised may receive this vaccine at local pharmacy or Health Dept.or vaccine clinic. Aware to provide a copy of the vaccination record if obtained from local pharmacy or Health Dept. Verbalized acceptance and understanding.  Qualifies for Shingles Vaccine? Yes   Zostavax completed No   Shingrix Completed?: Yes per pt vaccinated at [place] in [place].  Screening Tests Health Maintenance  Topic Date Due   COVID-19 Vaccine (1) Never done   HIV Screening  Never done   Hepatitis C Screening  Never done   PAP SMEAR-Modifier  Never done   MAMMOGRAM  04/06/2016   DTaP/Tdap/Td (2 - Td or Tdap) 02/20/2017   INFLUENZA VACCINE  09/21/2022   Medicare Annual Wellness (AWV)  09/06/2023   Colonoscopy  03/25/2027   Zoster Vaccines- Shingrix  Completed   HPV VACCINES  Aged Out    Health Maintenance  Health Maintenance Due  Topic Date Due   COVID-19 Vaccine (1) Never done   HIV Screening  Never done   Hepatitis C Screening  Never done   PAP SMEAR-Modifier  Never done   MAMMOGRAM  04/06/2016   DTaP/Tdap/Td (2 - Td or Tdap) 02/20/2017    Colorectal Cancer Screening - Pt declined  Mammogram status: No longer required due to Pt declined.   Lung Cancer Screening: (Low Dose CT Chest recommended if Age 62-80  years, 20 pack-year currently smoking OR have quit w/in 15years.) does not qualify.   Lung Cancer Screening Referral: n/a  Additional Screening:  Hepatitis C Screening: does qualify; Completed DUE  Vision Screening: Recommended annual ophthalmology exams for early detection of glaucoma and other disorders of the eye. Is the patient up to date with their annual eye exam?  Yes  Who is the provider or what is the name of the office in which the patient attends [facility] If pt is not established with a provider, would they like to be referred to a provider to establish care? No .   Dental Screening: Recommended annual dental exams for proper oral hygiene    Community Resource Referral / Chronic Care Management: CRR required this visit?  No   CCM required this visit?  No     Plan:     I have personally reviewed and noted the following in the patient's chart:   Medical and social history Use of alcohol, tobacco or illicit drugs  Current medications and supplements including opioid prescriptions.  Patient is not currently taking opioid prescriptions. Functional ability and status Nutritional status Physical activity Advanced directives List of other physicians Hospitalizations, surgeries, and ER visits in previous 12 months Vitals Screenings to include cognitive, depression, and falls Referrals and appointments  In addition, I have reviewed and discussed with patient certain preventive protocols, quality metrics, and best practice recommendations. A written personalized care plan for preventive services as well as general preventive health recommendations were provided to patient.     Maryan Puls, LPN   1/61/0960   After Visit Summary: (MyChart) Due to this being a telephonic visit, the after visit summary with patients personalized plan was offered to patient via MyChart   Nurse Notes: Vaccinations: declines  Influenza vaccine: recommend every  Fall Pneumococcal vaccine: recommend once per lifetime Prevnar-20 Covid-19: recommend 2 doses one month apart with a booster 6 months later

## 2022-09-18 ENCOUNTER — Ambulatory Visit: Payer: Medicare Other | Admitting: Dietician

## 2023-09-11 ENCOUNTER — Ambulatory Visit (INDEPENDENT_AMBULATORY_CARE_PROVIDER_SITE_OTHER): Payer: Medicare Other

## 2023-09-11 VITALS — Ht 66.0 in | Wt 190.0 lb

## 2023-09-11 DIAGNOSIS — Z0000 Encounter for general adult medical examination without abnormal findings: Secondary | ICD-10-CM

## 2023-09-11 NOTE — Progress Notes (Signed)
 Please attest and cosign this visit due to patients primary care provider not being in the office at the time the visit was completed.    Subjective:   April Stephenson is a 63 y.o. who presents for a Medicare Wellness preventive visit.  As a reminder, Annual Wellness Visits don't include a physical exam, and some assessments may be limited, especially if this visit is performed virtually. We may recommend an in-person follow-up visit with your provider if needed.  Visit Complete: Virtual I connected with  April Stephenson on 09/11/23 by a audio enabled telemedicine application and verified that I am speaking with the correct person using two identifiers.  Patient Location: Home  Provider Location: Office/Clinic  I discussed the limitations of evaluation and management by telemedicine. The patient expressed understanding and agreed to proceed.  Vital Signs: Because this visit was a virtual/telehealth visit, some criteria may be missing or patient reported. Any vitals not documented were not able to be obtained and vitals that have been documented are patient reported.  VideoDeclined- This patient declined Librarian, academic. Therefore the visit was completed with audio only.  Persons Participating in Visit: Patient.  AWV Questionnaire: No: Patient Medicare AWV questionnaire was not completed prior to this visit.  Cardiac Risk Factors include: advanced age (>43men, >77 women);dyslipidemia;hypertension;obesity (BMI >30kg/m2);sedentary lifestyle     Objective:    Today's Vitals   09/11/23 0814 09/11/23 0815  Weight: 190 lb (86.2 kg)   Height: 5' 6 (1.676 m)   PainSc:  3    Body mass index is 30.67 kg/m.     09/11/2023    8:28 AM 09/06/2022    8:51 AM 09/02/2021    3:51 PM 07/06/2019   10:31 AM 07/06/2019    1:35 AM 07/05/2019    8:47 PM 12/31/2017    2:56 PM  Advanced Directives  Does Patient Have a Medical Advance Directive? No No Yes No No No  Yes   Type of Advance Directive   Living will      Does patient want to make changes to medical advance directive?   No - Patient declined    No - Patient declined   Would patient like information on creating a medical advance directive?  No - Patient declined  No - Patient declined  No - Patient declined      Data saved with a previous flowsheet row definition    Current Medications (verified) Outpatient Encounter Medications as of 09/11/2023  Medication Sig   buprenorphine (SUBUTEX) 8 MG SUBL SL tablet Place under the tongue.   cyclobenzaprine  (FLEXERIL ) 10 MG tablet Take 10 mg by mouth 3 (three) times daily as needed for muscle spasms.    famotidine (PEPCID) 20 MG tablet Take 20 mg by mouth 2 (two) times daily as needed for heartburn or indigestion.   fluticasone  (FLONASE ) 50 MCG/ACT nasal spray Place 2 sprays into both nostrils daily.   mirtazapine (REMERON) 30 MG tablet TAKE ONE TABLET BY MOUTH AT BEDTIME FOR INSOMNIA   QUEtiapine  (SEROQUEL ) 25 MG tablet Take 1-2 tabs at night as needed for sleep.   sertraline  (ZOLOFT ) 50 MG tablet 50mg  taken by mouth daily.   amoxicillin  (AMOXIL ) 500 MG capsule Take 2 capsules (1,000 mg total) by mouth 2 (two) times daily. (Patient not taking: Reported on 09/11/2023)   No facility-administered encounter medications on file as of 09/11/2023.    Allergies (verified) Diclofenac  sodium, Tramadol , Venlafaxine, Ambien  [zolpidem ], Gabapentin, and Metoprolol  tartrate  History: Past Medical History:  Diagnosis Date   Arthritis    with longstanding foot and back pain after injuries, followed by Dr. Harden   Atypical chest pain    a. 06/2016 MV: EF 76%, HTN response, no ischemia/infarct (performed @ [facility]).   Cancer [facility])    cervical cancer   Depression    per Dr. Reino with psych, h/o suicide attempt at 63 y/o   Dupuytren's contracture of left hand 2016   GERD (gastroesophageal reflux disease)    Impingement syndrome of left shoulder  06/16/2011   Migraine with aura    Orthostatic hypotension    Portal vein thrombosis    a. unclear etiology->on eliquis .   PSVT (paroxysmal supraventricular tachycardia) (HCC)    a. 05/2017 2wk Event Monitor: Intermittent SVT, fastest 190 bpm, longest 14 seconds. No significant bradycardia. Avg HR 81.   Suicidal overdose (HCC)    Syncope    a. 04/2017 Echo: EF 55-60%, Gr1 DD, no rwma, nl RV fxn.   Past Surgical History:  Procedure Laterality Date   ABDOMINAL HYSTERECTOMY  2000   TAH/BSO   APPENDECTOMY     COLONOSCOPY     FOOT SURGERY     R foot-great toe   MOHS SURGERY  07/2022   forehead   ORIF RADIAL FRACTURE Bilateral 03/13/2014   Procedure: OPEN REDUCTION INTERNAL FIXATION (ORIF) RADIAL FRACTURE;  Surgeon: Balinda Rogue, MD;  Location: [facility] OR;  Service: Plastics;  Laterality: Bilateral;   SHOULDER SURGERY     2013   TONSILLECTOMY     Family History  Problem Relation Age of Onset   Depression Mother    Colon cancer Mother    Kidney cancer Mother    Heart disease Father    Stroke Father    Depression Brother    Social History   Socioeconomic History   Marital status: Divorced    Spouse name: Not on file   Number of children: Not on file   Years of education: Not on file   Highest education level: Not on file  Occupational History   Not on file  Tobacco Use   Smoking status: Never   Smokeless tobacco: Never  Vaping Use   Vaping status: Never Used  Substance and Sexual Activity   Alcohol use: Yes    Alcohol/week: 0.0 standard drinks of alcohol    Comment: occassionally   Drug use: Yes    Frequency: 1.0 times per week    Types: Marijuana    Comment: Edibles    Sexual activity: Yes  Other Topics Concern   Not on file  Social History Narrative   3 kids, 1 with down's, 1 with asperger's   Separated as of 2019   Works at [employer], former Print production planner    Social Drivers of Corporate investment banker Strain: Low Risk  (09/11/2023)   Overall Financial  Resource Strain (CARDIA)    Difficulty of Paying Living Expenses: Not hard at all  Food Insecurity: No Food Insecurity (09/11/2023)   Hunger Vital Sign    Worried About Running Out of Food in the Last Year: Never true    Ran Out of Food in the Last Year: Never true  Transportation Needs: No Transportation Needs (09/11/2023)   PRAPARE - Administrator, Civil Service (Medical): No    Lack of Transportation (Non-Medical): No  Physical Activity: Inactive (09/11/2023)   Exercise Vital Sign    Days of Exercise per Week: 0 days  Minutes of Exercise per Session: 0 min  Stress: No Stress Concern Present (09/11/2023)   [employer] of Occupational Health - Occupational Stress Questionnaire    Feeling of Stress: Not at all  Social Connections: Moderately Integrated (09/11/2023)   Social Connection and Isolation Panel    Frequency of Communication with Friends and Family: More than three times a week    Frequency of Social Gatherings with Friends and Family: More than three times a week    Attends Religious Services: More than 4 times per year    Active Member of [employer] or Organizations: Yes    Attends Engineer, structural: More than 4 times per year    Marital Status: Divorced    Tobacco Counseling Counseling given: Not Answered    Clinical Intake:  Pre-visit preparation completed: Yes  Pain : 0-10 Pain Score: 3  Pain Type: Chronic pain Pain Location: Back Pain Orientation: Lower Pain Descriptors / Indicators: Aching, Spasm, Sharp Pain Onset: More than a month ago Pain Frequency: Constant Pain Relieving Factors: exercises, medication Effect of Pain on Daily Activities: cannot do cleaning, lifting in home management  Pain Relieving Factors: exercises, medication  BMI - recorded: 30.67 Nutritional Status: BMI > 30  Obese Nutritional Risks: None Diabetes: No  No results found for: HGBA1C   How often do you need to have someone help you when you read  instructions, pamphlets, or other written materials from your doctor or pharmacy?: 1 - Never  Interpreter Needed?: No  Comments: lives alone Information entered by :: B.Peg Fifer,LPN   Activities of Daily Living     09/11/2023    8:30 AM  In your present state of health, do you have any difficulty performing the following activities:  Hearing? 1  Vision? 0  Difficulty concentrating or making decisions? 0  Walking or climbing stairs? 1  Dressing or bathing? 0  Doing errands, shopping? 0  Preparing Food and eating ? N  Using the Toilet? N  In the past six months, have you accidently leaked urine? N  Do you have problems with loss of bowel control? N  Managing your Medications? N  Managing your Finances? N  Housekeeping or managing your Housekeeping? N    Patient Care Team: Cleatus Arlyss RAMAN, MD as PCP - General (Family Medicine) Darron Deatrice LABOR, MD as PCP - Cardiology (Cardiology) Lorretta Dess, MD as Consulting Physician (General Surgery) Care, [facility] (Pain Medicine) [facility]  I have updated your Care Teams any recent Medical Services you may have received from other providers in the past year.     Assessment:   This is a routine wellness examination for [place].  Hearing/Vision screen Hearing Screening - Comments:: Pt says her hearing is good except tinnitus in rt ear Vision Screening - Comments:: Pt says her vision is good;readers only [facility]    Goals Addressed             This Visit's Progress    Patient Stated   Not on track    09/11/23-Lose 40 pounds.       Depression Screen     09/11/2023    8:25 AM 09/12/2022    9:22 AM 09/06/2022    9:29 AM 05/12/2022   10:50 AM 09/02/2021    3:47 PM  PHQ 2/9 Scores  PHQ - 2 Score 0  0 0 0  PHQ- 9 Score    0   Exception Documentation  --  Fall Risk     09/11/2023    8:20 AM 09/06/2022    8:52 AM 05/12/2022   10:50 AM 09/02/2021    3:50 PM  Fall Risk   Falls in the  past year? 0 0 0 0  Number falls in past yr: 0 0 0 0  Injury with Fall? 0 0 0 0  Risk for fall due to : No Fall Risks No Fall Risks No Fall Risks No Fall Risks  Follow up Falls prevention discussed;Education provided Falls prevention discussed;Falls evaluation completed Falls evaluation completed     MEDICARE RISK AT HOME:  Medicare Risk at Home Any stairs in or around the home?: Yes If so, are there any without handrails?: Yes Home free of loose throw rugs in walkways, pet beds, electrical cords, etc?: Yes Adequate lighting in your home to reduce risk of falls?: Yes Life alert?: No Use of a cane, walker or w/c?: No Grab bars in the bathroom?: No Shower chair or bench in shower?: No Elevated toilet seat or a handicapped toilet?: No  TIMED UP AND GO:  Was the test performed?  No  Cognitive Function: 6CIT completed        09/11/2023    8:33 AM 09/06/2022    8:54 AM 09/02/2021    3:51 PM  6CIT Screen  What Year? 0 points 0 points 0 points  What month? 0 points 0 points 0 points  What time? 0 points 0 points 0 points  Count back from 20 0 points 0 points 0 points  Months in reverse 0 points 0 points 0 points  Repeat phrase 0 points 0 points 0 points  Total Score 0 points 0 points 0 points    Immunizations Immunization History  Administered Date(s) Administered   Hep A / Hep B 02/11/2002, 09/29/2002   Tdap 02/21/2007   Zoster Recombinant(Shingrix) 02/15/2022    Screening Tests Health Maintenance  Topic Date Due   COVID-19 Vaccine (1) Never done   HIV Screening  Never done   Hepatitis C Screening  Never done   Hepatitis B Vaccines (3 of 3 - 19+ 3-dose series) 11/24/2002   MAMMOGRAM  04/06/2016   DTaP/Tdap/Td (2 - Td or Tdap) 02/20/2017   INFLUENZA VACCINE  09/21/2023   Medicare Annual Wellness (AWV)  09/10/2024   Colonoscopy  03/25/2027   Zoster Vaccines- Shingrix  Completed   HPV VACCINES  Aged Out   Meningococcal B Vaccine  Aged Out    Health  Maintenance  Health Maintenance Due  Topic Date Due   COVID-19 Vaccine (1) Never done   HIV Screening  Never done   Hepatitis C Screening  Never done   Hepatitis B Vaccines (3 of 3 - 19+ 3-dose series) 11/24/2002   MAMMOGRAM  04/06/2016   DTaP/Tdap/Td (2 - Td or Tdap) 02/20/2017   Health Maintenance Items Addressed: Pt says she had MMG at [facility] in Jan 2025 along with Hep C and HIV screenings w/in the last year  Vaccinations: pt declines: Influenza vaccine: recommend every Fall   Covid-19: recommend 2 doses one month apart with a booster 6 months later   Additional Screening:  Vision Screening: Recommended annual ophthalmology exams for early detection of glaucoma and other disorders of the eye. Would you like a referral to an eye doctor? No    Dental Screening: Recommended annual dental exams for proper oral hygiene  Community Resource Referral / Chronic Care Management: CRR required this visit?  No   CCM required this  visit?  No   Plan:    I have personally reviewed and noted the following in the patient's chart:   Medical and social history Use of alcohol, tobacco or illicit drugs  Current medications and supplements including opioid prescriptions. Patient is currently taking opioid prescriptions. Information provided to patient regarding non-opioid alternatives. Patient advised to discuss non-opioid treatment plan with their provider. Functional ability and status Nutritional status Physical activity Advanced directives List of other physicians Hospitalizations, surgeries, and ER visits in previous 12 months Vitals Screenings to include cognitive, depression, and falls Referrals and appointments  In addition, I have reviewed and discussed with patient certain preventive protocols, quality metrics, and best practice recommendations. A written personalized care plan for preventive services as well as general preventive health recommendations were provided to  patient.   Erminio LITTIE Saris, LPN   [id number]   After Visit Summary: (MyChart) Due to this being a telephonic visit, the after visit summary with patients personalized plan was offered to patient via MyChart   Notes: Nothing significant to report at this time.

## 2023-09-11 NOTE — Patient Instructions (Signed)
 April Stephenson , Thank you for taking time out of your busy schedule to complete your Annual Wellness Visit with me. I enjoyed our conversation and look forward to speaking with you again next year. I, as well as your care team,  appreciate your ongoing commitment to your health goals. Please review the following plan we discussed and let me know if I can assist you in the future. Your Game plan/ To Do List     Follow up Visits: Next Medicare AWV with our clinical staff: 09/11/24 @ 8:10am televisit   Have you seen your provider in the last 6 months (3 months if uncontrolled diabetes)? No Next Office Visit with your provider: not scheduled yet.the patient goes to [place] for PE and maintenance health  Clinician Recommendations:  Aim for 30 minutes of exercise or brisk walking, 6-8 glasses of water, and 5 servings of fruits and vegetables each day.       This is a list of the screening recommended for you and due dates:  Health Maintenance  Topic Date Due   COVID-19 Vaccine (1) Never done   HIV Screening  Never done   Hepatitis C Screening  Never done   Hepatitis B Vaccine (3 of 3 - 19+ 3-dose series) 11/24/2002   Mammogram  04/06/2016   DTaP/Tdap/Td vaccine (2 - Td or Tdap) 02/20/2017   Flu Shot  09/21/2023   Medicare Annual Wellness Visit  09/10/2024   Colon Cancer Screening  03/25/2027   Zoster (Shingles) Vaccine  Completed   HPV Vaccine  Aged Out   Meningitis B Vaccine  Aged Out    Advanced directives: (Provided) Advance directive discussed with you today. I have provided a copy for you to complete at home and have notarized. Once this is complete, please bring a copy in to our office so we can scan it into your chart.  Advance Care Planning is important because it:  [x]  Makes sure you receive the medical care that is consistent with your values, goals, and preferences  [x]  It provides guidance to your family and loved ones and reduces their decisional burden about whether or not they are  making the right decisions based on your wishes.  Follow the link provided in your after visit summary or read over the paperwork we have mailed to you to help you started getting your Advance Directives in place. If you need assistance in completing these, please reach out to us  so that we can help you!

## 2024-09-11 ENCOUNTER — Ambulatory Visit
